# Patient Record
Sex: Female | Born: 1965 | Race: Black or African American | Hispanic: No | Marital: Married | Smoking: Never smoker
Health system: Southern US, Community
[De-identification: ages and names within clinical notes are randomized; demographics above are authoritative.]

## PROBLEM LIST (undated history)

## (undated) DIAGNOSIS — I499 Cardiac arrhythmia, unspecified: Secondary | ICD-10-CM

## (undated) DIAGNOSIS — J45909 Unspecified asthma, uncomplicated: Secondary | ICD-10-CM

## (undated) DIAGNOSIS — M1990 Unspecified osteoarthritis, unspecified site: Secondary | ICD-10-CM

## (undated) DIAGNOSIS — M75100 Unspecified rotator cuff tear or rupture of unspecified shoulder, not specified as traumatic: Secondary | ICD-10-CM

## (undated) DIAGNOSIS — T884XXA Failed or difficult intubation, initial encounter: Secondary | ICD-10-CM

## (undated) HISTORY — DX: Unspecified asthma, uncomplicated: J45.909

## (undated) HISTORY — PX: CARPAL TUNNEL RELEASE: SHX101

## (undated) HISTORY — PX: TONSILLECTOMY: SUR1361

## (undated) HISTORY — PX: COLONOSCOPY: SHX174

## (undated) HISTORY — PX: CHOLECYSTECTOMY: SHX55

## (undated) HISTORY — DX: Unspecified rotator cuff tear or rupture of unspecified shoulder, not specified as traumatic: M75.100

---

## 2007-08-07 ENCOUNTER — Other Ambulatory Visit: Payer: Self-pay

## 2007-08-08 ENCOUNTER — Inpatient Hospital Stay: Payer: Self-pay | Admitting: Surgery

## 2007-08-08 ENCOUNTER — Other Ambulatory Visit: Payer: Self-pay

## 2007-10-12 ENCOUNTER — Emergency Department: Payer: Self-pay | Admitting: Emergency Medicine

## 2007-10-15 ENCOUNTER — Ambulatory Visit: Payer: Self-pay

## 2007-11-03 ENCOUNTER — Ambulatory Visit: Payer: Self-pay | Admitting: Internal Medicine

## 2009-08-03 ENCOUNTER — Ambulatory Visit: Payer: Self-pay

## 2010-06-13 ENCOUNTER — Emergency Department: Payer: Self-pay | Admitting: Unknown Physician Specialty

## 2010-08-01 ENCOUNTER — Ambulatory Visit: Payer: Self-pay

## 2011-08-13 ENCOUNTER — Ambulatory Visit: Payer: Self-pay | Admitting: General Practice

## 2011-09-25 ENCOUNTER — Ambulatory Visit: Payer: Self-pay | Admitting: Specialist

## 2011-10-02 ENCOUNTER — Ambulatory Visit: Payer: Self-pay | Admitting: Specialist

## 2011-10-02 LAB — PREGNANCY, URINE: Pregnancy Test, Urine: NEGATIVE m[IU]/mL

## 2011-11-27 ENCOUNTER — Ambulatory Visit: Payer: Self-pay | Admitting: Specialist

## 2011-11-27 LAB — PREGNANCY, URINE: Pregnancy Test, Urine: NEGATIVE m[IU]/mL

## 2012-06-12 ENCOUNTER — Emergency Department: Payer: Self-pay | Admitting: Emergency Medicine

## 2012-06-12 LAB — CBC
HCT: 38.6 % (ref 35.0–47.0)
HGB: 13 g/dL (ref 12.0–16.0)
MCH: 28.3 pg (ref 26.0–34.0)
MCHC: 33.8 g/dL (ref 32.0–36.0)
RDW: 13.5 % (ref 11.5–14.5)

## 2012-06-12 LAB — COMPREHENSIVE METABOLIC PANEL
Albumin: 3.4 g/dL (ref 3.4–5.0)
Anion Gap: 10 (ref 7–16)
BUN: 6 mg/dL — ABNORMAL LOW (ref 7–18)
Calcium, Total: 9.3 mg/dL (ref 8.5–10.1)
Co2: 26 mmol/L (ref 21–32)
Creatinine: 0.55 mg/dL — ABNORMAL LOW (ref 0.60–1.30)
EGFR (Non-African Amer.): >60
Glucose: 122 mg/dL — ABNORMAL HIGH (ref 65–99)
SGOT(AST): 23 U/L (ref 15–37)
Sodium: 139 mmol/L (ref 136–145)

## 2012-06-26 ENCOUNTER — Ambulatory Visit (INDEPENDENT_AMBULATORY_CARE_PROVIDER_SITE_OTHER): Payer: No Typology Code available for payment source | Admitting: Cardiovascular Disease

## 2012-06-26 ENCOUNTER — Encounter: Payer: Self-pay | Admitting: Cardiovascular Disease

## 2012-06-26 VITALS — BP 112/80 | HR 90 | Ht 63.0 in | Wt 229.5 lb

## 2012-06-26 VITALS — BP 120/70 | HR 100 | Ht 63.0 in | Wt 229.5 lb

## 2012-06-26 DIAGNOSIS — R079 Chest pain, unspecified: Secondary | ICD-10-CM

## 2012-06-26 DIAGNOSIS — R0602 Shortness of breath: Secondary | ICD-10-CM | POA: Insufficient documentation

## 2012-06-26 DIAGNOSIS — R000 Tachycardia, unspecified: Secondary | ICD-10-CM

## 2012-06-26 MED ORDER — METOPROLOL SUCCINATE ER 25 MG PO TB24: 25.0000 mg | ORAL_TABLET | Freq: Every day | ORAL | Status: DC

## 2012-06-26 NOTE — Patient Instructions (Signed)
Your physician has requested that you have an exercise tolerance test. For further information please visit [email or web address]. Please also follow instruction sheet, as given.  Your physician has requested that you have an echocardiogram. Echocardiography is a painless test that uses sound waves to create images of your heart. It provides your doctor with information about the size and shape of your heart and how well your heart's chambers and valves are working. This procedure takes approximately one hour. There are no restrictions for this procedure.   

## 2012-06-26 NOTE — Assessment & Plan Note (Signed)
Her chest pain is overall atypical. There might be a component of anxiety. I recommend a treadmill stress test for evaluation.

## 2012-06-26 NOTE — Assessment & Plan Note (Signed)
The patient's heart rate increased from 88 in the supine position to 108 in the standing position without any change in blood pressure. 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 I advised her to start an exercise program. I will also start her on small dose metoprolol extended release 25 mg once daily.

## 2012-06-26 NOTE — Assessment & Plan Note (Signed)
Patient complains of significant dyspnea. I suspect there is significant physical deconditioning. I will request an echocardiogram to evaluate LV systolic function and pulmonary pressure.

## 2012-06-26 NOTE — Patient Instructions (Addendum)
Your stress test is normal.  Start Toprol 25 mg once daily.  Start an exercise program 5 times a week for at least 30 minutes . Walking is ok to start with.  Follow up after echo.

## 2012-06-26 NOTE — Progress Notes (Signed)
HPI  This is a pleasant 46 year old [place] American female who is here today for cardiac evaluation after recent emergency room visit. She works as a Lawyer at [employer]. She has no previous cardiac history and no chronic medical conditions. She does not take any medications on a regular basis. Recently, she started having episodes of fast heartbeats the maximum heart rate reported of 130 beats per minute. This has been associated with dizziness and increased dyspnea. She also had substernal chest discomfort described as aching sensation happening at rest and lasting for a few minutes. She reports tachycardia with change in position associated with dizziness. She does not exercise on a regular basis. She is not a smoker and has no family history of premature coronary artery disease. She went to the emergency room at [facility] recently and was found to have sinus tachycardia with a heart rate of 112 beats per minute. Had basic labs were unremarkable except for hypokalemia with a potassium of 3. She reports frequent episodes of diarrhea. She might have irritable bowel syndrome. She reports no previous syncope.   No Known Allergies   No current outpatient prescriptions on file prior to visit.     Past Medical History  Diagnosis Date  . Asthma      Past Surgical History  Procedure Date  . Carpal tunnel release     x2  . Tonsillectomy   . Colonoscopy   . Cholecystectomy      Family History  Problem Relation Age of Onset  . Asthma Paternal Grandmother   . Heart attack Paternal Grandmother      History   Social History  . Marital Status: Single    Spouse Name: N/A    Number of Children: N/A  . Years of Education: N/A   Occupational History  . Not on file.   Social History Main Topics  . Smoking status: Never Smoker   . Smokeless tobacco: Not on file  . Alcohol Use: No  . Drug Use: No  . Sexually Active: Not on file   Other Topics Concern  . Not on file   Social History  Narrative  . No narrative on file     ROS Constitutional: Negative for fever, chills, diaphoresis, activity change, appetite change and fatigue.  HENT: Negative for hearing loss, nosebleeds, congestion, sore throat, facial swelling, drooling, trouble swallowing, neck pain, voice change, sinus pressure and tinnitus.  Eyes: Negative for photophobia, pain, discharge and visual disturbance.  Respiratory: Negative for apnea, cough and wheezing.  Cardiovascular: Negative for  leg swelling.  Gastrointestinal: Negative for nausea, vomiting, abdominal pain, diarrhea, constipation, blood in stool and abdominal distention.  Genitourinary: Negative for dysuria, urgency, frequency, hematuria and decreased urine volume.  Musculoskeletal: Negative for myalgias, back pain, joint swelling, arthralgias and gait problem.  Skin: Negative for color change, pallor, rash and wound.  Neurological: Negative for  tremors, seizures, syncope, speech difficulty, weakness, light-headedness, numbness and headaches.  Psychiatric/Behavioral: Negative for suicidal ideas, hallucinations, behavioral problems and agitation. The patient is not nervous/anxious.     PHYSICAL EXAM   BP 120/70  Pulse 100  Ht 5\' 3"  (1.6 m)  Wt 229 lb 8 oz (104.101 kg)  BMI 40.65 kg/m2 Constitutional: She is oriented to person, place, and time. She appears well-developed and well-nourished. No distress.  HENT: No nasal discharge.  Head: Normocephalic and atraumatic.  Eyes: Pupils are equal and round. Right eye exhibits no discharge. Left eye exhibits no discharge.  Neck: Normal range of  motion. Neck supple. No JVD present. No thyromegaly present.  Cardiovascular: Normal rate, regular rhythm, normal heart sounds. Exam reveals no gallop and no friction rub. No murmur heard.  Pulmonary/Chest: Effort normal and breath sounds normal. No stridor. No respiratory distress. She has no wheezes. She has no rales. She exhibits no tenderness.  Abdominal:  Soft. Bowel sounds are normal. She exhibits no distension. There is no tenderness. There is no rebound and no guarding.  Musculoskeletal: Normal range of motion. She exhibits no edema and no tenderness.  Neurological: She is alert and oriented to person, place, and time. Coordination normal.  Skin: Skin is warm and dry. No rash noted. She is not diaphoretic. No erythema. No pallor.  Psychiatric: She has a normal mood and affect. Her behavior is normal. Judgment and thought content normal.     EKG: Sinus  Rhythm  Low voltage in precordial leads.   -Poor R-wave progression -  ABNORMAL    ASSESSMENT AND PLAN

## 2012-06-26 NOTE — Procedures (Signed)
    Treadmill Stress test  Indication: Atypical chest pain.  Baseline Data:  Resting EKG shows NSR with rate of 102 bpm, no significant ST or T wave changes. Resting blood pressure of 112/80 mm Hg Stand bruce protocal was used.  Exercise Data:  Patient exercised for 3 min 42 sec,  Peak heart rate of 174 bpm.  This was 100 % of the maximum predicted heart rate. No symptoms of chest pain or lightheadedness were reported at peak stress or in recovery. She had significant dyspnea. Peak Blood pressure recorded was 146/78 Maximal work level: 4.6 METs.  Heart rate at 3 minutes in recovery was 109 bpm. BP response: Normal. HR response: Accelerated.  EKG with Exercise: Sinus tachycardia with no significant ST changes.  FINAL IMPRESSION: Normal exercise stress test. No significant EKG changes concerning for ischemia. Poor exercise tolerance. Accelerated heart rate response to exercise.  Recommendation: I recommend an exercise program to improve physical deconditioning and starting small dose metoprolol extended release 25 mg once daily.

## 2012-07-07 ENCOUNTER — Other Ambulatory Visit: Payer: No Typology Code available for payment source

## 2012-07-13 ENCOUNTER — Ambulatory Visit: Payer: No Typology Code available for payment source | Admitting: Cardiovascular Disease

## 2012-07-15 ENCOUNTER — Encounter: Payer: Self-pay | Admitting: Cardiovascular Disease

## 2012-07-21 ENCOUNTER — Other Ambulatory Visit (INDEPENDENT_AMBULATORY_CARE_PROVIDER_SITE_OTHER): Payer: No Typology Code available for payment source

## 2012-07-21 ENCOUNTER — Other Ambulatory Visit: Payer: Self-pay

## 2012-07-21 DIAGNOSIS — R000 Tachycardia, unspecified: Secondary | ICD-10-CM

## 2012-07-21 DIAGNOSIS — R0602 Shortness of breath: Secondary | ICD-10-CM

## 2012-07-28 ENCOUNTER — Ambulatory Visit: Payer: No Typology Code available for payment source | Admitting: Cardiovascular Disease

## 2012-08-14 ENCOUNTER — Encounter: Payer: Self-pay | Admitting: Cardiovascular Disease

## 2012-08-14 ENCOUNTER — Ambulatory Visit (INDEPENDENT_AMBULATORY_CARE_PROVIDER_SITE_OTHER): Payer: No Typology Code available for payment source | Admitting: Cardiovascular Disease

## 2012-08-14 VITALS — BP 118/54 | HR 102 | Ht 63.0 in | Wt 220.2 lb

## 2012-08-14 DIAGNOSIS — I272 Pulmonary hypertension, unspecified: Secondary | ICD-10-CM

## 2012-08-14 DIAGNOSIS — G90A Postural orthostatic tachycardia syndrome (POTS): Secondary | ICD-10-CM

## 2012-08-14 DIAGNOSIS — I951 Orthostatic hypotension: Secondary | ICD-10-CM

## 2012-08-14 DIAGNOSIS — I2789 Other specified pulmonary heart diseases: Secondary | ICD-10-CM

## 2012-08-14 DIAGNOSIS — R000 Tachycardia, unspecified: Secondary | ICD-10-CM

## 2012-08-14 DIAGNOSIS — R079 Chest pain, unspecified: Secondary | ICD-10-CM

## 2012-08-14 NOTE — Patient Instructions (Addendum)
Sleep study at [facility].  Start exercising.  Follow up in 6 months.

## 2012-08-16 DIAGNOSIS — G90A Postural orthostatic tachycardia syndrome (POTS): Secondary | ICD-10-CM | POA: Insufficient documentation

## 2012-08-16 DIAGNOSIS — I272 Pulmonary hypertension, unspecified: Secondary | ICD-10-CM | POA: Insufficient documentation

## 2012-08-16 NOTE — Assessment & Plan Note (Signed)
Symptoms improved with the addition of low-dose Toprol. I asked her to increase her fluid intake and avoid sudden standing up. I also recommended starting an exercise program to improve her overall conditioning.

## 2012-08-16 NOTE — Assessment & Plan Note (Signed)
Treadmill stress test showed no evidence of ischemia. She reports no further chest pain.

## 2012-08-16 NOTE — Progress Notes (Signed)
HPI  This is a pleasant 46 year old African American female who is here today for a followup visit regarding tachycardia, dizziness and atypical chest pain. She has no previous cardiac history and no chronic medical conditions. She does not take any medications on a regular basis. Recently, she started having episodes of fast heartbeats the maximum heart rate reported of 130 beats per minute. This has been associated with dizziness and increased dyspnea. She also had substernal chest discomfort described as aching sensation happening at rest and lasting for a few minutes. She reports tachycardia with change in position associated with dizziness. She does not exercise on a regular basis. She is not a smoker and has no family history of premature coronary artery disease. She went to the emergency room at [facility] recently and was found to have sinus tachycardia with a heart rate of 112 beats per minute. Had basic labs were unremarkable except for hypokalemia with a potassium of 3. She reports frequent episodes of diarrhea. She might have irritable bowel syndrome. She reports no previous syncope. During last visit, she was noted to have orthostatic tachycardia without a drop in blood pressure. She underwent a treadmill stress test which showed no evidence of ischemia but there was a very deconditioned heart rate response to exercise. I started her on metoprolol extended release 25 mg once daily. An echocardiogram showed normal LV systolic function with mild pulmonary hypertension. The patient reports symptoms highly suggestive of sleep apnea.   No Known Allergies   Current Outpatient Prescriptions on File Prior to Visit  Medication Sig Dispense Refill  . Cyanocobalamin (B-12 PO) Take by mouth as needed.      . metoprolol succinate (TOPROL XL) 25 MG 24 hr tablet Take 1 tablet (25 mg total) by mouth daily.  30 tablet  6     Past Medical History  Diagnosis Date  . Asthma      Past Surgical History    Procedure Date  . Carpal tunnel release     x2  . Tonsillectomy   . Colonoscopy   . Cholecystectomy      Family History  Problem Relation Age of Onset  . Asthma Paternal Grandmother   . Heart attack Paternal Grandmother      History   Social History  . Marital Status: Single    Spouse Name: N/A    Number of Children: N/A  . Years of Education: N/A   Occupational History  . Not on file.   Social History Main Topics  . Smoking status: Never Smoker   . Smokeless tobacco: Not on file  . Alcohol Use: No  . Drug Use: No  . Sexually Active: Not on file   Other Topics Concern  . Not on file   Social History Narrative  . No narrative on file       PHYSICAL EXAM   BP 118/54  Pulse 102  Ht 5\' 3"  (1.6 m)  Wt 220 lb 4 oz (99.905 kg)  BMI 39.02 kg/m2 Constitutional: She is oriented to person, place, and time. She appears well-developed and well-nourished. No distress.  HENT: No nasal discharge.  Head: Normocephalic and atraumatic.  Eyes: Pupils are equal and round. Right eye exhibits no discharge. Left eye exhibits no discharge.  Neck: Normal range of motion. Neck supple. No JVD present. No thyromegaly present.  Cardiovascular: Normal rate, regular rhythm, normal heart sounds. Exam reveals no gallop and no friction rub. No murmur heard.  Pulmonary/Chest: Effort normal and breath  sounds normal. No stridor. No respiratory distress. She has no wheezes. She has no rales. She exhibits no tenderness.  Abdominal: Soft. Bowel sounds are normal. She exhibits no distension. There is no tenderness. There is no rebound and no guarding.  Musculoskeletal: Normal range of motion. She exhibits no edema and no tenderness.  Neurological: She is alert and oriented to person, place, and time. Coordination normal.  Skin: Skin is warm and dry. No rash noted. She is not diaphoretic. No erythema. No pallor.  Psychiatric: She has a normal mood and affect. Her behavior is normal. Judgment  and thought content normal.     ASSESSMENT AND PLAN

## 2012-08-16 NOTE — Assessment & Plan Note (Signed)
Overall this was mild with an estimated pulmonary artery systolic pressure of 40 mm mercury. She has symptoms highly suggestive of sleep apnea and thus I will request a sleep study.

## 2012-08-25 ENCOUNTER — Telehealth: Payer: Self-pay

## 2012-08-25 NOTE — Telephone Encounter (Signed)
I attempted to reach pt to confirm compliance with sleep study that was ordered at [facility] I cannot find this documented in chart I attempted to call 1st # listed but pt has a "voice mailbox that has not been set up yet" I then tried 2nd # listed. 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

## 2012-08-27 NOTE — Telephone Encounter (Signed)
Pt says sleep lab told her she needs a "note from the doctor". I explained we sent order to them.  I will fax order again and they will be in contact with her. Understanding verb.

## 2012-09-01 ENCOUNTER — Telehealth: Payer: Self-pay

## 2012-09-01 NOTE — Telephone Encounter (Signed)
I called sleep lab at [facility] to check status of test/order that was faxed last week They tell me # they have listed has been disconnected I gave them # we have  They will try this # to reach pt to schedule sleep test

## 2012-09-03 NOTE — Telephone Encounter (Signed)
FYI

## 2012-09-03 NOTE — Telephone Encounter (Signed)
I spoke with [facility] sleep lab They tell me they have spoken to pt re: sleep study and pt says she "will call back after checking her schedule" I will make Dr. Kirke Corin aware

## 2012-09-16 ENCOUNTER — Ambulatory Visit: Payer: Self-pay | Admitting: Cardiovascular Disease

## 2014-06-01 ENCOUNTER — Encounter: Payer: Self-pay | Admitting: General Practice

## 2014-06-26 ENCOUNTER — Encounter: Payer: Self-pay | Admitting: General Practice

## 2014-09-28 ENCOUNTER — Emergency Department: Payer: Self-pay | Admitting: Emergency Medicine

## 2014-10-22 ENCOUNTER — Ambulatory Visit: Payer: Self-pay | Admitting: Specialist

## 2014-11-21 ENCOUNTER — Encounter
Admit: 2014-11-21 | Disposition: A | Discharge status: Home / Self Care | Payer: Self-pay | Attending: Specialist | Admitting: Specialist

## 2014-11-25 ENCOUNTER — Encounter
Admit: 2014-11-25 | Disposition: A | Discharge status: Home / Self Care | Payer: Self-pay | Attending: Specialist | Admitting: Specialist

## 2014-12-18 NOTE — Op Note (Signed)
PATIENT NAME:  Claudia KinsmanWARREN [name], Claudia [name] MR#:  [id number] DATE OF BIRTH:  [birth year]  DATE OF PROCEDURE:  11/27/2011  PREOPERATIVE DIAGNOSIS: Left carpal tunnel syndrome.   POSTOPERATIVE DIAGNOSIS: Left carpal tunnel syndrome.   PROCEDURE: Left carpal tunnel release.   SURGEON: Clare Gandyhristopher E. Rohnan Bartleson, MD    ANESTHESIA: General.   COMPLICATIONS: None.   TOURNIQUET TIME: Approximately 15 minutes.   PROCEDURE: After adequate induction of general anesthesia, the left upper extremity is thoroughly prepped with ChloraPrep and draped in standard sterile fashion. Extremity is wrapped out with the Esmarch bandage and pneumatic tourniquet elevated to 250 mmHg. Under loupe magnification, standard volar carpal tunnel incision is made and the dissection carried down to the transverse retinacular ligament. This is incised in the midportion and the proximal release is performed with the small scissors and the carpal tunnel scissors and the distal release is performed with the small scissors. Careful check is made both proximally and distally to ensure that complete release had been obtained. There is seen to be moderate compression of the median nerve directly beneath the transverse ligament. Wound is thoroughly irrigated multiple times. Skin edges are infiltrated with 0.5% plain Marcaine. Skin is closed with 4-0 nylon. Soft bulky dressing is applied. Tourniquet is released. The patient is returned to the recovery room in satisfactory condition having tolerated the procedure quite well.   ____________________________ Clare Gandyhristopher E. Elke Holtry, MD ces:drc D: 11/27/2011 08:06:23 ET T: 11/27/2011 10:33:05 ET JOB#: [id number]  cc: Clare Gandyhristopher E. Almee Pelphrey, MD, <Dictator> Clare GandyHRISTOPHER E Kwan Shellhammer MD ELECTRONICALLY SIGNED 11/28/2011 13:37

## 2014-12-18 NOTE — Op Note (Signed)
PATIENT NAME:  Claudia [name], Claudia [name] MR#:  [id number] DATE OF BIRTH:  Sep 14, 1965  DATE OF PROCEDURE:  10/02/2011  PREOPERATIVE DIAGNOSIS: Median and ulnar nerve impingement, right  wrist.   POSTOPERATIVE DIAGNOSIS: Median and ulnar nerve impingement, right wrist.   PROCEDURES:  1. Median nerve release, right wrist.  2. Ulnar nerve release, right wrist.   SURGEON: Myra Rudehristopher Sincere Berlanga, M.D.   ANESTHESIA: General.   COMPLICATIONS: None.   TOURNIQUET TIME: Approximately 25 minutes.   DESCRIPTION OF PROCEDURE: After adequate induction of general anesthesia, the right upper extremity is thoroughly prepped with alcohol and DuraPrep and draped in standard sterile fashion. The extremity is wrapped out with the Esmarch bandage and pneumatic tourniquet elevated to 250 mmHg. Under loupe magnification, [name] curved incision is made in the ulnar aspect of the palm almost to the volar wrist crease. The dissection is carefully carried down and the hook of the hamate is palpated. The dissection is first carried toward the thenar eminence and the transverse retinacular ligament is identified. This is incised under direct vision. Carpal tunnel scissors are used to complete the release proximally. There is seen to be severe compression of the median nerve directly beneath the ligament. Dissection is then turned ulnarly, Guyon's canal is identified, and the roof of the canal is completely released. There also is moderate compression of the neurovascular structures in this area. The motor branch is identified and also released at its entrance into the hypothenar space. The wound is thoroughly irrigated multiple times. Skin edges are infiltrated with 0.5% plain Marcaine. The skin is closed with 4-0 nylon. [name]       soft bulky dressing is applied. The tourniquet is released. The patient is returned to the Recovery Room in satisfactory condition having tolerated the procedure quite well.   ____________________________ Clare Gandyhristopher E. Tara Wich, MD ces:slb D: 10/02/2011 08:45:21 ET T: 10/02/2011 09:38:59 ET JOB#: [id number]  cc: Clare Gandyhristopher E. Tifini Reeder, MD, <Dictator> Clare GandyHRISTOPHER E Duston Smolenski MD ELECTRONICALLY SIGNED 10/03/2011 13:46

## 2015-08-09 ENCOUNTER — Other Ambulatory Visit: Payer: Self-pay | Admitting: Orthopedic Surgery

## 2015-08-09 DIAGNOSIS — M25512 Pain in left shoulder: Secondary | ICD-10-CM

## 2015-09-01 ENCOUNTER — Ambulatory Visit: Payer: 59

## 2015-09-01 ENCOUNTER — Ambulatory Visit: Payer: No Typology Code available for payment source

## 2015-09-26 ENCOUNTER — Other Ambulatory Visit: Payer: Self-pay | Admitting: Orthopedic Surgery

## 2015-09-26 ENCOUNTER — Ambulatory Visit
Admission: RE | Admit: 2015-09-26 | Discharge: 2015-09-26 | Disposition: A | Discharge status: Home / Self Care | Payer: 59 | Source: Ambulatory Visit | Attending: Orthopedic Surgery | Admitting: Orthopedic Surgery

## 2015-09-26 DIAGNOSIS — M67814 Other specified disorders of tendon, left shoulder: Secondary | ICD-10-CM | POA: Insufficient documentation

## 2015-09-26 DIAGNOSIS — M19012 Primary osteoarthritis, left shoulder: Secondary | ICD-10-CM | POA: Insufficient documentation

## 2015-09-26 DIAGNOSIS — S46812A Strain of other muscles, fascia and tendons at shoulder and upper arm level, left arm, initial encounter: Secondary | ICD-10-CM | POA: Insufficient documentation

## 2015-09-26 DIAGNOSIS — M25512 Pain in left shoulder: Secondary | ICD-10-CM | POA: Insufficient documentation

## 2015-09-26 DIAGNOSIS — M75102 Unspecified rotator cuff tear or rupture of left shoulder, not specified as traumatic: Secondary | ICD-10-CM | POA: Diagnosis not present

## 2015-09-26 MED ORDER — GADOBENATE DIMEGLUMINE 529 MG/ML IV SOLN
0.1000 mL | Freq: Once | INTRAVENOUS | Status: AC | PRN
  Administered 2015-09-26: 0.1 mL via INTRA_ARTICULAR

## 2015-09-26 MED ORDER — IOHEXOL 300 MG/ML  SOLN
10.0000 mL | Freq: Once | INTRAMUSCULAR | Status: AC | PRN
  Administered 2015-09-26: 10 mL via INTRA_ARTICULAR

## 2015-09-29 DIAGNOSIS — M75121 Complete rotator cuff tear or rupture of right shoulder, not specified as traumatic: Secondary | ICD-10-CM | POA: Diagnosis not present

## 2015-10-23 ENCOUNTER — Other Ambulatory Visit: Payer: Self-pay | Admitting: Orthopedic Surgery

## 2015-10-24 ENCOUNTER — Encounter: Payer: Self-pay | Admitting: *Deleted

## 2015-10-24 ENCOUNTER — Encounter
Admission: RE | Admit: 2015-10-24 | Discharge: 2015-10-24 | Disposition: A | Discharge status: Home / Self Care | Payer: 59 | Source: Ambulatory Visit | Attending: Orthopedic Surgery | Admitting: Orthopedic Surgery

## 2015-10-24 DIAGNOSIS — I472 Ventricular tachycardia: Secondary | ICD-10-CM | POA: Diagnosis not present

## 2015-10-24 DIAGNOSIS — Z01810 Encounter for preprocedural cardiovascular examination: Secondary | ICD-10-CM | POA: Insufficient documentation

## 2015-10-24 LAB — CBC WITH DIFFERENTIAL/PLATELET
BASOS PCT: 1 %
Basophils Absolute: 0 10*3/uL (ref 0–0.1)
EOS ABS: 0.1 10*3/uL (ref 0–0.7)
Eosinophils Relative: 2 %
HEMATOCRIT: 37 % (ref 35.0–47.0)
HEMOGLOBIN: 12.3 g/dL (ref 12.0–16.0)
LYMPHS PCT: 45 %
Lymphs Abs: 2.8 10*3/uL (ref 1.0–3.6)
MCH: 28.9 pg (ref 26.0–34.0)
MCHC: 33.3 g/dL (ref 32.0–36.0)
MCV: 86.9 fL (ref 80.0–100.0)
MONO ABS: 0.6 10*3/uL (ref 0.2–0.9)
MONOS PCT: 9 %
NEUTROS ABS: 2.7 10*3/uL (ref 1.4–6.5)
NEUTROS PCT: 43 %
Platelets: 231 10*3/uL (ref 150–440)
RBC: 4.26 MIL/uL (ref 3.80–5.20)
RDW: 14 % (ref 11.5–14.5)
WBC: 6.3 10*3/uL (ref 3.6–11.0)

## 2015-10-24 LAB — BASIC METABOLIC PANEL
ANION GAP: 6 (ref 5–15)
BUN: 7 mg/dL (ref 6–20)
CO2: 27 mmol/L (ref 22–32)
Calcium: 9.1 mg/dL (ref 8.9–10.3)
Chloride: 106 mmol/L (ref 101–111)
Creatinine, Ser: 0.54 mg/dL (ref 0.44–1.00)
GLUCOSE: 108 mg/dL — AB (ref 65–99)
POTASSIUM: 3.1 mmol/L — AB (ref 3.5–5.1)
Sodium: 139 mmol/L (ref 135–145)

## 2015-10-24 LAB — APTT: aPTT: 33 seconds (ref 24–36)

## 2015-10-24 LAB — PROTIME-INR
INR: 0.93
Prothrombin Time: 12.7 seconds (ref 11.4–15.0)

## 2015-10-24 NOTE — Pre-Procedure Instructions (Signed)
Spoke with Martie Lee @ Dr Sheilah Pigeon office and informed her that pt is needing cardiac clearance per anesthesia.  Pt had seen Dr Kirke Corin in 2013 but Martie Lee states that Dr Kirke Corin has a very busy schedule and she wants me to fax over the EKG to their office so she can show him the EKG and see where they need to place pt.  I faxed over EKG along with clearance sheet and received fax confirmation

## 2015-10-24 NOTE — Pre-Procedure Instructions (Signed)
Also called Cordelia Pen at Dr Justine Null office and left her a message that pt is needing cardiac clearance and that I am in the process of coordinating that with Dr Jari Sportsman office.

## 2015-10-24 NOTE — Pre-Procedure Instructions (Signed)
CALLED DR Henrene Hawking REGARDING ABNORMAL EKG-DR KEPHART WANTS PT TO GET CARDIAC CLEARANCE BEFORE HER SHOULDER SURGERY

## 2015-10-24 NOTE — Patient Instructions (Signed)
  Your procedure is scheduled on: 10-31-15 (TUESDAY) Report to [facility] SAME DAY SURGERY 2ND FLOOR To find out your arrival time please call [phone] between 1PM - 3PM on 10-30-15 (MONDAY)  Remember: Instructions that are not followed completely may result in serious medical risk, up to and including death, or upon the discretion of your surgeon and anesthesiologist your surgery may need to be rescheduled.    _X___ 1. Do not eat food or drink liquids after midnight. No gum chewing or hard candies.     _X___ 2. No Alcohol for 24 hours before or after surgery.   ____ 3. Bring all medications with you on the day of surgery if instructed.    _X___ 4. Notify your doctor if there is any change in your medical condition     (cold, fever, infections).     Do not wear jewelry, make-up, hairpins, clips or nail polish.  Do not wear lotions, powders, or perfumes. You may wear deodorant.  Do not shave 48 hours prior to surgery. Men may shave face and neck.  Do not bring valuables to the hospital.    [facility] is not responsible for any belongings or valuables.               Contacts, dentures or bridgework may not be worn into surgery.  Leave your suitcase in the car. After surgery it may be brought to your room.  For patients admitted to the hospital, discharge time is determined by your treatment team.   Patients discharged the day of surgery will not be allowed to drive home.   Please read over the following fact sheets that you were given:     ____ Take these medicines the morning of surgery with A SIP OF WATER:    1. NONE  2.   3.   4.  5.  6.  ____ Fleet Enema (as directed)   _X___ Use CHG Soap as directed  ____ Use inhalers on the day of surgery  ____ Stop metformin 2 days prior to surgery    ____ Take 1/2 of usual insulin dose the night before surgery and none on the morning of surgery.   ____ Stop Coumadin/Plavix/aspirin-N/A  ____ Stop Anti-inflammatories-NO  NSAIDS OR ASPIRIN PRODUCTS-TYLENOL OK TO CONTINUE   ____ Stop supplements until after surgery.    ____ Bring C-Pap to the hospital.

## 2015-10-24 NOTE — Pre-Procedure Instructions (Signed)
CALLED Claudia [name] AND LET HER KNOW THAT SHE HAS AN APPT WITH DR Kirke Corin ON Thursday 10-26-15 @ 11:45 AND SHE IS TO CHECK IN AT 11:30 SINCE SHE HAS NOT SEEN DR Kirke Corin SINCE 2013. NOTIFIED HER OF HER LOW K AND THAT SHE WILL BE GETTING A CALL FROM DR KS OFFICE TO START HER ON A SUPPLEMENT.  ALSO CALLED [name] AT DR Justine Null OFFICE AND NOTIFIED HER OF PTS CARDIOLOGY APPT ON 3-2 AND THAT PT HAS A LOW K=-FAXED ALL THIS INFO OVER TO [facility] WITH A FAX CONFIRMATION RECEIVED

## 2015-10-26 ENCOUNTER — Encounter: Payer: Self-pay | Admitting: Cardiovascular Disease

## 2015-10-26 ENCOUNTER — Telehealth: Payer: Self-pay | Admitting: Cardiovascular Disease

## 2015-10-26 ENCOUNTER — Ambulatory Visit (INDEPENDENT_AMBULATORY_CARE_PROVIDER_SITE_OTHER): Payer: 59 | Admitting: Cardiovascular Disease

## 2015-10-26 VITALS — BP 130/70 | HR 73 | Ht 63.0 in | Wt 239.2 lb

## 2015-10-26 DIAGNOSIS — Z01818 Encounter for other preprocedural examination: Secondary | ICD-10-CM | POA: Diagnosis not present

## 2015-10-26 NOTE — Progress Notes (Signed)
Cardiology Office Note   Date:  10/26/2015   ID:  Claudia [name], DOB 1966-06-29, MRN [id number]  PCP:  No PCP Per Patient  Cardiologist:   Lorine Bears, MD   Chief Complaint  Patient presents with  . Other    LS 2013 needs cardiac clearance for rotator cuff surgery c/o low potassium and abn ekg. Meds reviewed verbally with pt.      History of Present Illness: Claudia [name] is a 50 y.o. female who was referred by Dr. Martha Clan  for preoperative cardiovascular evaluation before shoulder surgery which is scheduled next week. 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 Workup in 2013 included a treadmill stress test which was normal. Echocardiogram showed normal LV systolic function with mild pulmonary hypertension.  She was treated with metoprolol with improvement in symptoms. She came off the medication after a few months with resolution of symptoms. She was noted recently to have a slightly abnormal EKG with anterior T wave changes and thus she was referred for preoperative cardiovascular evaluation. She denies any chest pain, orthopnea, PND or palpitations. She has chronic exertional dyspnea with no significant worsening recently. She is not a smoker and has no family history of coronary artery disease.  Past Medical History  Diagnosis Date  . Asthma     H/O YEARS AGO-NO INHALERS  . Arthritis   . Dysrhythmia     H/O [facility] PROBLEMS SINCE 2013  . Rotator cuff tear     left    Past Surgical History  Procedure Laterality Date  . Carpal tunnel release      x2  . Tonsillectomy    . Colonoscopy    . Cholecystectomy       Current Outpatient Prescriptions  Medication Sig Dispense Refill  . acetaminophen (TYLENOL) 500 MG tablet Take 500 mg by mouth every 6 (six) hours as needed.     No current facility-administered medications for this visit.    Allergies:   Review of  patient's allergies indicates no known allergies.    Social History:  The patient  reports that she has never smoked. She does not have any smokeless tobacco history on file. She reports that she does not drink alcohol or use illicit drugs.   Family History:  The patient's family history includes Asthma in her paternal grandmother; Heart attack in her paternal grandmother.    ROS:  Please see the history of present illness.   Otherwise, review of systems are positive for none.   All other systems are reviewed and negative.    PHYSICAL EXAM: VS:  BP 130/70 mmHg  Pulse 73  Ht  (1.6 m)  Wt 239 lb 4 oz (108.523 kg)  BMI 42.39 kg/m2  LMP 10/21/2015 , BMI Body mass index is 42.39 kg/(m^2). GEN: Well nourished, well developed, in no acute distress HEENT: normal Neck: no JVD, carotid bruits, or masses Cardiac: RRR; no murmurs, rubs, or gallops,no edema  Respiratory:  clear to auscultation bilaterally, normal work of breathing GI: soft, nontender, nondistended, + BS MS: no deformity or atrophy Skin: warm and dry, no rash Neuro:  Strength and sensation are intact Psych: euthymic mood, full affect   EKG:  EKG is ordered today. The ekg ordered today demonstrates normal sinus rhythm with minimal LVH criteria and nonspecific T wave changes in the anterior leads.   Recent Labs: 10/24/2015: BUN 7; Creatinine, Ser 0.54; Hemoglobin 12.3; Platelets  231; Potassium 3.1*; Sodium 139    Lipid Panel No results found for: CHOL, TRIG, HDL, CHOLHDL, VLDL, LDLCALC, LDLDIRECT    Wt Readings from Last 3 Encounters:  10/26/15 239 lb 4 oz (108.523 kg)  10/24/15 250 lb (113.399 kg)  08/14/12 220 lb 4 oz (99.905 kg)         ASSESSMENT AND PLAN:  1.  Preoperative cardiovascular evaluation: The patient currently has no convincing symptoms of angina, heart failure or arrhythmia. Her functional capacity is reasonable. Only symptoms include mild exertional dyspnea which has been chronic. EKG is  slightly abnormal and likely reflects some degree of left ventricular hypertrophy likely related to obesity. She did have mild pulmonary hypertension in 2013 and thus I requested an echocardiogram to ensure no worsening. If echocardiogram is unremarkable, the patient can proceed with surgery with with an overall low risk. I don't think she requires ischemic cardiac evaluation.  2.. Previous palpitations and sinus tachycardia: This has resolved completely and currently she is not on any medication.  3. The patient has been advised in the past to schedule a sleep study given symptoms of sleep apnea.      Disposition:   FU with me in as needed.   Signed, Lorine Bears, MD  10/26/2015 12:26 PM    [facility]

## 2015-10-26 NOTE — Patient Instructions (Signed)
Medication Instructions:  Your physician recommends that you continue on your current medications as directed. Please refer to the Current Medication list given to you today.   Labwork: none  Testing/Procedures: Your physician has requested that you have an echocardiogram. Echocardiography is a painless test that uses sound waves to create images of your heart. It provides your doctor with information about the size and shape of your heart and how well your heart's chambers and valves are working. This procedure takes approximately one hour. There are no restrictions for this procedure.    Follow-Up: Your physician recommends that you schedule a follow-up appointment as needed with Dr. Arida.    Any Other Special Instructions Will Be Listed Below (If Applicable).     If you need a refill on your cardiac medications before your next appointment, please call your pharmacy.  Echocardiogram An echocardiogram, or echocardiography, uses sound waves (ultrasound) to produce an image of your heart. The echocardiogram is simple, painless, obtained within a short period of time, and offers valuable information to your health care provider. The images from an echocardiogram can provide information such as:  Evidence of coronary artery disease (CAD).  Heart size.  Heart muscle function.  Heart valve function.  Aneurysm detection.  Evidence of a past heart attack.  Fluid buildup around the heart.  Heart muscle thickening.  Assess heart valve function. LET YOUR HEALTH CARE PROVIDER KNOW ABOUT:  Any allergies you have.  All medicines you are taking, including vitamins, herbs, eye drops, creams, and over-the-counter medicines.  Previous problems you or members of your family have had with the use of anesthetics.  Any blood disorders you have.  Previous surgeries you have had.  Medical conditions you have.  Possibility of pregnancy, if this applies. BEFORE THE PROCEDURE  No  special preparation is needed. Eat and drink normally.  PROCEDURE   In order to produce an image of your heart, gel will be applied to your chest and a wand-like tool (transducer) will be moved over your chest. The gel will help transmit the sound waves from the transducer. The sound waves will harmlessly bounce off your heart to allow the heart images to be captured in real-time motion. These images will then be recorded.  You may need an IV to receive a medicine that improves the quality of the pictures. AFTER THE PROCEDURE You may return to your normal schedule including diet, activities, and medicines, unless your health care provider tells you otherwise.   This information is not intended to replace advice given to you by your health care provider. Make sure you discuss any questions you have with your health care provider.   Document Released: 08/09/2000 Document Revised: 09/02/2014 Document Reviewed: 04/19/2013 Elsevier Interactive Patient Education 2016 [employer].  

## 2015-10-26 NOTE — Telephone Encounter (Signed)
Left detailed message regarding echo appt on pt cell VM

## 2015-10-26 NOTE — Telephone Encounter (Signed)
Pt seen in the office today. Needs echo for pre-operative clearance. Surgery scheduled for March 7 Scheduled at [facility] March 3, 10am, arrival 9:45am Left message on pt cell VM to call back.

## 2015-10-27 ENCOUNTER — Ambulatory Visit
Admission: RE | Admit: 2015-10-27 | Discharge: 2015-10-27 | Disposition: A | Discharge status: Home / Self Care | Payer: 59 | Source: Ambulatory Visit | Attending: Cardiovascular Disease | Admitting: Cardiovascular Disease

## 2015-10-27 ENCOUNTER — Telehealth: Payer: Self-pay | Admitting: Cardiovascular Disease

## 2015-10-27 DIAGNOSIS — Z01818 Encounter for other preprocedural examination: Secondary | ICD-10-CM

## 2015-10-27 DIAGNOSIS — Z01810 Encounter for preprocedural cardiovascular examination: Secondary | ICD-10-CM | POA: Diagnosis not present

## 2015-10-27 NOTE — Telephone Encounter (Signed)
Left message for Cordelia PenSherry at [facility]. Pt had echo today, awaiting results to be read.

## 2015-10-27 NOTE — Telephone Encounter (Signed)
Please Claudia [name] with Dr. Samuel GermanyKrasinski's office needs cardiac clearance stat?  Request for surgical clearance:  What type of surgery is being performed? L shoulder 1. When is this surgery scheduled? Tues. 10/31/15  Are there any medications that need to be held prior to surgery and how long? 2. Name of physician performing surgery? Dr. Samuel GermanyKrasinski's office  3. What is your office phone and fax number? [phone]  fax [phone] 4.

## 2015-10-27 NOTE — Progress Notes (Signed)
*  PRELIMINARY RESULTS* Echocardiogram 2D Echocardiogram has been performed.  Georgann HousekeeperJerry R Hege 10/27/2015, 10:28 AM

## 2015-10-30 ENCOUNTER — Other Ambulatory Visit: Payer: Self-pay | Admitting: Orthopedic Surgery

## 2015-10-30 NOTE — Pre-Procedure Instructions (Signed)
Kirke CorinArida, MD on 10/27/2015 at 6:40 PM Inform patient that echo was fine. She is at low risk for surgery.    PACS Images    Show images for Echocardiogram    Narrative            *[facility]*           [address]            [place], [name] [address]              [phone]  ------------------------------------------------------------------- Transthoracic Echocardiography  Patient:  Claudia [name], Claudia [name] MR #:    [id number] Study Date: 10/27/2015 Gender:   F Age:    50 Height:   160 cm Weight:   108.5 kg BSA:    2.26 m^2 Pt. Status: Room:  ATTENDING  Lorine BearsMuhammad Arida, MD ORDERING   Lorine BearsMuhammad Arida, MD SONOGRAPHER Cristela BlueJerry Hege RDCS PERFORMING  [facility]  cc:  ------------------------------------------------------------------- LV EF: 50% -  55%  ------------------------------------------------------------------- Indications:   Pre-operative cardiovascular exam V72.81.  ------------------------------------------------------------------- Study Conclusions  - Left ventricle: The cavity size was normal. There was mild concentric hypertrophy. Systolic function was normal. The estimated ejection fraction was in the range of 50% to 55%. Doppler parameters are consistent with abnormal left ventricular relaxation (grade 1 diastolic dysfunction). - Left atrium: The atrium was mildly dilated. - Pulmonary arteries: Systolic pressure could not be accurately estimated.  Impressions:  - suboptimal study due to poor windows.  Transthoracic echocardiography. M-mode, complete 2D, spectral Doppler, and color Doppler. Birthdate: Patient birthdate: 1966/08/02. Age: Patient is 50 yr old. Sex: Gender: female. BMI: 42.4 kg/m^2. Patient status: Outpatient. Study date: Study date: 10/27/2015. Study time: 09:42  AM.  -------------------------------------------------------------------  ------------------------------------------------------------------- Left ventricle: The cavity size was normal. There was mild concentric hypertrophy. Systolic function was normal. The estimated ejection fraction was in the range of 50% to 55%. Images were inadequate for LV wall motion assessment. Doppler parameters are consistent with abnormal left ventricular relaxation (grade 1 diastolic dysfunction).  ------------------------------------------------------------------- Aortic valve:  Trileaflet; normal thickness leaflets. Mobility was not restricted. Doppler: Transvalvular velocity was within the normal range. There was no stenosis. There was no regurgitation. Peak velocity ratio of LVOT to aortic valve: 0.87. Valve area (Vmax): 3.01 cm^2. Indexed valve area (Vmax): 1.33 cm^2/m^2. Peak gradient (S): 4 mm Hg.  ------------------------------------------------------------------- Aorta: Aortic root: The aortic root was normal in size.  ------------------------------------------------------------------- Mitral valve:  Structurally normal valve.  Mobility was not restricted. Doppler: Transvalvular velocity was within the normal range. There was no evidence for stenosis. There was no regurgitation.  Peak gradient (D): 2 mm Hg.  ------------------------------------------------------------------- Left atrium: The atrium was mildly dilated.  ------------------------------------------------------------------- Right ventricle: The cavity size was normal. Wall thickness was normal. Systolic function was normal.  ------------------------------------------------------------------- Pulmonic valve:  Doppler: Transvalvular velocity was within the normal range. There was no evidence for stenosis.  ------------------------------------------------------------------- Tricuspid valve:  Structurally normal  valve.  Doppler: Transvalvular velocity was within the normal range. There was no regurgitation.  ------------------------------------------------------------------- Pulmonary artery:  The main pulmonary artery was normal-sized. Systolic pressure could not be accurately estimated.  ------------------------------------------------------------------- Right atrium: The atrium was normal in size.  ------------------------------------------------------------------- Pericardium: There was no pericardial effusion.  ------------------------------------------------------------------- Systemic veins: Inferior vena cava: Not visualized.  ------------------------------------------------------------------- Measurements  Left ventricle              Value     Reference LV ID, ED, PLAX chordal  48  mm    43 - 52 LV ID, ES, PLAX chordal          34.2 mm    23 - 38 LV fx shortening, PLAX chordal      29  %    >=29 LV PW thickness, ED            11.9 mm    --------- IVS/LV PW ratio, ED            0.91      <=1.3 LV e&', lateral              8.38 cm/s   --------- LV E/e&', lateral             8.85      --------- LV e&', medial               7.07 cm/s   --------- LV E/e&', medial              10.5      --------- LV e&', average              7.73 cm/s   --------- LV E/e&', average             9.61      ---------  Ventricular septum            Value     Reference IVS thickness, ED             10.8 mm    ---------  LVOT                   Value     Reference LVOT ID, S                21  mm    --------- LVOT area                 3.46 cm^2   --------- LVOT peak velocity, S            85.4 cm/s   ---------  Aortic valve               Value     Reference Aortic valve peak velocity, S       98.2 cm/s   --------- Aortic peak gradient, S          4   mm Hg  --------- Velocity ratio, peak, LVOT/AV       0.87      --------- Aortic valve area, peak velocity     3.01 cm^2   --------- Aortic valve area/bsa, peak        1.33 cm^2/m^2 --------- velocity  Aorta                   Value     Reference Aortic root ID, ED            24  mm    ---------  Left atrium                Value     Reference LA ID, [name]-P, ES              34  mm    --------- LA ID/bsa, [name]-P              1.51 cm/m^2  <=2.2 LA volume, S               58.8 ml    ---------  LA volume/bsa, S             26.1 ml/m^2  --------- LA volume, ES, 1-p A4C          58.1 ml    --------- LA volume/bsa, ES, 1-p A4C        25.8 ml/m^2  --------- LA volume, ES, 1-p A2C          56.5 ml    --------- LA volume/bsa, ES, 1-p A2C        25  ml/m^2  ---------  Mitral valve               Value     Reference Mitral E-wave peak velocity        74.2 cm/s   --------- Mitral [name]-wave peak velocity        95.6 cm/s   --------- Mitral deceleration time         197  ms    150 - 230 Mitral peak gradient, D          2   mm Hg  --------- Mitral E/[name] ratio, peak          0.8      ---------  Right ventricle              Value     Reference RV ID, ED, PLAX              30.6 mm    19 - 38 TAPSE                   28  mm    ---------  Pulmonic valve              Value     Reference Pulmonic valve peak  velocity, S      58.2 cm/s   ---------  Legend: (L) and (H) mark values outside specified reference range.  ------------------------------------------------------------------- Prepared and Electronically Authenticated by  Lorine Bears, MD 2017-03-03T12:55:54    Congenital Measurements       Z-score normal range: +/-2   BSA formula: Ripley Fraise Documents:    There are no order-level documents.    Encounter-Level Documents - 10/27/2015:      Electronic signature on 10/27/2015 9:35 AM    Signed    Electronically signed by Iran Ouch, MD on 10/27/15 at 1255 EST    Printable Result Report    Result Report    Echocardiogram (Order [id number])  Echocardiography  Order: [id number]   Released By: Rockne Menghini  Authorizing: Iran Ouch, MD   Date: 10/27/2015  Department: [facility]       Procedure Abnormality Status    Echocardiogram      Order Information     Order Date/Time Release Date/Time Start Date/Time End Date/Time    10/27/15 09:38 AM 10/27/15 09:38 AM 10/27/15 09:38 AM 10/27/2015      Order Details     Frequency Duration Priority Order Class    As needed 1 occurrence Routine Ancillary Performed      Acc#    [id number]      Order Questions     Question Answer Comment    Where should this test be performed [facility]     Complete or Limited study? Complete     With Image Enhancing Agent or without Image Enhancing Agent? With Image Enhancing Agent     Note:  Select Without Image Enhancing Agent  only if contraindicated.    Expected Date: ASAP     Reason for exam-Echo Pre-operative cardiovascular examination V72.81 / Z01.810       Associated Diagnoses       ICD-9-CM ICD-10-CM    Preoperative clearance    V72.84 Z01.818      Order History  Inpatient    Date/Time Action Taken User Additional Information    0000 Result  Margreta Journey In process    10/27/15 1610 Release Rockne Menghini [id number]    10/27/15 1255 Result Rad Results In Interface Final      Verbal Order Info     Action Created on Order Mode Entered by Responsible Provider Signed by Signed on    Ordering 10/26/15 1155 Verbal with Read Back: Cosign Required Shon Baton, RN Iran Ouch, MD Iran Ouch, MD 10/27/15 0801      Order Requisition     Echocardiogram (Order #[id number]) on 10/27/15     Collection Information     Collected: 10/27/2015 9:42 AM   Resulting Agency: [facility]       View SmartLink Info     Echocardiogram (Order #[id number]) on 10/27/15

## 2015-10-30 NOTE — Pre-Procedure Instructions (Signed)
CARDIAC CLEARANCE RECEIVED FROM DR ARIDAS OFFICE AND WAS FAXED TO [name] AT [facility] WITH FAX CONFIRMATION RECEIVED

## 2015-10-30 NOTE — Telephone Encounter (Signed)
Clearance faxed to [facility], [phone] and [facility], [phone]

## 2015-10-31 ENCOUNTER — Ambulatory Visit: Payer: 59 | Admitting: Anesthesiology

## 2015-10-31 ENCOUNTER — Ambulatory Visit
Admission: RE | Admit: 2015-10-31 | Discharge: 2015-10-31 | Disposition: A | Discharge status: Home / Self Care | Payer: 59 | Source: Ambulatory Visit | Attending: Orthopedic Surgery | Admitting: Orthopedic Surgery

## 2015-10-31 ENCOUNTER — Encounter
Admission: RE | Disposition: A | Discharge status: Home / Self Care | Payer: Self-pay | Source: Ambulatory Visit | Attending: Orthopedic Surgery

## 2015-10-31 DIAGNOSIS — Z8249 Family history of ischemic heart disease and other diseases of the circulatory system: Secondary | ICD-10-CM | POA: Diagnosis not present

## 2015-10-31 DIAGNOSIS — Z7982 Long term (current) use of aspirin: Secondary | ICD-10-CM | POA: Diagnosis not present

## 2015-10-31 DIAGNOSIS — M66822 Spontaneous rupture of other tendons, left upper arm: Secondary | ICD-10-CM | POA: Diagnosis not present

## 2015-10-31 DIAGNOSIS — M1990 Unspecified osteoarthritis, unspecified site: Secondary | ICD-10-CM | POA: Insufficient documentation

## 2015-10-31 DIAGNOSIS — Z825 Family history of asthma and other chronic lower respiratory diseases: Secondary | ICD-10-CM | POA: Insufficient documentation

## 2015-10-31 DIAGNOSIS — S46812A Strain of other muscles, fascia and tendons at shoulder and upper arm level, left arm, initial encounter: Secondary | ICD-10-CM | POA: Insufficient documentation

## 2015-10-31 DIAGNOSIS — J45909 Unspecified asthma, uncomplicated: Secondary | ICD-10-CM | POA: Insufficient documentation

## 2015-10-31 DIAGNOSIS — Z79899 Other long term (current) drug therapy: Secondary | ICD-10-CM | POA: Insufficient documentation

## 2015-10-31 DIAGNOSIS — Z9049 Acquired absence of other specified parts of digestive tract: Secondary | ICD-10-CM | POA: Insufficient documentation

## 2015-10-31 DIAGNOSIS — M25512 Pain in left shoulder: Secondary | ICD-10-CM | POA: Diagnosis not present

## 2015-10-31 DIAGNOSIS — Y939 Activity, unspecified: Secondary | ICD-10-CM | POA: Insufficient documentation

## 2015-10-31 DIAGNOSIS — Y929 Unspecified place or not applicable: Secondary | ICD-10-CM | POA: Insufficient documentation

## 2015-10-31 DIAGNOSIS — Z5333 Arthroscopic surgical procedure converted to open procedure: Secondary | ICD-10-CM | POA: Diagnosis not present

## 2015-10-31 DIAGNOSIS — G8918 Other acute postprocedural pain: Secondary | ICD-10-CM | POA: Diagnosis not present

## 2015-10-31 DIAGNOSIS — S43082A Other subluxation of left shoulder joint, initial encounter: Secondary | ICD-10-CM | POA: Insufficient documentation

## 2015-10-31 DIAGNOSIS — X58XXXA Exposure to other specified factors, initial encounter: Secondary | ICD-10-CM | POA: Diagnosis not present

## 2015-10-31 DIAGNOSIS — M75122 Complete rotator cuff tear or rupture of left shoulder, not specified as traumatic: Secondary | ICD-10-CM | POA: Diagnosis not present

## 2015-10-31 DIAGNOSIS — Z6841 Body Mass Index (BMI) 40.0 and over, adult: Secondary | ICD-10-CM | POA: Insufficient documentation

## 2015-10-31 HISTORY — DX: Failed or difficult intubation, initial encounter: T88.4XXA

## 2015-10-31 HISTORY — DX: Unspecified osteoarthritis, unspecified site: M19.90

## 2015-10-31 HISTORY — DX: Cardiac arrhythmia, unspecified: I49.9

## 2015-10-31 HISTORY — PX: SHOULDER ARTHROSCOPY WITH OPEN ROTATOR CUFF REPAIR: SHX6092

## 2015-10-31 LAB — POCT I-STAT 4, (NA,K, GLUC, HGB,HCT)
Glucose, Bld: 104 mg/dL — ABNORMAL HIGH (ref 65–99)
HEMATOCRIT: 38 % (ref 36.0–46.0)
HEMOGLOBIN: 12.9 g/dL (ref 12.0–15.0)
Potassium: 3.7 mmol/L (ref 3.5–5.1)
SODIUM: 137 mmol/L (ref 135–145)

## 2015-10-31 LAB — BASIC METABOLIC PANEL
Anion gap: 8 (ref 5–15)
BUN: 6 mg/dL (ref 6–20)
CHLORIDE: 105 mmol/L (ref 101–111)
CO2: 24 mmol/L (ref 22–32)
CREATININE: 0.49 mg/dL (ref 0.44–1.00)
Calcium: 9 mg/dL (ref 8.9–10.3)
GFR calc Af Amer: >60 mL/min (ref >60)
GFR calc non Af Amer: >60 mL/min (ref >60)
GLUCOSE: 106 mg/dL — AB (ref 65–99)
Potassium: 3.7 mmol/L (ref 3.5–5.1)
Sodium: 137 mmol/L (ref 135–145)

## 2015-10-31 LAB — POCT PREGNANCY, URINE: Preg Test, Ur: NEGATIVE

## 2015-10-31 SURGERY — ARTHROSCOPY, SHOULDER WITH REPAIR, ROTATOR CUFF, OPEN
Anesthesia: General | Site: Shoulder | Laterality: Left | Wound class: Clean

## 2015-10-31 MED ORDER — IPRATROPIUM-ALBUTEROL 0.5-2.5 (3) MG/3ML IN SOLN
RESPIRATORY_TRACT | Status: AC
  Administered 2015-10-31: 3 mL via RESPIRATORY_TRACT
  Filled 2015-10-31: qty 3

## 2015-10-31 MED ORDER — ONDANSETRON HCL 4 MG/2ML IJ SOLN
INTRAMUSCULAR | Status: AC
  Filled 2015-10-31: qty 2

## 2015-10-31 MED ORDER — ACETAMINOPHEN 10 MG/ML IV SOLN
INTRAVENOUS | Status: AC
  Filled 2015-10-31: qty 100

## 2015-10-31 MED ORDER — GLYCOPYRROLATE 0.2 MG/ML IJ SOLN
INTRAMUSCULAR | Status: DC | PRN
  Administered 2015-10-31: 0.6 mg via INTRAVENOUS

## 2015-10-31 MED ORDER — FAMOTIDINE 20 MG PO TABS
20.0000 mg | ORAL_TABLET | Freq: Once | ORAL | Status: AC
  Administered 2015-10-31: 20 mg via ORAL

## 2015-10-31 MED ORDER — ROPIVACAINE HCL 5 MG/ML IJ SOLN
INTRAMUSCULAR | Status: DC | PRN
  Administered 2015-10-31: 5 mL via PERINEURAL
  Administered 2015-10-31: 10 mL via PERINEURAL
  Administered 2015-10-31: 5 mL via PERINEURAL

## 2015-10-31 MED ORDER — MIDAZOLAM HCL 2 MG/2ML IJ SOLN
INTRAMUSCULAR | Status: DC | PRN
  Administered 2015-10-31 (×2): 0.5 mg via INTRAVENOUS
  Administered 2015-10-31: 1 mg via INTRAVENOUS

## 2015-10-31 MED ORDER — NEOMYCIN-POLYMYXIN B GU 40-200000 IR SOLN
Status: AC
  Filled 2015-10-31: qty 2

## 2015-10-31 MED ORDER — EPINEPHRINE HCL 1 MG/ML IJ SOLN
INTRAMUSCULAR | Status: AC
  Filled 2015-10-31: qty 1

## 2015-10-31 MED ORDER — ONDANSETRON HCL 4 MG PO TABS: 4.0000 mg | ORAL_TABLET | Freq: Three times a day (TID) | ORAL | Status: AC | PRN

## 2015-10-31 MED ORDER — LIDOCAINE HCL (CARDIAC) 20 MG/ML IV SOLN
INTRAVENOUS | Status: DC | PRN
  Administered 2015-10-31: 30 mg via INTRAVENOUS

## 2015-10-31 MED ORDER — FENTANYL CITRATE (PF) 100 MCG/2ML IJ SOLN
25.0000 ug | INTRAMUSCULAR | Status: DC | PRN
  Administered 2015-10-31 (×4): 25 ug via INTRAVENOUS

## 2015-10-31 MED ORDER — LIDOCAINE HCL (PF) 1 % IJ SOLN
INTRAMUSCULAR | Status: AC
  Filled 2015-10-31: qty 30

## 2015-10-31 MED ORDER — ROCURONIUM BROMIDE 100 MG/10ML IV SOLN
INTRAVENOUS | Status: DC | PRN
  Administered 2015-10-31 (×2): 10 mg via INTRAVENOUS
  Administered 2015-10-31: 40 mg via INTRAVENOUS

## 2015-10-31 MED ORDER — FENTANYL CITRATE (PF) 100 MCG/2ML IJ SOLN
INTRAMUSCULAR | Status: AC
  Administered 2015-10-31: 25 ug via INTRAVENOUS
  Filled 2015-10-31: qty 2

## 2015-10-31 MED ORDER — PHENYLEPHRINE HCL 10 MG/ML IJ SOLN
INTRAMUSCULAR | Status: DC | PRN
  Administered 2015-10-31: 100 ug via INTRAVENOUS

## 2015-10-31 MED ORDER — CHLORHEXIDINE GLUCONATE 4 % EX LIQD
1.0000 | Freq: Once | CUTANEOUS | Status: DC
Start: 2015-10-31 — End: 2015-10-31

## 2015-10-31 MED ORDER — OXYCODONE HCL 5 MG PO TABS: 5.0000 mg | ORAL_TABLET | ORAL | Status: AC | PRN

## 2015-10-31 MED ORDER — SUCCINYLCHOLINE CHLORIDE 20 MG/ML IJ SOLN
INTRAMUSCULAR | Status: DC | PRN
  Administered 2015-10-31: 120 mg via INTRAVENOUS

## 2015-10-31 MED ORDER — IPRATROPIUM-ALBUTEROL 0.5-2.5 (3) MG/3ML IN SOLN
3.0000 mL | Freq: Once | RESPIRATORY_TRACT | Status: AC
  Administered 2015-10-31: 3 mL via RESPIRATORY_TRACT

## 2015-10-31 MED ORDER — ONDANSETRON HCL 4 MG/2ML IJ SOLN
INTRAMUSCULAR | Status: DC | PRN
Start: 2015-10-31 — End: 2015-10-31
  Administered 2015-10-31: 4 mg via INTRAVENOUS

## 2015-10-31 MED ORDER — NEOMYCIN-POLYMYXIN B GU 40-200000 IR SOLN
Status: DC | PRN
  Administered 2015-10-31: 2 mL

## 2015-10-31 MED ORDER — BUPIVACAINE HCL (PF) 0.25 % IJ SOLN
INTRAMUSCULAR | Status: AC
  Filled 2015-10-31: qty 30

## 2015-10-31 MED ORDER — CEFAZOLIN SODIUM-DEXTROSE 2-3 GM-% IV SOLR
INTRAVENOUS | Status: AC
  Filled 2015-10-31: qty 50

## 2015-10-31 MED ORDER — PROPOFOL 10 MG/ML IV BOLUS
INTRAVENOUS | Status: DC | PRN
  Administered 2015-10-31: 30 mg via INTRAVENOUS
  Administered 2015-10-31: 150 mg via INTRAVENOUS

## 2015-10-31 MED ORDER — BUPIVACAINE HCL 0.25 % IJ SOLN
INTRAMUSCULAR | Status: DC | PRN
  Administered 2015-10-31: 30 mL

## 2015-10-31 MED ORDER — LIDOCAINE HCL 1 % IJ SOLN
INTRAMUSCULAR | Status: DC | PRN
  Administered 2015-10-31: 14 mL

## 2015-10-31 MED ORDER — OXYCODONE HCL 5 MG PO TABS
ORAL_TABLET | ORAL | Status: AC
  Filled 2015-10-31: qty 1

## 2015-10-31 MED ORDER — EPINEPHRINE HCL 1 MG/ML IJ SOLN
INTRAMUSCULAR | Status: DC | PRN
  Administered 2015-10-31: 8 mL

## 2015-10-31 MED ORDER — NEOMYCIN-POLYMYXIN B GU 40-200000 IR SOLN
Status: AC
  Filled 2015-10-31: qty 1

## 2015-10-31 MED ORDER — FENTANYL CITRATE (PF) 100 MCG/2ML IJ SOLN
INTRAMUSCULAR | Status: DC | PRN
  Administered 2015-10-31 (×2): 50 ug via INTRAVENOUS

## 2015-10-31 MED ORDER — CEFAZOLIN SODIUM-DEXTROSE 2-3 GM-% IV SOLR
2.0000 g | INTRAVENOUS | Status: AC
  Administered 2015-10-31: 2 g via INTRAVENOUS

## 2015-10-31 MED ORDER — BUPIVACAINE-EPINEPHRINE (PF) 0.25% -1:200000 IJ SOLN
INTRAMUSCULAR | Status: AC
  Filled 2015-10-31: qty 30

## 2015-10-31 MED ORDER — LIDOCAINE HCL (PF) 2 % IJ SOLN
INTRAMUSCULAR | Status: DC | PRN
  Administered 2015-10-31: 1 mL via INTRADERMAL

## 2015-10-31 MED ORDER — ONDANSETRON HCL 4 MG/2ML IJ SOLN
4.0000 mg | Freq: Once | INTRAMUSCULAR | Status: AC
  Administered 2015-10-31: 4 mg via INTRAVENOUS

## 2015-10-31 MED ORDER — PHENYLEPHRINE 8 MG IN D5W 100 ML (0.08MG/ML) PREMIX OPTIME
INJECTION | INTRAVENOUS | Status: DC | PRN
  Administered 2015-10-31: 10 ug/min via INTRAVENOUS
  Administered 2015-10-31: 15 ug/min via INTRAVENOUS

## 2015-10-31 MED ORDER — OXYCODONE HCL 5 MG/5ML PO SOLN: 5.0000 mg | Freq: Once | ORAL | Status: AC | PRN

## 2015-10-31 MED ORDER — LACTATED RINGERS IV SOLN
INTRAVENOUS | Status: DC
  Administered 2015-10-31 (×2): via INTRAVENOUS

## 2015-10-31 MED ORDER — CHLORHEXIDINE GLUCONATE 4 % EX LIQD: 1.0000 "application " | Freq: Once | CUTANEOUS | Status: DC

## 2015-10-31 MED ORDER — NEOSTIGMINE METHYLSULFATE 10 MG/10ML IV SOLN
INTRAVENOUS | Status: DC | PRN
  Administered 2015-10-31: 3 mg via INTRAVENOUS

## 2015-10-31 MED ORDER — OXYCODONE HCL 5 MG PO TABS
5.0000 mg | ORAL_TABLET | Freq: Once | ORAL | Status: AC | PRN
  Administered 2015-10-31: 5 mg via ORAL

## 2015-10-31 MED ORDER — FAMOTIDINE 20 MG PO TABS
ORAL_TABLET | ORAL | Status: AC
  Administered 2015-10-31: 20 mg via ORAL
  Filled 2015-10-31: qty 1

## 2015-10-31 MED ORDER — ACETAMINOPHEN 10 MG/ML IV SOLN
INTRAVENOUS | Status: DC | PRN
  Administered 2015-10-31: 1000 mg via INTRAVENOUS

## 2015-10-31 MED ORDER — CEFAZOLIN SODIUM-DEXTROSE 2-3 GM-% IV SOLR
2.0000 g | INTRAVENOUS | Status: DC
Start: 2015-10-31 — End: 2015-10-31

## 2015-10-31 SURGICAL SUPPLY — 65 items
ADAPTER IRRIG TUBE 2 SPIKE SOL (ADAPTER) ×4 IMPLANT
ANCHOR ALL-SUT Q-FIX 2.8 (Anchor) ×2 IMPLANT
BUR RADIUS 4.0X18.5 (BURR) ×2 IMPLANT
BUR RADIUS 5.5 (BURR) ×2 IMPLANT
CANNULA 5.75X7 CRYSTAL CLEAR (CANNULA) IMPLANT
CANNULA PARTIAL THREAD 2X7 (CANNULA) IMPLANT
CANNULA TWIST IN 8.25X9CM (CANNULA) ×2 IMPLANT
CONNECTOR PERFECT PASSER (CONNECTOR) ×2 IMPLANT
COOLER POLAR GLACIER W/PUMP (MISCELLANEOUS) ×2 IMPLANT
CRADLE LAMINECT ARM (MISCELLANEOUS) ×2 IMPLANT
DRAPE IMP U-DRAPE 54X76 (DRAPES) ×4 IMPLANT
DRAPE INCISE IOBAN 66X45 STRL (DRAPES) ×2 IMPLANT
DRAPE U-SHAPE 47X51 STRL (DRAPES) ×2 IMPLANT
DURAPREP 26ML APPLICATOR (WOUND CARE) ×6 IMPLANT
ELECT REM PT RETURN 9FT ADLT (ELECTROSURGICAL) ×2
ELECTRODE REM PT RTRN 9FT ADLT (ELECTROSURGICAL) ×1 IMPLANT
GAUZE PETRO XEROFOAM 1X8 (MISCELLANEOUS) ×2 IMPLANT
GAUZE SPONGE 4X4 12PLY STRL (GAUZE/BANDAGES/DRESSINGS) ×4 IMPLANT
GLOVE BIOGEL PI IND STRL 9 (GLOVE) ×1 IMPLANT
GLOVE BIOGEL PI INDICATOR 9 (GLOVE) ×1
GLOVE SURG 9.0 ORTHO LTXF (GLOVE) ×4 IMPLANT
GOWN STRL REUS TWL 2XL XL LVL4 (GOWN DISPOSABLE) ×2 IMPLANT
GOWN STRL REUS W/ TWL LRG LVL3 (GOWN DISPOSABLE) ×1 IMPLANT
GOWN STRL REUS W/ TWL LRG LVL4 (GOWN DISPOSABLE) ×1 IMPLANT
GOWN STRL REUS W/TWL LRG LVL3 (GOWN DISPOSABLE) ×1
GOWN STRL REUS W/TWL LRG LVL4 (GOWN DISPOSABLE) ×1
IV LACTATED RINGER IRRG 3000ML (IV SOLUTION) ×8
IV LR IRRIG 3000ML ARTHROMATIC (IV SOLUTION) ×8 IMPLANT
KIT RM TURNOVER STRD PROC AR (KITS) ×2 IMPLANT
KIT STABILIZATION SHOULDER (MISCELLANEOUS) ×2 IMPLANT
KIT SUTURE 2.8 Q-FIX DISP (MISCELLANEOUS) ×2 IMPLANT
MANIFOLD NEPTUNE II (INSTRUMENTS) ×2 IMPLANT
MASK FACE SPIDER DISP (MASK) ×2 IMPLANT
MAT BLUE FLOOR 46X72 FLO (MISCELLANEOUS) ×4 IMPLANT
NDL SAFETY 18GX1.5 (NEEDLE) ×2 IMPLANT
NDL SAFETY 22GX1.5 (NEEDLE) ×2 IMPLANT
NS IRRIG 500ML POUR BTL (IV SOLUTION) ×2 IMPLANT
PACK ARTHROSCOPY SHOULDER (MISCELLANEOUS) ×2 IMPLANT
PAD WRAPON POLAR SHDR UNIV (MISCELLANEOUS) IMPLANT
PAD WRAPON POLAR SHDR XLG (MISCELLANEOUS) ×1 IMPLANT
PASSER SUT CAPTURE FIRST (SUTURE) ×2 IMPLANT
SET TUBE SUCT SHAVER OUTFL 24K (TUBING) ×2 IMPLANT
SET TUBE TIP INTRA-ARTICULAR (MISCELLANEOUS) ×2 IMPLANT
STRAP SAFETY BODY (MISCELLANEOUS) ×2 IMPLANT
STRIP CLOSURE SKIN 1/2X4 (GAUZE/BANDAGES/DRESSINGS) ×2 IMPLANT
SUT ETHILON 4-0 (SUTURE) ×1
SUT ETHILON 4-0 FS2 18XMFL BLK (SUTURE) ×1
SUT KNTLS 2.8 MAGNUM (Anchor) ×8 IMPLANT
SUT MNCRL 4-0 (SUTURE) ×1
SUT MNCRL 4-0 27XMFL (SUTURE) ×1
SUT PDS AB 0 CT1 27 (SUTURE) ×4 IMPLANT
SUT PERFECTPASSER WHITE CART (SUTURE) ×6 IMPLANT
SUT SMART STITCH CARTRIDGE (SUTURE) ×6 IMPLANT
SUT VIC AB 0 CT1 36 (SUTURE) ×4 IMPLANT
SUT VIC AB 2-0 CT2 27 (SUTURE) ×2 IMPLANT
SUTURE ETHLN 4-0 FS2 18XMF BLK (SUTURE) ×1 IMPLANT
SUTURE MAGNUM WIRE 2X48 BLK (SUTURE) IMPLANT
SUTURE MNCRL 4-0 27XMF (SUTURE) ×1 IMPLANT
SYRINGE 10CC LL (SYRINGE) ×2 IMPLANT
TAPE MICROFOAM 4IN (TAPE) ×2 IMPLANT
TUBING ARTHRO INFLOW-ONLY STRL (TUBING) ×2 IMPLANT
TUBING CONNECTING 10 (TUBING) ×2 IMPLANT
WAND HAND CNTRL MULTIVAC 90 (MISCELLANEOUS) ×2 IMPLANT
WRAPON POLAR PAD SHDR UNIV (MISCELLANEOUS)
WRAPON POLAR PAD SHDR XLG (MISCELLANEOUS) ×2

## 2015-10-31 NOTE — Anesthesia Procedure Notes (Addendum)
Anesthesia Regional Block:  Interscalene brachial plexus block  Pre-Anesthetic Checklist: ,, timeout performed, Correct Patient, Correct Site, Correct Laterality, Correct Procedure, Correct Position, site marked, Risks and benefits discussed,  Surgical consent,  Pre-op evaluation,  At surgeon's request and post-op pain management  Laterality: Upper and Left  Prep: chloraprep       Needles:  Injection technique: Single-shot  Needle Type: Stimiplex     Needle Length: 5cm 5 cm Needle Gauge: 22 and 22 G    Additional Needles:  Procedures: ultrasound guided (picture in chart) Interscalene brachial plexus block Narrative:  Start time: 10/31/2015 7:55 AM End time: 10/31/2015 7:58 AM Injection made incrementally with aspirations every 5 mL.  Performed by: Personally  Anesthesiologist: Margorie JohnPISCITELLO, JOSEPH K  Additional Notes: Functioning IV was confirmed and monitors were applied.  A 50mm 22ga Stimuplex needle was used. Sterile prep,hand hygiene and sterile gloves were used.  Negative aspiration and negative test dose prior to incremental administration of local anesthetic. The patient tolerated the procedure well with no immediate complications.   Procedure Name: Intubation Date/Time: 10/31/2015 8:05 AM Performed by: Charna BusmanIAMOND, THOMAS Pre-anesthesia Checklist: Patient identified, Emergency Drugs available, Suction available, Patient being monitored and Timeout performed Patient Re-evaluated:Patient Re-evaluated prior to inductionOxygen Delivery Method: Circle system utilized Preoxygenation: Pre-oxygenation with 100% oxygen Intubation Type: Combination inhalational/ intravenous induction and IV induction Ventilation: Mask ventilation without difficulty Laryngoscope Size: McGraph and 3 Grade View: Grade III Tube type: Oral Tube size: 7.0 mm Number of attempts: 2 Airway Equipment and Method: Stylet Placement Confirmation: ETT inserted through vocal cords under direct vision,  positive  ETCO2,  CO2 detector and breath sounds checked- equal and bilateral Secured at: 21 cm Tube secured with: Tape Dental Injury: Teeth and Oropharynx as per pre-operative assessment  Difficulty Due To: Difficulty was anticipated, Difficult Airway- due to reduced neck mobility and Difficult Airway- due to anterior larynx Future Recommendations: Recommend- induction with short-acting agent, and alternative techniques readily available

## 2015-10-31 NOTE — OR Nursing (Signed)
Dr Martha ClanKrasinski in to see patient clarified and reviewed consent with MD.

## 2015-10-31 NOTE — Anesthesia Postprocedure Evaluation (Signed)
Anesthesia Post Note  Patient: Claudia [name]  Procedure(s) Performed: Procedure(s) (LRB): SHOULDER ARTHROSCOPY, SUBACROMIAL DECOMPRESSION, BICEPS TENOTOMY AND MINI OPEN ROTATOR CUFF REPAIR (Left)  Patient location during evaluation: PACU Anesthesia Type: General Level of consciousness: awake and alert Pain management: pain level controlled Vital Signs Assessment: post-procedure vital signs reviewed and stable Respiratory status: spontaneous breathing, nonlabored ventilation, respiratory function stable and patient connected to nasal cannula oxygen Cardiovascular status: blood pressure returned to baseline and stable Postop Assessment: no signs of nausea or vomiting Anesthetic complications: no    Last Vitals:  Filed Vitals:   10/31/15 1445 10/31/15 1511  BP: 114/65 122/61  Pulse: 65 66  Temp:    Resp: 20 20    Last Pain:  Filed Vitals:   10/31/15 1513  PainSc: 6                  Jomarie LongsJoseph K Piscitello

## 2015-10-31 NOTE — Op Note (Signed)
10/31/2015  11:47 AM  PATIENT:  Claudia [name]  50 y.o. female  PRE-OPERATIVE DIAGNOSIS:  Subscapularis and possible supraspinatus tears of left shoulder and biceps tendon dislocation  POST-OPERATIVE DIAGNOSIS:  Full thickness tears of the subscapular and supraspinatus with biceps tendon subluxation  PROCEDURE:  Procedure(s): LEFT SHOULDER ARTHROSCOPY, SUBACROMIAL DECOMPRESSION, BICEPS TENOTOMY AND MINI OPEN ROTATOR CUFF REPAIR OF SUBSCAPULARIS AND SUPRASPINATUS THROUGH SEPARATE INCISIONS  SURGEON:  Surgeon(s) and Role:    * Thornton Park, MD - Primary  ANESTHESIA:   local, general and paracervical block   PREOPERATIVE INDICATIONS:  Claudia [name] is a  50 y.o. female with a diagnosis of full thickness tears of the subscapular and supraspinatus with biceps tendon subluxation, not trauma who failed conservative measures and elected for surgical management.    The risks benefits and alternatives were discussed with the patient preoperatively including but not limited to the risks of infection, bleeding, nerve injury, persistent pain or weakness, failure of the hardware, re-tear of the rotator cuff and the need for further surgery. Medical risks include DVT and pulmonary embolism, myocardial infarction, stroke, pneumonia, respiratory failure and death. Patient understood these risks and wished to proceed.  OPERATIVE IMPLANTS: ArthroCare Magnum 2 anchors x 4 & [employer] Q Fix anchors x 1  OPERATIVE PROCEDURE: The patient was met in the preoperative area. The left shoulder was signed with the word yes and my initials according the hospital's correct site of surgery protocol. The patient is brought to the OR and underwent a interscalene block and general endotracheal intubation by the anesthesia service.  The patient was placed in a beachchair position. A spider arm positioner was used for this case. Examination under anesthesia revealed no significant limitation to passive motion.  There was no instability to load shift testing..  The patient was prepped and draped in a sterile fashion. A timeout was performed to verify the patient's name, date of birth, medical record number, correct site of surgery and correct procedure to be performed there was also used to verify the patient received antibiotics that all appropriate instruments, implants and radiographs studies were available in the room. Once all in attendance were in agreement case began.  Bony landmarks were drawn out with a surgical marker along with proposed arthroscopy incisions. These were pre-injected with 1% lidocaine plain. An 11 blade was used to establish a posterior portal through which the arthroscope was placed in the glenohumeral joint. A full diagnostic examination of the shoulder was performed. An anterior lateral portal was established under direct visualization with an 18-gauge spinal needle.  A 5.75 mm arthroscopic cannula was placed through the anterolateral portal.   The intra-articular portion of the biceps tendon was found to have a high-grade partial tear involving greater than 50% of the diameter and was subluxated medially out of the intertuberous groove.. Therefore the decision was made to perform a tenotomy. An arthroscopic scissor was used to release the biceps tendon off the superior labrum. The arthroscopic shaver was then used to debride the biceps stump. There were no anterior or superior labral tears seen.    The superior half of the subscapularis tendon was torn but not significantly retracted from the lesser tuberosity. Subscapularis was mobilized with an arthroscopic elevator and 4 resector shaver blade. A 5.75 mm arthroscopic cannula was then replaced with an 8 mm 1. Through this cannula and ArthroCare perfect Pass suture was placed in the superior subscapularis.   The patient had fraying of the supraspinatus from the  glenohumeral joint. The 0 PDS suture was placed through the frayed  aspect of the supraspinatus for identification from the bursal side. There were no loose bodies within the inferior recess and no evidence of HAGL lesion.  The arthroscope was then placed in the subacromial space. A lateral portal was then established using an 18-gauge spinal needle for localization.  The 0 PDS suture was identified. This demonstrated a near full-thickness tear of the supraspinatus. The 4.77m resector shaver blade was then used to complete the tear. The greater tuberosity was debrided using a 4.0 mm resector shaver blade to remove all remaining fibers of the rotator cuff.  Debridement was performed until punctate bleeding was seen at the greater tuberosity footprint, which will allow for rotator cuff healing.  . A second perfect Pass suture was placed in the lateral rotator cuff.   The bursitis encountered in the subacromial space debrided using a 4-0 resector shaver blade and a 90 ArthroCare wand from the lateral portal. A subacromial decompression was also performed using a 5.5 mm resector shaver blade from the lateral portal All arthroscopic instruments were then removed after lavaging the subacromial space of all bone debris and the mini-open portion of the procedure began.   Internal portal was elongated. The suture placed in the subscapularis tendon was then identified and brought through the anterior deltoid split. A second perfect Pass suture was placed inferior to the first suture. The lesser tuberosity was identified and debrided of overlying torn fibers of the subscapularis using a 4.0 resector and arthroscopic rasp.  2 Magnum 2 anchors were then used to repair the rotator cuff. The perfect Pass sutures were loaded into the Magnum 2 anchors prior to deployment. These were then tensioned to allow for anatomic reduction to the lesser tuberosity.  A saber-type incision was then made along the lateral border of the acromion. The deltoid muscle was identified and split in line with its  fibers which allowed visualization of the rotator cuff. The Perfect Pass stitches previously placed in the lateral border of the rotator cuff werealso brought out through the deltoid split. A single Q-Fix anchor was then placed at the articular margin of the humeral head and greater tuberosity. The four suture limbs of both Q Fix anchors were passed medially through the rotator cuff using a first pass suture passer. The Perfect Pass stitches from the lateral border of the rotator cuff were then anchored to thegreater tuberosity of the humeral head using two Magnum 2 anchors. These anchors were tensioned to allow for anatomic reduction of the rotator cuff to the greater tuberosity footprint. The medial row repair was then completed using an arthroscopic knot tying technique with the Q fix anchor sutures. Once all sutures were tied down, arthroscopic images of the double row repair were taken with the arthroscope both externally and arthroscopically fromthe glenohumeral joint.  All incisions were copiously irrigated. The deltoid fascia was repaired using a 0 Vicryl suture in an interrupted fashion. The subcutaneous tissue of all incisions were closed with a 2-0 Vicryl. Skin closure for the arthroscopic incisions was performed with 4-0 nylon. The skin edges of the saber incision were approximated with a running 4-0 undyed Monocryl. A dry sterile dressing was applied. The patient was placed in an abduction sling, with a Polar Care sleeve, and TENS unit leads were applied.  .  All sharp and instrument counts were correct at the conclusion of the case. I was scrubbed and present for the entire case. I spoke with  the patient's family, including her daughter who is a Marine scientist, in the postoperative consultation room to let them know the case had been performed without complication and the patient was stable in recovery room.      Timoteo Gaul, MD

## 2015-10-31 NOTE — Progress Notes (Signed)
  Subjective:  Post- op  Check:  Patient reports pain as moderate.  No nausea and vomiting.  Objective:   VITALS:   Filed Vitals:   10/31/15 1324 10/31/15 1339 10/31/15 1354 10/31/15 1400  BP: 100/62 108/84 112/86   Pulse: 73 63 68 73  Temp:   97.2 F (36.2 C)   TempSrc:      Resp: 24 22 18 22   Height:      Weight:      SpO2: 95% 94% 94% 95%    PHYSICAL EXAM:  Left upper extremity:  Dressing CDI.  Patient has intact sensation to light touch in all 5 fingers and can flex and extend all digits.  Sling in place.    LABS  Results for orders placed or performed during the hospital encounter of 10/31/15 (from the past 24 hour(s))  Pregnancy, urine POC     Status: None   Collection Time: 10/31/15  6:11 AM  Result Value Ref Range   Preg Test, Ur NEGATIVE NEGATIVE  Basic metabolic panel     Status: Abnormal   Collection Time: 10/31/15  6:36 AM  Result Value Ref Range   Sodium 137 135 - 145 mmol/L   Potassium 3.7 3.5 - 5.1 mmol/L   Chloride 105 101 - 111 mmol/L   CO2 24 22 - 32 mmol/L   Glucose, Bld 106 (H) 65 - 99 mg/dL   BUN 6 6 - 20 mg/dL   Creatinine, Ser 8.460.49 0.44 - 1.00 mg/dL   Calcium 9.0 8.9 - 96.210.3 mg/dL   GFR calc non Af Amer >60 >60 mL/min   GFR calc Af Amer >60 >60 mL/min   Anion gap 8 5 - 15  I-STAT 4, (NA,K, GLUC, HGB,HCT)     Status: Abnormal   Collection Time: 10/31/15  6:52 AM  Result Value Ref Range   Sodium 137 135 - 145 mmol/L   Potassium 3.7 3.5 - 5.1 mmol/L   Glucose, Bld 104 (H) 65 - 99 mg/dL   HCT 95.238.0 84.136.0 - 32.446.0 %   Hemoglobin 12.9 12.0 - 15.0 g/dL    No results found.  Assessment/Plan: Day of Surgery   Patient stable post-op.  She will be discharged home today and follow up with me in 7-10 days.      Juanell FairlyKRASINSKI, Keniya Schlotterbeck , MD 10/31/2015, 2:15 PM

## 2015-10-31 NOTE — Anesthesia Preprocedure Evaluation (Signed)
Anesthesia Evaluation  Patient identified by MRN, date of birth, ID band Patient awake    Reviewed: Allergy & Precautions, H&P , NPO status , Patient's Chart, lab work & pertinent test results  History of Anesthesia Complications Negative for: history of anesthetic complications  Airway Mallampati: III  TM Distance: >3 FB Neck ROM: limited    Dental  (+) Poor Dentition, Chipped   Pulmonary neg shortness of breath, asthma ,    Pulmonary exam normal breath sounds clear to auscultation       Cardiovascular Exercise Tolerance: Good (-) angina(-) Past MI and (-) DOE Normal cardiovascular exam+ dysrhythmias  Rhythm:regular Rate:Normal     Neuro/Psych negative neurological ROS  negative psych ROS   GI/Hepatic negative GI ROS, Neg liver ROS,   Endo/Other  Morbid obesity  Renal/GU negative Renal ROS  negative genitourinary   Musculoskeletal  (+) Arthritis ,   Abdominal   Peds  Hematology negative hematology ROS (+)   Anesthesia Other Findings Past Medical History:   Asthma                                                         Comment:H/O YEARS AGO-NO INHALERS   Arthritis                                                    [facility]                                                    [facility]   Rotator cuff tear                                              Comment:left  Past Surgical History:   CARPAL TUNNEL RELEASE                                           Comment:x2   TONSILLECTOMY                                                 COLONOSCOPY                                                   CHOLECYSTECTOMY                                              BMI  Body Mass Index   41.63 kg/m 2      Reproductive/Obstetrics negative OB ROS                             Anesthesia Physical Anesthesia Plan  ASA: III  Anesthesia Plan: General ETT    Post-op Pain Management: MAC Combined w/ Regional for Post-op pain   Induction:   Airway Management Planned:   Additional Equipment:   Intra-op Plan:   Post-operative Plan:   Informed Consent: I have reviewed the patients History and Physical, chart, labs and discussed the procedure including the risks, benefits and alternatives for the proposed anesthesia with the patient or authorized representative who has indicated his/her understanding and acceptance.   Dental Advisory Given  Plan Discussed with: Anesthesiologist, CRNA and Surgeon  Anesthesia Plan Comments:         Anesthesia Quick Evaluation

## 2015-10-31 NOTE — H&P (Signed)
PREOPERATIVE H&P  Chief Complaint: Left shoulder  HPI: Claudia [name] is a 50 y.o. female who presents for preoperative history and physical with a diagnosis of left shoulder subscapularis tear, biceps dislocation and possible supraspinatus tear confirmed by MRI with persistent pain and weakness in the left shoulder affecting her ability to perform ADLs and at her job. Symptoms are rated as moderate to severe, and have been worsening  She has elected for surgical management.   Past Medical History  Diagnosis Date  . Asthma     H/O YEARS AGO-NO INHALERS  . Arthritis   . Dysrhythmia     H/O [facility] PROBLEMS SINCE 2013  . Rotator cuff tear     left   Past Surgical History  Procedure Laterality Date  . Carpal tunnel release      x2  . Tonsillectomy    . Colonoscopy    . Cholecystectomy     Social History   Social History  . Marital Status: Legally Separated    Spouse Name: N/A  . Number of Children: N/A  . Years of Education: N/A   Social History Main Topics  . Smoking status: Never Smoker   . Smokeless tobacco: None  . Alcohol Use: No  . Drug Use: No  . Sexual Activity: Not Asked   Other Topics Concern  . None   Social History Narrative   Family History  Problem Relation Age of Onset  . Asthma Paternal Grandmother   . Heart attack Paternal Grandmother    No Known Allergies Prior to Admission medications   Medication Sig Start Date End Date Taking? Authorizing Provider  acetaminophen (TYLENOL) 500 MG tablet Take 500 mg by mouth every 6 (six) hours as needed.   Yes Historical Provider, MD  potassium chloride (K-DUR) 10 MEQ tablet Take 10 mEq by mouth 2 (two) times daily.   Yes Historical Provider, MD     Positive ROS: All other systems have been reviewed and were otherwise negative with the exception of those mentioned in the HPI and as above.  Physical Exam: General: Alert, no acute distress Cardiovascular: Regular rate and rhythm, no murmurs rubs or  gallops.  No pedal edema Respiratory: Clear to auscultation bilaterally, no wheezes rales or rhonchi. No cyanosis, no use of accessory musculature GI: No organomegaly, abdomen is soft and non-tender nondistended with positive bowel sounds. Skin: Skin intact, no lesions within the operative field. Neurologic: Sensation intact distally Psychiatric: Patient is competent for consent with normal mood and affect Lymphatic: No axillary or cervical lymphadenopathy  MUSCULOSKELETAL: LEFT SHOULDER:  Skin intact.  No erythema, ecchymosis or swelling.  Patient has pain and weakness with resisted internal rotation.  She also has pain with abduction.  She is neurovascularly intact in the left upper extremity with a palpable radial pulse.  She demonstrates no instability.  Assessment: Left shoulder subscapularis and possible supraspinatus tear  Plan: Plan for Procedure(s): LEFT SHOULDER ARTHROSCOPY WITH MINI OPEN ROTATOR CUFF REPAIR and POSSIBLE BICEPS TENOTOMY VS TENODESIS  I explained to the patient the details of the operation and the post-operative course.  I discussed the risks and benefits of surgery. She understands the risks include but are not limited to infection, bleeding requiring blood transfusion, nerve or blood vessel injury, joint stiffness or loss of motion, persistent pain, weakness or instability, and hardware failure and the need for further surgery. Medical risks include but are not limited to DVT and pulmonary embolism, myocardial infarction, stroke, pneumonia, respiratory failure and death. Patient  understood these risks and wished to proceed. I answered all her questions.  Juanell FairlyKRASINSKI, Arman Loy, MD   10/31/2015 7:32 AM

## 2015-10-31 NOTE — OR Nursing (Signed)
Sacral pad applied 

## 2015-10-31 NOTE — Transfer of Care (Signed)
Immediate Anesthesia Transfer of Care Note  Patient: Claudia [name]  Procedure(s) Performed: Procedure(s): SHOULDER ARTHROSCOPY, SUBACROMIAL DECOMPRESSION, BICEPS TENOTOMY AND MINI OPEN ROTATOR CUFF REPAIR (Left)  Patient Location:    Anesthesia Type:General  Level of Consciousness: awake, oriented and patient cooperative  Airway & Oxygen Therapy: Patient Spontanous Breathing and Patient connected to face mask oxygen  Post-op Assessment: Report given to RN and Post -op Vital signs reviewed and stable  Post vital signs: Reviewed and stable  Last Vitals:  Filed Vitals:   10/31/15 0612  BP: 136/86  Pulse: 83  Temp: 36.6 C  Resp: 16    Complications: No apparent anesthesia complications

## 2015-11-09 ENCOUNTER — Emergency Department
Admission: EM | Admit: 2015-11-09 | Discharge: 2015-11-09 | Disposition: A | Discharge status: Home / Self Care | Payer: 59 | Attending: Emergency Medicine | Admitting: Emergency Medicine

## 2015-11-09 ENCOUNTER — Emergency Department: Payer: 59

## 2015-11-09 DIAGNOSIS — N390 Urinary tract infection, site not specified: Secondary | ICD-10-CM | POA: Insufficient documentation

## 2015-11-09 DIAGNOSIS — Z3202 Encounter for pregnancy test, result negative: Secondary | ICD-10-CM | POA: Insufficient documentation

## 2015-11-09 DIAGNOSIS — K5901 Slow transit constipation: Secondary | ICD-10-CM | POA: Diagnosis not present

## 2015-11-09 DIAGNOSIS — R1084 Generalized abdominal pain: Secondary | ICD-10-CM | POA: Diagnosis not present

## 2015-11-09 DIAGNOSIS — K5909 Other constipation: Secondary | ICD-10-CM | POA: Diagnosis not present

## 2015-11-09 DIAGNOSIS — K5900 Constipation, unspecified: Secondary | ICD-10-CM | POA: Diagnosis not present

## 2015-11-09 LAB — URINALYSIS COMPLETE WITH MICROSCOPIC (ARMC ONLY)
BILIRUBIN URINE: NEGATIVE
Glucose, UA: NEGATIVE mg/dL
KETONES UR: NEGATIVE mg/dL
NITRITE: POSITIVE — AB
PH: 6 (ref 5.0–8.0)
PROTEIN: NEGATIVE mg/dL
SPECIFIC GRAVITY, URINE: 1.01 (ref 1.005–1.030)

## 2015-11-09 LAB — POCT PREGNANCY, URINE: Preg Test, Ur: NEGATIVE

## 2015-11-09 MED ORDER — MAGNESIUM CITRATE PO SOLN
1.0000 | Freq: Once | ORAL | Status: AC
  Administered 2015-11-09: 1 via ORAL
  Filled 2015-11-09: qty 296

## 2015-11-09 MED ORDER — MINERAL OIL RE ENEM
1.0000 | ENEMA | Freq: Once | RECTAL | Status: AC
  Administered 2015-11-09: 1 via RECTAL

## 2015-11-09 MED ORDER — CIPROFLOXACIN HCL 500 MG PO TABS: 500.0000 mg | ORAL_TABLET | Freq: Two times a day (BID) | ORAL | Status: AC

## 2015-11-09 MED ORDER — CIPROFLOXACIN HCL 500 MG PO TABS
500.0000 mg | ORAL_TABLET | Freq: Once | ORAL | Status: AC
  Administered 2015-11-09: 500 mg via ORAL
  Filled 2015-11-09: qty 1

## 2015-11-09 MED ORDER — DOCUSATE SODIUM 50 MG/5ML PO LIQD
100.0000 mg | Freq: Once | ORAL | Status: AC
  Administered 2015-11-09: 100 mg via ORAL
  Filled 2015-11-09: qty 10

## 2015-11-09 NOTE — ED Notes (Signed)
Pt given remainder of enema. Pt sitting on toilet at this time. Pt instructed to let RN know once needs assistance. Pt verbalized understanding. Family at bedside per request of family. Pt stable, NAD noted.

## 2015-11-09 NOTE — ED Notes (Signed)
Pt given enema. RN unable to advance enema tubing far, pt impacted. MD Manson PasseyBrown made aware, at bedside preforming rectal exam at this time. Disimpacting pt. Pt tolerating well.

## 2015-11-09 NOTE — ED Notes (Signed)
Pt successful with BM at this time, pt states "I feel a lot better, about 85% of the way there" MD Manson PasseyBrown made aware, no further orders given at this time.

## 2015-11-09 NOTE — Discharge Instructions (Signed)
Constipation, Adult °Constipation is when a person has fewer than three bowel movements a week, has difficulty having a bowel movement, or has stools that are dry, hard, or larger than normal. As people grow older, constipation is more common. A low-fiber diet, not taking in enough fluids, and taking certain medicines may make constipation worse.  °CAUSES  °· Certain medicines, such as antidepressants, pain medicine, iron supplements, antacids, and water pills.   °· Certain diseases, such as diabetes, irritable bowel syndrome (IBS), thyroid disease, or depression.   °· Not drinking enough water.   °· Not eating enough fiber-rich foods.   °· Stress or travel.   °· Lack of physical activity or exercise.   °· Ignoring the urge to have a bowel movement.   °· Using laxatives too much.   °SIGNS AND SYMPTOMS  °· Having fewer than three bowel movements a week.   °· Straining to have a bowel movement.   °· Having stools that are hard, dry, or larger than normal.   °· Feeling full or bloated.   °· Pain in the lower abdomen.   °· Not feeling relief after having a bowel movement.   °DIAGNOSIS  °Your health care provider will take a medical history and perform a physical exam. Further testing may be done for severe constipation. Some tests may include: °· A barium enema X-ray to examine your rectum, colon, and, sometimes, your small intestine.   °· A sigmoidoscopy to examine your lower colon.   °· A colonoscopy to examine your entire colon. °TREATMENT  °Treatment will depend on the severity of your constipation and what is causing it. Some dietary treatments include drinking more fluids and eating more fiber-rich foods. Lifestyle treatments may include regular exercise. If these diet and lifestyle recommendations do not help, your health care provider may recommend taking over-the-counter laxative medicines to help you have bowel movements. Prescription medicines may be prescribed if over-the-counter medicines do not work.    °HOME CARE INSTRUCTIONS  °· Eat foods that have a lot of fiber, such as fruits, vegetables, whole grains, and beans. °· Limit foods high in fat and processed sugars, such as french fries, hamburgers, cookies, candies, and soda.   °· A fiber supplement may be added to your diet if you cannot get enough fiber from foods.   °· Drink enough fluids to keep your urine clear or pale yellow.   °· Exercise regularly or as directed by your health care provider.   °· Go to the restroom when you have the urge to go. Do not hold it.   °· Only take over-the-counter or prescription medicines as directed by your health care provider. Do not take other medicines for constipation without talking to your health care provider first.   °SEEK IMMEDIATE MEDICAL CARE IF:  °· You have bright red blood in your stool.   °· Your constipation lasts for more than 4 days or gets worse.   °· You have abdominal or rectal pain.   °· You have thin, pencil-like stools.   °· You have unexplained weight loss. °MAKE SURE YOU:  °· Understand these instructions. °· Will watch your condition. °· Will get help right away if you are not doing well or get worse. °  °This information is not intended to replace advice given to you by your health care provider. Make sure you discuss any questions you have with your health care provider. °  °Document Released: 05/10/2004 Document Revised: 09/02/2014 Document Reviewed: 05/24/2013 °Elsevier Interactive Patient Education ©2016 [employer] °Urinary Tract Infection °Urinary tract infections (UTIs) can develop anywhere along your urinary tract. Your urinary tract is your body's   drainage system for removing wastes and extra water. Your urinary tract includes two kidneys, two ureters, a bladder, and a urethra. Your kidneys are a pair of bean-shaped organs. Each kidney is about the size of your fist. They are located below your ribs, one on each side of your spine. °CAUSES °Infections are caused by microbes, which are  microscopic organisms, including fungi, viruses, and bacteria. These organisms are so small that they can only be seen through a microscope. Bacteria are the microbes that most commonly cause UTIs. °SYMPTOMS  °Symptoms of UTIs may vary by age and gender of the patient and by the location of the infection. Symptoms in young women typically include a frequent and intense urge to urinate and a painful, burning feeling in the bladder or urethra during urination. Older women and men are more likely to be tired, shaky, and weak and have muscle aches and abdominal pain. A fever may mean the infection is in your kidneys. Other symptoms of a kidney infection include pain in your back or sides below the ribs, nausea, and vomiting. °DIAGNOSIS °To diagnose a UTI, your caregiver will ask you about your symptoms. Your caregiver will also ask you to provide a urine sample. The urine sample will be tested for bacteria and white blood cells. White blood cells are made by your body to help fight infection. °TREATMENT  °Typically, UTIs can be treated with medication. Because most UTIs are caused by a bacterial infection, they usually can be treated with the use of antibiotics. The choice of antibiotic and length of treatment depend on your symptoms and the type of bacteria causing your infection. °HOME CARE INSTRUCTIONS °· If you were prescribed antibiotics, take them exactly as your caregiver instructs you. Finish the medication even if you feel better after you have only taken some of the medication. °· Drink enough water and fluids to keep your urine clear or pale yellow. °· Avoid caffeine, tea, and carbonated beverages. They tend to irritate your bladder. °· Empty your bladder often. Avoid holding urine for long periods of time. °· Empty your bladder before and after sexual intercourse. °· After a bowel movement, women should cleanse from front to back. Use each tissue only once. °SEEK MEDICAL CARE IF:  °· You have back  pain. °· You develop a fever. °· Your symptoms do not begin to resolve within 3 days. °SEEK IMMEDIATE MEDICAL CARE IF:  °· You have severe back pain or lower abdominal pain. °· You develop chills. °· You have nausea or vomiting. °· You have continued burning or discomfort with urination. °MAKE SURE YOU:  °· Understand these instructions. °· Will watch your condition. °· Will get help right away if you are not doing well or get worse. °  °This information is not intended to replace advice given to you by your health care provider. Make sure you discuss any questions you have with your health care provider. °  °Document Released: 05/22/2005 Document Revised: 05/03/2015 Document Reviewed: 09/20/2011 °Elsevier Interactive Patient Education ©2016 [employer] ° °

## 2015-11-09 NOTE — ED Notes (Signed)
Pt arrived to ED with c/o diffuse abdominal pain since Saturday. Pt reports Last BM on March 6th. Pt had rotator cuff surgery on March 7th and has been taking Oxycodone for pain. Pt reports taking colace and Dulcolax for 9 days w/o relief.

## 2015-11-09 NOTE — ED Provider Notes (Signed)
[facility] Emergency Department Provider Note  ____________________________________________  Time seen: 5:00 AM  I have reviewed the triage vital signs and the nursing notes.   HISTORY  Chief Complaint Abdominal Pain      HPI Claudia [name] is a 50 y.o. female with history of left rotator cuff surgery performed on March 7 presents with generalized abdominal discomfort and constipation. Patient states last bowel movement was on March 6. Patient currently taking oxycodone for pain at home. Patient also admits to urinary frequency and dysuria. Patient denies any fever or afebrile on presentation with a temperature of 98.2.     Past Medical History  Diagnosis Date  . Asthma     H/O YEARS AGO-NO INHALERS  . Arthritis   . Dysrhythmia     [facility]  . Rotator cuff tear     left  . Difficult intubation     Patient Active Problem List   Diagnosis Date Noted  . Pulmonary hypertension (HCC) 08/16/2012  . POTS (postural orthostatic tachycardia syndrome) 08/16/2012  . Chest pain 06/26/2012  . Shortness of breath 06/26/2012  . Rapid heart beat 06/26/2012    Past Surgical History  Procedure Laterality Date  . Carpal tunnel release      x2  . Tonsillectomy    . Colonoscopy    . Cholecystectomy    . Shoulder arthroscopy with open rotator cuff repair Left 10/31/2015    Procedure: SHOULDER ARTHROSCOPY, SUBACROMIAL DECOMPRESSION, BICEPS TENOTOMY AND MINI OPEN ROTATOR CUFF REPAIR;  Surgeon: Juanell FairlyKevin Krasinski, MD;  Location: [facility] ORS;  Service: Orthopedics;  Laterality: Left;    Current Outpatient Rx  Name  Route  Sig  Dispense  Refill  . acetaminophen (TYLENOL) 500 MG tablet   Oral   Take 500 mg by mouth every 6 (six) hours as needed.         . ondansetron (ZOFRAN) 4 MG tablet   Oral   Take 1 tablet (4 mg total) by mouth every 8 (eight) hours as needed for nausea or vomiting.   30 tablet   0   . oxyCODONE (OXY  IR/ROXICODONE) 5 MG immediate release tablet   Oral   Take 1-2 tablets (5-10 mg total) by mouth every 4 (four) hours as needed for severe pain.   60 tablet   0     Allergies No known drug allergies  Family History  Problem Relation Age of Onset  . Asthma Paternal Grandmother   . Heart attack Paternal Grandmother     Social History Social History  Substance Use Topics  . Smoking status: Never Smoker   . Smokeless tobacco: Not on file  . Alcohol Use: No    Review of Systems  Constitutional: Negative for fever. Eyes: Negative for visual changes. ENT: Negative for sore throat. Cardiovascular: Negative for chest pain. Respiratory: Negative for shortness of breath. Gastrointestinal: Positive for generalized abdominal discomfort and constipation Genitourinary: Positive for dysuria and urinary frequency Musculoskeletal: Negative for back pain. Skin: Negative for rash. Neurological: Negative for headaches, focal weakness or numbness.   10-point ROS otherwise negative.  ____________________________________________   PHYSICAL EXAM:  VITAL SIGNS: ED Triage Vitals  Enc Vitals Group     BP 11/09/15 0400 147/75 mmHg     Pulse Rate 11/09/15 0400 90     Resp 11/09/15 0400 16     Temp 11/09/15 0400 98.2 F (36.8 C)     Temp Source 11/09/15 0400 Oral     SpO2  11/09/15 0400 96 %     Weight 11/09/15 0400 249 lb (112.946 kg)     Height 11/09/15 0400  (1.6 m)     Head Cir --      Peak Flow --      Pain Score 11/09/15 0401 8     Pain Loc --      Pain Edu? --      Excl. in GC? --      Constitutional: Alert and oriented. Well appearing and in no distress. Eyes: Conjunctivae are normal. PERRL. Normal extraocular movements. ENT   Head: Normocephalic and atraumatic.   Nose: No congestion/rhinnorhea.   Mouth/Throat: Mucous membranes are moist.   Neck: No stridor. Hematological/Lymphatic/Immunilogical: No cervical lymphadenopathy. Cardiovascular: Normal  rate, regular rhythm. Normal and symmetric distal pulses are present in all extremities. No murmurs, rubs, or gallops. Respiratory: Normal respiratory effort without tachypnea nor retractions. Breath sounds are clear and equal bilaterally. No wheezes/rales/rhonchi. Gastrointestinal: Left lower quadrant/suprapubic tenderness to palpation. No distention. There is no CVA tenderness. Rectal exam revealed a large heart stool burden Genitourinary: deferred Musculoskeletal: Nontender with normal range of motion in all extremities. No joint effusions.  No lower extremity tenderness nor edema. Neurologic:  Normal speech and language. No gross focal neurologic deficits are appreciated. Speech is normal.  Skin:  Skin is warm, dry and intact. No rash noted. Psychiatric: Mood and affect are normal. Speech and behavior are normal. Patient exhibits appropriate insight and judgment.  ____________________________________________    LABS (pertinent positives/negatives)  Labs Reviewed  URINALYSIS COMPLETEWITH MICROSCOPIC ([facility] ONLY) - Abnormal; Notable for the following:    Color, Urine YELLOW (*)    APPearance CLOUDY (*)    Hgb urine dipstick 1+ (*)    Nitrite POSITIVE (*)    Leukocytes, UA 3+ (*)    Bacteria, UA MANY (*)    Squamous Epithelial / LPF 0-5 (*)    All other components within normal limits  POC URINE PREG, ED  POCT PREGNANCY, URINE      RADIOLOGY     DG Abd 1 View (Final result) Result time: 11/09/15 04:51:41   Final result by Rad Results In Interface (11/09/15 04:51:41)   Narrative:   CLINICAL DATA: Constipation  EXAM: ABDOMEN - 1 VIEW  COMPARISON: None.  FINDINGS: Moderate stool volume without obstruction or impaction. Cholecystectomy clips. No concerning intra-abdominal mass effect or calcification.  IMPRESSION: Moderate stool volume without obstruction or impaction   Electronically Signed By: Marnee Spring M.D. On: 11/09/2015 04:51       ------------------------------------------------------------------------------------------------------------------- Fecal Disimpaction Procedure Note:  Performed by me:  Patient placed in the lateral recumbent position with knees drawn towards chest. Nurse present for patient support. Large amount of hard brown stool removed. No complications during procedure.   ------------------------------------------------------------------------------------------------------------------     INITIAL IMPRESSION / ASSESSMENT AND PLAN / ED COURSE  Pertinent labs & imaging results that were available during my care of the patient were reviewed by me and considered in my medical decision making (see chart for details).    ____________________________________________   FINAL CLINICAL IMPRESSION(S) / ED DIAGNOSES  Final diagnoses:  Slow transit constipation  UTI (lower urinary tract infection)      Darci Current, MD 11/09/15 [phone]

## 2015-11-17 ENCOUNTER — Ambulatory Visit: Payer: 59 | Attending: Orthopedic Surgery | Admitting: Physical Therapy

## 2015-11-17 ENCOUNTER — Encounter: Payer: Self-pay | Admitting: Physical Therapy

## 2015-11-17 DIAGNOSIS — M25612 Stiffness of left shoulder, not elsewhere classified: Secondary | ICD-10-CM | POA: Insufficient documentation

## 2015-11-17 DIAGNOSIS — M25512 Pain in left shoulder: Secondary | ICD-10-CM | POA: Diagnosis not present

## 2015-11-17 DIAGNOSIS — R29898 Other symptoms and signs involving the musculoskeletal system: Secondary | ICD-10-CM | POA: Insufficient documentation

## 2015-11-17 NOTE — Therapy (Signed)
[facility] MAIN [facility] SERVICES [address] [place], [place], [address] Phone: [phone]   Fax:  [phone]  Physical Therapy Evaluation  Patient Details  Name: Claudia [name] MRN: [id number] Date of Birth: 1966-01-07 Referring Provider: Juanell Fairly MD  Encounter Date: 11/17/2015      PT End of Session - 11/17/15 0959    Visit Number 1   Number of Visits 17   Date for PT Re-Evaluation 01/12/16   PT Start Time 0830   PT Stop Time 0925   PT Time Calculation (min) 55 min   Activity Tolerance Patient limited by pain   Behavior During Therapy Anxious      Past Medical History  Diagnosis Date  . Asthma     H/O YEARS AGO-NO INHALERS  . Dysrhythmia     H/O [facility] PROBLEMS SINCE 2013  . Rotator cuff tear     left  . Difficult intubation   . Arthritis     knees, back;     Past Surgical History  Procedure Laterality Date  . Carpal tunnel release      x2  . Tonsillectomy    . Colonoscopy    . Cholecystectomy    . Shoulder arthroscopy with open rotator cuff repair Left 10/31/2015    Procedure: SHOULDER ARTHROSCOPY, SUBACROMIAL DECOMPRESSION, BICEPS TENOTOMY AND MINI OPEN ROTATOR CUFF REPAIR;  Surgeon: Juanell Fairly, MD;  Location: [facility] ORS;  Service: Orthopedics;  Laterality: Left;    There were no vitals filed for this visit.  Visit Diagnosis:  Left shoulder pain - Plan: PT plan of care cert/re-cert  Shoulder stiffness, left - Plan: PT plan of care cert/re-cert  Weakness of left arm - Plan: PT plan of care cert/re-cert      Subjective Assessment - 11/17/15 0836    Subjective 50 yo Female reports having left shoulder pain for 1-2 years. she tried conservative PT for shoulder pain with minimal relief. MRI showed full-thickness tear. Patient is s/p left rotator cuff repair on 10/31/15; Patient is right handed; She presents with left shoulder abduction sling; She reports having some discomfort in left shoulder but not  too bad; She reports taking pain medication regularly which helps; She reports wearing sling 24/7 except when bathing; She is sleeping in sling and up with sling; She reports having multiple sleep disturbances, doing more napping than sleeping; Patient reports that she is sleeping in her bed, but is highly propped up; she doens't have a recliner at home; She does report numbness in LUE numbness along C5=C6 dermatome which some was present before surgery;    Pertinent History personal factors affecting rehab: chronicity of left shoulder pain, fear avoidance, anxiety over injury; history of arthritis;    How long can you sit comfortably? 5-66min;    How long can you stand comfortably? NA   How long can you walk comfortably? NA   Diagnostic tests had MRI which showed full thickness tear prior to surgery;    Patient Stated Goals Get back to moving LUE; relieve some of pain;    Currently in Pain? Yes   Pain Score 6    Pain Location Shoulder   Pain Orientation Left   Pain Descriptors / Indicators Burning;Aching   Pain Type Acute pain;Surgical pain   Pain Onset 1 to 4 weeks ago   Pain Frequency Constant   Aggravating Factors  trying to move a certain way; having it hanging down;    Pain Relieving Factors propping up; elevation;  sitting straight up;    Effect of Pain on Daily Activities decreased             [facility] - 11/17/15 0001    Assessment   Medical Diagnosis s/p left rotator cuff repair 10/31/15   Referring Provider Juanell FairlyKevin Krasinski MD   Onset Date/Surgical Date 10/31/15   Hand Dominance Right   Next MD Visit April 2017   Prior Therapy Had PT for left shoulder pain about a year ago without relief; Also had PT years ago for back pain with improvement; Denies any PT for this current condition;    Precautions   Precautions Shoulder   Type of Shoulder Precautions No AROM, no lifting; wear shoulder abduction sling;  s/p RCR   Required Braces or Orthoses Sling   Restrictions    Weight Bearing Restrictions No  no lifting;    Balance Screen   Has the patient fallen in the past 6 months No   Has the patient had a decrease in activity level because of a fear of falling?  Yes   Is the patient reluctant to leave their home because of a fear of falling?  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;    [employer] Requirements unsure;    Leisure going to movies, going to dinner, hanging out with family/friends;    Observation/Other Assessments   Quick DASH  100% (full disability, unable to do anything with LUE)   Sensation   Light Touch Appears Intact   Additional Comments reports some numbness/tingling along C5-C6 dermatome but intact light touch sensation and deep pressure;    Coordination   Gross Motor Movements are Fluid and Coordinated --  yes in RUE   Fine Motor Movements are Fluid and Coordinated --  yes in RUE   Posture/Postural Control   Posture Comments sits with moderate forward head and slumped posture; exhibits increased left shoulder depression in sitting;    AROM   Overall AROM Comments RUE and BLE AROM is WFL   Right Shoulder Extension 50 Degrees   Right Shoulder Flexion 165 Degrees   Right Shoulder ABduction 170 Degrees   Right Shoulder Internal Rotation 75 Degrees  supine, 90 degrees abduction   Right Shoulder External Rotation 88 Degrees  supine 90 degrees abduction   PROM   Left Shoulder Flexion 20 Degrees   Left Shoulder ABduction 50 Degrees   Left Shoulder Internal Rotation 40 Degrees  supine with shoulder 45 degrees abduction   Left Shoulder External Rotation 15 Degrees  supine with shoulder 45 degrees  abduction   Strength   Overall Strength Comments RUE and BLE gross strength is WFL, LUE not tested due to post surgical;   Right Shoulder Flexion 4/5   Right Shoulder Extension 4+/5   Right Shoulder ABduction 4+/5   Right Shoulder Internal Rotation 4/5   Right Shoulder External Rotation 4/5   Right Hand Grip (lbs) 40   Left Hand Grip (lbs) 20   Palpation   Palpation comment has moderate tenderness to palpation along left scapular paraspinals;    Transfers   Comments able to transfer independently;    Ambulation/Gait   Gait  Comments ambulates independently with good gait pattern;    High Level Balance   High Level Balance Comments demonstrates good static and dynamic standing balance;      TREATMENT:  PT educated patient in HEP: LUE elbow flexion/extension x10 reps with cues to increase elbow extension for better ROM; LUE forearm pronation/supination x5 reps; LUE wrist flexion/extension x5 reps; Educated patient in importance of resting LUE shoulder and avoiding AROM and focus on ROM in elbow, wrist and hand for increased flexibility; Also recommend patient ice shoulder as tolerated to reduce pain/discomfort;                        PT Education - 11/17/15 0958    Education provided Yes   Education Details HEP initiated;    Person(s) Educated Patient   Methods Explanation;Demonstration;Verbal cues;Tactile cues;Handout   Comprehension Verbalized understanding;Returned demonstration;Verbal cues required;Tactile cues required             PT Long Term Goals - 11/17/15 1132    PT LONG TERM GOAL #1   Title Patient will be independent in home exercise program to improve strength/mobility for better functional independence with ADLs. by 01/12/16   Time 8   Period Weeks   Status New   PT LONG TERM GOAL #2   Title Patient will decrease Quick DASH score by > 8 points demonstrating reduced self-reported upper extremity disability. by 01/12/16   Time 8   Period  Weeks   Status New   PT LONG TERM GOAL #3   Title Patient will increase LUE shoulder AROM: sitting: Shoulder flexion >120 degrees, abduction: >120 degrees, supine: ER: >50 degrees, IR: >50 degrees for increased functional ROM with ADLs such as grooming, dressing etc;    Time 8   Period Weeks   Status New   PT LONG TERM GOAL #4   Title Patient will increase LUE shoulder strength to >4/5 for increased functional strength with lifting groceries, doing household chores etc. by 01/12/16   Time 8   Period Weeks   Status New   PT LONG TERM GOAL #5   Title Patient will report a worst pain of 3/10 on VAS in    LUE         to improve tolerance with ADLs and reduced symptoms with activities.    Time 8   Period Weeks   Status New               Plan - 11/17/15 0959    Clinical Impression Statement 50 yo Female s/p left full rotator cuff tear s/p open rotator cuff repair on 10/31/15; Patient presents to therapy with LUE sling with abduction pillow. She has restrictions of no AROM in LUE, no lifting and to wear sling 24/7. Patient exhibits significant discomfort in LUE having difficulty tolerating PROM. She exhibits significant limited ROM in LUE. Strength not tested due to post surgical but likely would be weak due to pain and fear avoidance. Patient would benefit from skilled PT intervention to improve UE ROM, strength and return to PLOF.    Pt will benefit from skilled therapeutic intervention in order to improve on the following deficits Decreased endurance;Obesity;Decreased knowledge of precautions;Decreased activity tolerance;Decreased strength;Impaired UE functional use;Pain;Increased muscle spasms;Decreased mobility;Decreased range of motion;Improper body mechanics;Postural dysfunction;Impaired flexibility   Rehab Potential Fair   Clinical Impairments Affecting Rehab Potential positive: motivated, negative: co-morbidities, fear avoidance; Patient's clinical presentation is evolving as her pain  is severe and she demonstrates  significant fear avoidance limiting PROM;    PT Frequency 2x / week   PT Duration 8 weeks   PT Treatment/Interventions ADLs/Self Care Home Management;Cryotherapy;Electrical Stimulation;Moist Heat;Therapeutic exercise;Therapeutic activities;Functional mobility training;Ultrasound;Patient/family education;Manual techniques;Taping;Energy conservation;Dry needling   PT Next Visit Plan PROM   PT Home Exercise Plan initiated HEP- see patient instructions   Consulted and Agree with Plan of Care Patient         Problem List Patient Active Problem List   Diagnosis Date Noted  . Pulmonary hypertension (HCC) 08/16/2012  . POTS (postural orthostatic tachycardia syndrome) 08/16/2012  . Chest pain 06/26/2012  . Shortness of breath 06/26/2012  . Rapid heart beat 06/26/2012    Keiron Iodice PT,DPT 11/17/2015, 11:37 AM  [facility] MAIN [address] [facility] SERVICES [address] [place], [place], [address] Phone: [phone]   Fax:  [phone]  Name: VICTORIA HENSHAW MRN: [id number] Date of Birth: 1965-09-26

## 2015-11-17 NOTE — Patient Instructions (Signed)
AROM: Elbow Flexion / Extension    With left hand palm up, gently bend elbow as far as possible. Then straighten arm as far as possible. Repeat _10___ times per set. Do _2___ sets per session. Do __2-3__ sessions per day.  Copyright  VHI. All rights reserved.  Forearm: Pronation / Supination    Keep left elbow bent to 90, wrist straight. HOLD NOTHING. Do not let elbow straighten or move from side. Pronation: Slowly turn palm down. Hold _2___ seconds. Supination: Slowly turn palm up. Hold _2___ seconds. Repeat _10___ times. Do __2-3__ sessions per day. CAUTION: Movement should be gentle, steady and slow.  Copyright  VHI. All rights reserved.  AROM: Wrist Flexion / Extension    Actively bend right wrist forward then back as far as possible. Repeat 10____ times per set. Do __2__ sets per session. Do _2-3___ sessions per day.  Copyright  VHI. All rights reserved.

## 2015-12-01 ENCOUNTER — Encounter: Payer: Self-pay | Admitting: Physical Therapy

## 2015-12-01 ENCOUNTER — Ambulatory Visit: Payer: 59 | Attending: Orthopedic Surgery

## 2015-12-01 DIAGNOSIS — R29898 Other symptoms and signs involving the musculoskeletal system: Secondary | ICD-10-CM

## 2015-12-01 DIAGNOSIS — M25512 Pain in left shoulder: Secondary | ICD-10-CM

## 2015-12-01 DIAGNOSIS — M6281 Muscle weakness (generalized): Secondary | ICD-10-CM | POA: Diagnosis not present

## 2015-12-01 DIAGNOSIS — M25612 Stiffness of left shoulder, not elsewhere classified: Secondary | ICD-10-CM | POA: Diagnosis not present

## 2015-12-01 NOTE — Therapy (Signed)
[place] [facility] MAIN [facility] SERVICES [address] [place], [place], [address] Phone: [phone]   Fax:  [phone]  Physical Therapy Treatment  Patient Details  Name: Claudia [name] MRN: [id number] Date of Birth: 1966-06-23 Referring Provider: Juanell Fairly MD  Encounter Date: 12/01/2015      PT End of Session - 12/01/15 0825    Visit Number 2   Number of Visits 17   Date for PT Re-Evaluation 01/12/16   PT Start Time 0805   PT Stop Time 0845   PT Time Calculation (min) 40 min   Activity Tolerance Patient limited by pain   Behavior During Therapy Anxious      Past Medical History  Diagnosis Date  . Asthma     H/O YEARS AGO-NO INHALERS  . Dysrhythmia     H/O [facility] PROBLEMS SINCE 2013  . Rotator cuff tear     left  . Difficult intubation   . Arthritis     knees, back;     Past Surgical History  Procedure Laterality Date  . Carpal tunnel release      x2  . Tonsillectomy    . Colonoscopy    . Cholecystectomy    . Shoulder arthroscopy with open rotator cuff repair Left 10/31/2015    Procedure: SHOULDER ARTHROSCOPY, SUBACROMIAL DECOMPRESSION, BICEPS TENOTOMY AND MINI OPEN ROTATOR CUFF REPAIR;  Surgeon: Juanell Fairly, MD;  Location: [facility] ORS;  Service: Orthopedics;  Laterality: Left;    There were no vitals filed for this visit.      Subjective Assessment - 12/01/15 0824    Subjective Pt reports she is doing well on this date. She is reporting low pain at 2/10 in L shoulder. She has been performing HEP without issue. No specific questions or concerns at this time.    Pertinent History personal factors affecting rehab: chronicity of left shoulder pain, fear avoidance, anxiety over injury; history of arthritis;    How long can you sit comfortably? 5-23min;    How long can you stand comfortably? NA   How long can you walk comfortably? NA   Diagnostic tests had MRI which showed full thickness tear prior to surgery;    Patient Stated Goals Get back to moving LUE; relieve some of pain;    Currently in Pain? Yes   Pain Score 2    Pain Location Shoulder   Pain Orientation Left   Pain Descriptors / Indicators Aching;Burning   Pain Type Surgical pain   Pain Onset 1 to 4 weeks ago        TREATMENT:  Manual Therapy  STM to distal biceps, patient positioned in recumbent position with pillow behind elbow preventing shoulder extension, 2# weight in hand to stretch elbow into extension; STM to L anterior, middle, and posterior deltoid; PROM L elbow flexion, extension. Able to achieve full elbow extension following elbow extension stretch with hand weight; PROM L elbow pronation and supination; PROM L shoulder for flexion, scaption, ER, and IR within tolerable range, no resistance; Intermittent gentle oscillations to aid in relaxation, pt with heavy guarding throughout PROM;  Estim IFC to L shoulder, attempted PROM during first few minutes to see if PROM can improve with IFC but no improvement noted; IFC to L shoulder x 10 minutes with CP applied to L shoulder; Educated about HEP and adding passive weighted stretches  PT Education - 12/01/15 0825    Education provided Yes   Education Details HEP reinforced, scar massage   Person(s) Educated Patient   Methods Explanation   Comprehension Verbalized understanding             PT Long Term Goals - 11/17/15 1132    PT LONG TERM GOAL #1   Title Patient will be independent in home exercise program to improve strength/mobility for better functional independence with ADLs. by 01/12/16   Time 8   Period Weeks   Status New   PT LONG TERM GOAL #2   Title Patient will decrease Quick DASH score by > 8 points demonstrating reduced self-reported upper extremity disability. by 01/12/16   Time 8   Period Weeks   Status New   PT LONG TERM GOAL #3   Title Patient will increase LUE shoulder AROM: sitting: Shoulder  flexion >120 degrees, abduction: >120 degrees, supine: ER: >50 degrees, IR: >50 degrees for increased functional ROM with ADLs such as grooming, dressing etc;    Time 8   Period Weeks   Status New   PT LONG TERM GOAL #4   Title Patient will increase LUE shoulder strength to >4/5 for increased functional strength with lifting groceries, doing household chores etc. by 01/12/16   Time 8   Period Weeks   Status New   PT LONG TERM GOAL #5   Title Patient will report a worst pain of 3/10 on VAS in    LUE         to improve tolerance with ADLs and reduced symptoms with activities.    Time 8   Period Weeks   Status New               Plan - 12/01/15 0825    Clinical Impression Statement Pt with heavy guarding of LUE which limits her ability to tolerate PROM. Attempted IFC to L shoulder with PROM but it does not help pt relax L shoulder. Pt also presents with decreased L elbow PROM extension. Able to achieve full extension after extensive stretching. Pt encouraged to perform recumbent L elbow extension stretch with palm up and holding 1-2# weight  (soup can/water bottle). Pt encouraged to continue HEP and follow-up as scheduled.    Rehab Potential Fair   Clinical Impairments Affecting Rehab Potential positive: motivated, negative: co-morbidities, fear avoidance; Patient's clinical presentation is evolving as her pain is severe and she demonstrates significant fear avoidance limiting PROM;    PT Frequency 2x / week   PT Duration 8 weeks   PT Treatment/Interventions ADLs/Self Care Home Management;Cryotherapy;Electrical Stimulation;Moist Heat;Therapeutic exercise;Therapeutic activities;Functional mobility training;Ultrasound;Patient/family education;Manual techniques;Taping;Energy conservation;Dry needling   PT Next Visit Plan PROM   PT Home Exercise Plan As prescribed, see instructions   Consulted and Agree with Plan of Care Patient      Patient will benefit from skilled therapeutic  intervention in order to improve the following deficits and impairments:  Decreased endurance, Obesity, Decreased knowledge of precautions, Decreased activity tolerance, Decreased strength, Impaired UE functional use, Pain, Increased muscle spasms, Decreased mobility, Decreased range of motion, Improper body mechanics, Postural dysfunction, Impaired flexibility  Visit Diagnosis: Shoulder stiffness, left  Left shoulder pain  Weakness of left arm     Problem List Patient Active Problem List   Diagnosis Date Noted  . Pulmonary hypertension (HCC) 08/16/2012  . POTS (postural orthostatic tachycardia syndrome) 08/16/2012  . Chest pain 06/26/2012  . Shortness of breath 06/26/2012  . Rapid heart beat  06/26/2012   Lynnea Maizes PT, DPT   Shelly Spenser 12/01/2015, 12:24 PM  [facility] MAIN [facility] SERVICES [address] [place], [place], [address] Phone: [phone]   Fax:  [phone]  Name: Claudia [name] MRN: [id number] Date of Birth: 10/14/1965

## 2015-12-07 ENCOUNTER — Encounter: Payer: Self-pay | Admitting: Physical Therapy

## 2015-12-07 ENCOUNTER — Ambulatory Visit: Payer: 59 | Admitting: Physical Therapy

## 2015-12-07 DIAGNOSIS — R29898 Other symptoms and signs involving the musculoskeletal system: Secondary | ICD-10-CM | POA: Diagnosis not present

## 2015-12-07 DIAGNOSIS — M25512 Pain in left shoulder: Secondary | ICD-10-CM

## 2015-12-07 DIAGNOSIS — M6281 Muscle weakness (generalized): Secondary | ICD-10-CM

## 2015-12-07 DIAGNOSIS — M25612 Stiffness of left shoulder, not elsewhere classified: Secondary | ICD-10-CM | POA: Diagnosis not present

## 2015-12-07 NOTE — Therapy (Signed)
[place] MAIN [facility] SERVICES [address] [place], [place], [address] Phone: [phone]   Fax:  [phone]  Physical Therapy Treatment  Patient Details  Name: Claudia [name] MRN: [id number] Date of Birth: 26-May-1966 Referring Provider: Thornton Park MD  Encounter Date: 12/07/2015      PT End of Session - 12/07/15 1725    Visit Number 3   Number of Visits 20   Date for PT Re-Evaluation 02/01/16   PT Start Time 1638   PT Stop Time 1733   PT Time Calculation (min) 55 min   Activity Tolerance Patient limited by pain   Behavior During Therapy Anxious      Past Medical History  Diagnosis Date  . Asthma     H/O YEARS AGO-NO INHALERS  . Dysrhythmia     H/O [facility] PROBLEMS SINCE 2013  . Rotator cuff tear     left  . Difficult intubation   . Arthritis     knees, back;     Past Surgical History  Procedure Laterality Date  . Carpal tunnel release      x2  . Tonsillectomy    . Colonoscopy    . Cholecystectomy    . Shoulder arthroscopy with open rotator cuff repair Left 10/31/2015    Procedure: SHOULDER ARTHROSCOPY, SUBACROMIAL DECOMPRESSION, BICEPS TENOTOMY AND MINI OPEN ROTATOR CUFF REPAIR;  Surgeon: Thornton Park, MD;  Location: [facility] ORS;  Service: Orthopedics;  Laterality: Left;    There were no vitals filed for this visit.      Subjective Assessment - 12/07/15 1655    Subjective Patient presents to therapy without sling; Patient reports going to see Dr. Mack Guise and said that he mentioned that she was progressing well; She reports trying to rest arm into elbow extension more and she was told she could do light lifting in LUE; She reports taking pain medicine prior to PT appointment; She is sleeping in the bed with lots of pillows;    Pertinent History personal factors affecting rehab: chronicity of left shoulder pain, fear avoidance, anxiety over injury; history of arthritis;    How long can you sit comfortably?  5-75mn;    How long can you stand comfortably? NA   How long can you walk comfortably? NA   Diagnostic tests had MRI which showed full thickness tear prior to surgery;    Patient Stated Goals Get back to moving LUE; relieve some of pain;    Currently in Pain? Yes   Pain Score 5    Pain Location Shoulder   Pain Orientation Left   Pain Descriptors / Indicators Aching   Pain Type Surgical pain   Pain Onset 1 to 4 weeks ago            [facility]    PROM   Left Shoulder Flexion 70 Degrees  improved from 20 degrees   Left Shoulder ABduction 75 Degrees  improved from 50 degrees         TREATMENT:  Manual Therapy Patient in semi-reclined position; She required mod VCs to relax LUE for better tolerance of ROM; PROM of shoulder abduction and flexion limited to 70-90 degrees approximately due to increased pain;  PROM L elbow flexion, extension. Able to achieve full elbow extension following elbow extension stretch x2 min;  PROM L elbow pronation and supination; PROM L shoulder for flexion, scaption, ER, and IR within tolerable range, no resistance x10 reps each direction;  Intermittent gentle  oscillations to aid in relaxation, pt with heavy guarding throughout PROM;  Seated with UE ranger: Shoulder/elbow flexion/extension, shoulder circles, shoulder horizontal abduction/adduction x4-5 min with cues to go to end range of tolerance and relax shoulder elevation for better glenohumeral ROM; Standing: LUE pendulums, AAROM with cues for increased elbow extension 1# handweight, shoulder flexion/extenions, circles, and horizontal abduction/adduction x10 each;  Educated patient in increasing LUE arm swing during gait for better elbow extension and better shoulder ROM during functional task;   PT assessed LUE shoulder PROM, see above; She is still pain focused and will resist ROM at end range increasing her pain; She has difficulty relaxing during PROM;   Estim Interferential TENs to left shoulder in sitting concurrent with cryotherapy x15 min at tolerated intensity; Patient reports less pain in shoulder after treatment session;                      PT Education - 12/07/15 1725    Education provided Yes   Education Details HEP reinforced, pendulums, reciprocal arm swing with gait;    Person(s) Educated Patient   Methods Explanation;Demonstration;Verbal cues   Comprehension Verbalized understanding;Returned demonstration;Verbal cues required             PT Long Term Goals - 12/07/15 1728    PT LONG TERM GOAL #1   Title Patient will be independent in home exercise program to improve strength/mobility for better functional independence with ADLs. by 02/01/16   Time 8   Period Weeks   Status On-going   PT LONG TERM GOAL #2   Title Patient will decrease Quick DASH score by > 8 points demonstrating reduced self-reported upper extremity disability. by 02/01/16   Time 8   Period Weeks   Status On-going   PT LONG TERM GOAL #3   Title Patient will increase LUE shoulder AROM: sitting: Shoulder flexion >120 degrees, abduction: >120 degrees, supine: ER: >50 degrees, IR: >50 degrees for increased functional ROM with ADLs such as grooming, dressing etc; 02/01/16   Time 8   Period Weeks   Status On-going   PT LONG TERM GOAL #4   Title Patient will increase LUE shoulder strength to >4/5 for increased functional strength with lifting groceries, doing household chores etc. by 02/01/16   Time 8   Period Weeks   Status Not Met   PT LONG TERM GOAL #5   Title Patient will report a worst pain of 3/10 on VAS in    LUE         to improve tolerance with ADLs and reduced symptoms with activities. by 02/01/16   Time 8   Period Weeks   Status On-going               Plan - 12/07/15 1726    Clinical Impression Statement Patient continues to be limited by pain with LUE PROM. She has heavy guarding and will resist end range due  to pain and discomfort. PT has tried multiple relaxation technique including trying PROM on right side, TENs during PROM, and gentle glenohumeral oscillations however patient still anxious and resistant. Educated patient in advanced elbow extension exercise including pendulums and reciprocal arm swing with gait task. She would benefit from additional skilled PT Intervention to improve shoulder ROM and reduce pain with ADLs.    Rehab Potential Fair   Clinical Impairments Affecting Rehab Potential positive: motivated, negative: co-morbidities, fear avoidance; Patient's clinical presentation is evolving as her pain is severe and she demonstrates significant  fear avoidance limiting PROM;    PT Frequency 2x / week   PT Duration 8 weeks   PT Treatment/Interventions ADLs/Self Care Home Management;Cryotherapy;Electrical Stimulation;Moist Heat;Therapeutic exercise;Therapeutic activities;Functional mobility training;Ultrasound;Patient/family education;Manual techniques;Taping;Energy conservation;Dry needling   PT Next Visit Plan PROM   PT Home Exercise Plan continue as given;    Consulted and Agree with Plan of Care Patient      Patient will benefit from skilled therapeutic intervention in order to improve the following deficits and impairments:  Decreased endurance, Obesity, Decreased knowledge of precautions, Decreased activity tolerance, Decreased strength, Impaired UE functional use, Pain, Increased muscle spasms, Decreased mobility, Decreased range of motion, Improper body mechanics, Postural dysfunction, Impaired flexibility  Visit Diagnosis: Left shoulder pain - Plan: PT plan of care cert/re-cert  Stiffness of left shoulder, not elsewhere classified - Plan: PT plan of care cert/re-cert  Muscle weakness (generalized) - Plan: PT plan of care cert/re-cert     Problem List Patient Active Problem List   Diagnosis Date Noted  . Pulmonary hypertension ([place]) 08/16/2012  . POTS (postural orthostatic  tachycardia syndrome) 08/16/2012  . Chest pain 06/26/2012  . Shortness of breath 06/26/2012  . Rapid heart beat 06/26/2012    Shyasia Funches PT, DPT 12/07/2015, 5:30 PM  [place] MAIN [facility] SERVICES [address] [place], [place], [address] Phone: [phone]   Fax:  [phone]  Name: KAMEELA LEIPOLD MRN: [id number] Date of Birth: March 06, 1966

## 2015-12-11 ENCOUNTER — Ambulatory Visit: Payer: 59 | Admitting: Physical Therapy

## 2015-12-11 ENCOUNTER — Encounter: Payer: Self-pay | Admitting: Physical Therapy

## 2015-12-11 DIAGNOSIS — M25512 Pain in left shoulder: Secondary | ICD-10-CM | POA: Diagnosis not present

## 2015-12-11 DIAGNOSIS — R29898 Other symptoms and signs involving the musculoskeletal system: Secondary | ICD-10-CM | POA: Diagnosis not present

## 2015-12-11 DIAGNOSIS — M6281 Muscle weakness (generalized): Secondary | ICD-10-CM

## 2015-12-11 DIAGNOSIS — M25612 Stiffness of left shoulder, not elsewhere classified: Secondary | ICD-10-CM

## 2015-12-11 NOTE — Therapy (Signed)
[place] MAIN [facility] SERVICES [address] [place], [place], [address] Phone: [phone]   Fax:  [phone]  Physical Therapy Treatment  Patient Details  Name: Claudia [name] MRN: [id number] Date of Birth: 1965/12/21 Referring Provider: Thornton Park MD  Encounter Date: 12/11/2015      PT End of Session - 12/11/15 0839    Visit Number 4   Number of Visits 20   Date for PT Re-Evaluation 02/01/16   PT Start Time 0802   PT Stop Time 0845   PT Time Calculation (min) 43 min   Activity Tolerance Patient limited by pain   Behavior During Therapy Anxious      Past Medical History  Diagnosis Date  . Asthma     H/O YEARS AGO-NO INHALERS  . Dysrhythmia     H/O [facility] PROBLEMS SINCE 2013  . Rotator cuff tear     left  . Difficult intubation   . Arthritis     knees, back;     Past Surgical History  Procedure Laterality Date  . Carpal tunnel release      x2  . Tonsillectomy    . Colonoscopy    . Cholecystectomy    . Shoulder arthroscopy with open rotator cuff repair Left 10/31/2015    Procedure: SHOULDER ARTHROSCOPY, SUBACROMIAL DECOMPRESSION, BICEPS TENOTOMY AND MINI OPEN ROTATOR CUFF REPAIR;  Surgeon: Thornton Park, MD;  Location: [facility] ORS;  Service: Orthopedics;  Laterality: Left;    There were no vitals filed for this visit.      Subjective Assessment - 12/11/15 0811    Subjective Patient reports increased left shoulder pain this morning. She reports taking her pain meds about 1 hour before therapy; She reports still using her stimulator and ice pack for pain control;    Pertinent History personal factors affecting rehab: chronicity of left shoulder pain, fear avoidance, anxiety over injury; history of arthritis;    How long can you sit comfortably? 5-33mn;    How long can you stand comfortably? NA   How long can you walk comfortably? NA   Diagnostic tests had MRI which showed full thickness tear prior to surgery;     Patient Stated Goals Get back to moving LUE; relieve some of pain;    Currently in Pain? Yes   Pain Score 5    Pain Location Shoulder   Pain Orientation Left   Pain Descriptors / Indicators Aching;Sore   Pain Type Surgical pain   Pain Onset 1 to 4 weeks ago      TREATMENT: Instructed patient in pulley AAROM but patient unable to initiate left shoulder motion due to pain and also impaired coordination;  Instructed patient in seated LUE on table, table slides AAROM: Shoulder flexion x10 reps; Shoulder Abduction AAROM x10 reps;  Patient supine: Wand chest press AAROM x5 reps; Wand shoulder flexion AAROM x8 reps with cues to avoid painful ROM;   Instructed patient in isometric: Shoulder flexion 3 sec hold x5 with cues to avoid excessive force and to avoid body movement Shoulder abduction 3 sec hold x5; Shoulder extension 3 sec hold x5;  Sitting with LUE in UE ranger: Alphabet spelling out name x1 reps; Circles clockwise/counterclockwise x10 each;  Patient required min-moderate verbal/tactile cues for correct exercise technique including to avoid shoulder elevation with ROM exercise, to relax LUE movement during AAROM exercise for better tolerance and to avoid painful ROM;     Finished with cryotherapy in sitting x5 min (unbilled);  PT Education - 12/11/15 8341    Education provided Yes   Education Details HEP advanced with Cyndia Skeeters) Educated Patient   Methods Explanation;Verbal cues;Handout;Demonstration   Comprehension Verbalized understanding;Returned demonstration;Verbal cues required             PT Long Term Goals - 12/07/15 1728    PT LONG TERM GOAL #1   Title Patient will be independent in home exercise program to improve strength/mobility for better functional independence with ADLs. by 02/01/16   Time 8   Period Weeks   Status On-going   PT LONG TERM GOAL #2   Title Patient will decrease Quick DASH score  by > 8 points demonstrating reduced self-reported upper extremity disability. by 02/01/16   Time 8   Period Weeks   Status On-going   PT LONG TERM GOAL #3   Title Patient will increase LUE shoulder AROM: sitting: Shoulder flexion >120 degrees, abduction: >120 degrees, supine: ER: >50 degrees, IR: >50 degrees for increased functional ROM with ADLs such as grooming, dressing etc; 02/01/16   Time 8   Period Weeks   Status On-going   PT LONG TERM GOAL #4   Title Patient will increase LUE shoulder strength to >4/5 for increased functional strength with lifting groceries, doing household chores etc. by 02/01/16   Time 8   Period Weeks   Status Not Met   PT LONG TERM GOAL #5   Title Patient will report a worst pain of 3/10 on VAS in    LUE         to improve tolerance with ADLs and reduced symptoms with activities. by 02/01/16   Time 8   Period Weeks   Status On-going               Plan - 12/11/15 0839    Clinical Impression Statement Patient instructed in [facility] for left shoulder movement. She continues to be limited by pain and is anxious about initiating range of motion exercise. Patient was able to demonstrate better ROM with extensive instruction with AAROM. She required cues to avoid shoulder elevation during ROM exercise. She would benefit from additional skilled PT Intervention to improve shoulder ROM and reduce pain with ADLs.     Rehab Potential Poor   Clinical Impairments Affecting Rehab Potential positive: motivated, negative: co-morbidities, fear avoidance; Patient's clinical presentation is evolving as her pain is severe and she demonstrates significant fear avoidance limiting PROM;    PT Frequency 2x / week   PT Duration 8 weeks   PT Treatment/Interventions ADLs/Self Care Home Management;Cryotherapy;Electrical Stimulation;Moist Heat;Therapeutic exercise;Therapeutic activities;Functional mobility training;Ultrasound;Patient/family education;Manual techniques;Taping;Energy  conservation;Dry needling   PT Next Visit Plan PROM   PT Home Exercise Plan continue as given;    Consulted and Agree with Plan of Care Patient      Patient will benefit from skilled therapeutic intervention in order to improve the following deficits and impairments:  Decreased endurance, Obesity, Decreased knowledge of precautions, Decreased activity tolerance, Decreased strength, Impaired UE functional use, Pain, Increased muscle spasms, Decreased mobility, Decreased range of motion, Improper body mechanics, Postural dysfunction, Impaired flexibility  Visit Diagnosis: Left shoulder pain  Stiffness of left shoulder, not elsewhere classified  Muscle weakness (generalized)     Problem List Patient Active Problem List   Diagnosis Date Noted  . Pulmonary hypertension ([place]) 08/16/2012  . POTS (postural orthostatic tachycardia syndrome) 08/16/2012  . Chest pain 06/26/2012  . Shortness of breath 06/26/2012  . Rapid heart beat 06/26/2012  Trotter,Margaret PT, DPT 12/11/2015, 8:43 AM  [place] MAIN [facility] SERVICES [address] [place], [place], [address] Phone: [phone]   Fax:  [phone]  Name: LATIFAH PADIN MRN: [id number] Date of Birth: [birth year]

## 2015-12-11 NOTE — Patient Instructions (Signed)
    Flexion (Passive)    Sitting upright, slide forearm forward along table, bending from the waist until a stretch is felt. Hold __2-3__ seconds. Repeat __10__ times. Do _2___ sessions per day.  Copyright  VHI. All rights reserved.  Abduction (Passive)    With arm out to side, resting on table, lower head toward arm, keeping trunk away from table. Hold _2-3___ seconds. Repeat _10___ times. Do __2_ sessions per day.  Copyright  VHI. All rights reserved.  Press-Up With Wand    Press wand up until elbows are straight, let right arm do all the work; only lift to point of comfort;  Hold _5___ seconds. Repeat _10__ times. Do _2___ sessions per day.  Copyright  VHI. All rights reserved.  ROM: Flexion - Wand (Supine)    Lie on back holding wand. Raise arms over head to point of comfort;   Repeat _5___ times per set. Do __2__ sets per session. Do _2___ sessions per day.  http://orth.exer.us/929   Copyright  VHI. All rights reserved.  Flexion (Isometric)    Press right fist against wall. Hold _3___ seconds. (Do not push too hard, your body should not move) Repeat __5__ times. Do __2-3__ sessions per day.  http://gt2.exer.us/114   Copyright  VHI. All rights reserved.  SHOULDER: Abduction (Isometric)    Use wall as resistance. Press arm against pillow. Keep elbow straight. Hold _5__ seconds. _5__ reps per set, __2-3_ sets per day, _5__ days per week  Copyright  VHI. All rights reserved.  Extension (Isometric)    Place left bent elbow and back of arm against wall. Press elbow against wall. Hold __5__ seconds. Repeat _2__ times. Do __2-3__ sessions per day.  http://gt2.exer.us/112   Copyright  VHI. All rights reserved.

## 2015-12-13 ENCOUNTER — Encounter: Payer: Self-pay | Admitting: Physical Therapy

## 2015-12-13 ENCOUNTER — Ambulatory Visit: Payer: 59 | Admitting: Physical Therapy

## 2015-12-13 DIAGNOSIS — M25612 Stiffness of left shoulder, not elsewhere classified: Secondary | ICD-10-CM | POA: Diagnosis not present

## 2015-12-13 DIAGNOSIS — M6281 Muscle weakness (generalized): Secondary | ICD-10-CM | POA: Diagnosis not present

## 2015-12-13 DIAGNOSIS — M25512 Pain in left shoulder: Secondary | ICD-10-CM

## 2015-12-13 DIAGNOSIS — R29898 Other symptoms and signs involving the musculoskeletal system: Secondary | ICD-10-CM | POA: Diagnosis not present

## 2015-12-13 NOTE — Therapy (Signed)
[place] MAIN [facility] SERVICES [address] [place], [place], [address] Phone: [phone]   Fax:  [phone]  Physical Therapy Treatment  Patient Details  Name: Claudia [name] MRN: [id number] Date of Birth: Nov 24, 1965 Referring Provider: Thornton Park MD  Encounter Date: 12/13/2015      PT End of Session - 12/13/15 0841    Visit Number 5   Number of Visits 20   Date for PT Re-Evaluation 02/01/16   PT Start Time 0804   PT Stop Time 0845   PT Time Calculation (min) 41 min   Activity Tolerance Patient limited by pain   Behavior During Therapy Anxious      Past Medical History  Diagnosis Date  . Asthma     H/O YEARS AGO-NO INHALERS  . Dysrhythmia     H/O [facility] PROBLEMS SINCE 2013  . Rotator cuff tear     left  . Difficult intubation   . Arthritis     knees, back;     Past Surgical History  Procedure Laterality Date  . Carpal tunnel release      x2  . Tonsillectomy    . Colonoscopy    . Cholecystectomy    . Shoulder arthroscopy with open rotator cuff repair Left 10/31/2015    Procedure: SHOULDER ARTHROSCOPY, SUBACROMIAL DECOMPRESSION, BICEPS TENOTOMY AND MINI OPEN ROTATOR CUFF REPAIR;  Surgeon: Thornton Park, MD;  Location: [facility] ORS;  Service: Orthopedics;  Laterality: Left;    There were no vitals filed for this visit.      Subjective Assessment - 12/13/15 0810    Subjective Patient reports increased left shoulder pain today which could be related to bad weather; she reports having difficulty with new exercises for HEP;    Pertinent History personal factors affecting rehab: chronicity of left shoulder pain, fear avoidance, anxiety over injury; history of arthritis;    How long can you sit comfortably? 5-24mn;    How long can you stand comfortably? NA   How long can you walk comfortably? NA   Diagnostic tests had MRI which showed full thickness tear prior to surgery;    Patient Stated Goals Get back to  moving LUE; relieve some of pain;    Currently in Pain? Yes   Pain Score 3    Pain Location Shoulder   Pain Orientation Left   Pain Descriptors / Indicators Aching;Sore   Pain Type Surgical pain   Pain Onset 1 to 4 weeks ago          TREATMENT: LUE finger ladder shoulder flexion AAROM with cues to avoid shoulder elevation and to avoid lumbar extension to isolate shoulder flexion x10 reps;  Patient supine: Wand chest press AAROM x10 reps; Wand shoulder flexion AAROM x10 reps with cues to avoid painful ROM;  Wand shoulder abduction AAROM x10 reps with cues to avoid shoulder elevation;   PT performed extensive manual therapy: Patient supine: LUE shoulder PROM x5 reps each direction with cues to relax during end range; patient continues to have heavy guarding in shoulder due to pain especially with shoulder flexion;  Manual resistance for isometric in scaption plane: Shoulder flexion 3 sec hold x5 Shoulder abduction 3 sec hold x5; Shoulder extension 3 sec hold x5; Shoulder IR/ER 3 sec hold x5 reps each;  Patient required min-moderate verbal/tactile cues for correct exercise technique especially to avoid shoulder elevation and trunk lean as compensation to avoid true glenohumeral joint movement;   Finished with cryotherapy to left shoulder in  sitting x5 min (unbilled);                         PT Education - 12/13/15 0841    Education provided Yes   Education Details HEP reinforced, shoulder ROM   Person(s) Educated Patient   Methods Explanation;Verbal cues   Comprehension Verbalized understanding;Verbal cues required;Returned demonstration             PT Long Term Goals - 12/07/15 1728    PT LONG TERM GOAL #1   Title Patient will be independent in home exercise program to improve strength/mobility for better functional independence with ADLs. by 02/01/16   Time 8   Period Weeks   Status On-going   PT LONG TERM GOAL #2   Title Patient will  decrease Quick DASH score by > 8 points demonstrating reduced self-reported upper extremity disability. by 02/01/16   Time 8   Period Weeks   Status On-going   PT LONG TERM GOAL #3   Title Patient will increase LUE shoulder AROM: sitting: Shoulder flexion >120 degrees, abduction: >120 degrees, supine: ER: >50 degrees, IR: >50 degrees for increased functional ROM with ADLs such as grooming, dressing etc; 02/01/16   Time 8   Period Weeks   Status On-going   PT LONG TERM GOAL #4   Title Patient will increase LUE shoulder strength to >4/5 for increased functional strength with lifting groceries, doing household chores etc. by 02/01/16   Time 8   Period Weeks   Status Not Met   PT LONG TERM GOAL #5   Title Patient will report a worst pain of 3/10 on VAS in    LUE         to improve tolerance with ADLs and reduced symptoms with activities. by 02/01/16   Time 8   Period Weeks   Status On-going               Plan - 12/13/15 4801    Clinical Impression Statement Instructed patient in shoulder ROM exercise, advancing AAROM. Patient continues to have heavy guarding with less shoulder movement at end range particularly in shoulder flexion. She was able to demonstrate improved ROM after isometric and PROM with less guarding. Patient would benefit from additional skilled PT Intervention to improve shoulder ROM, reduce pain and return to PLOF.    Rehab Potential Poor   Clinical Impairments Affecting Rehab Potential positive: motivated, negative: co-morbidities, fear avoidance; Patient's clinical presentation is evolving as her pain is severe and she demonstrates significant fear avoidance limiting PROM;    PT Frequency 2x / week   PT Duration 8 weeks   PT Treatment/Interventions ADLs/Self Care Home Management;Cryotherapy;Electrical Stimulation;Moist Heat;Therapeutic exercise;Therapeutic activities;Functional mobility training;Ultrasound;Patient/family education;Manual techniques;Taping;Energy  conservation;Dry needling   PT Next Visit Plan PROM   PT Home Exercise Plan continue as given;    Consulted and Agree with Plan of Care Patient      Patient will benefit from skilled therapeutic intervention in order to improve the following deficits and impairments:  Decreased endurance, Obesity, Decreased knowledge of precautions, Decreased activity tolerance, Decreased strength, Impaired UE functional use, Pain, Increased muscle spasms, Decreased mobility, Decreased range of motion, Improper body mechanics, Postural dysfunction, Impaired flexibility  Visit Diagnosis: Left shoulder pain  Stiffness of left shoulder, not elsewhere classified  Muscle weakness (generalized)     Problem List Patient Active Problem List   Diagnosis Date Noted  . Pulmonary hypertension ([place]) 08/16/2012  . POTS (postural orthostatic  tachycardia syndrome) 08/16/2012  . Chest pain 06/26/2012  . Shortness of breath 06/26/2012  . Rapid heart beat 06/26/2012    Joeangel Jeanpaul PT, DPT 12/13/2015, 9:55 AM  [place] MAIN [facility] SERVICES [place], [place], [address] Phone: [phone]   Fax:  [phone]  Name: MITA VALLO MRN: [id number] Date of Birth: Mar 20, 1966

## 2015-12-18 ENCOUNTER — Ambulatory Visit: Payer: 59 | Admitting: Physical Therapy

## 2015-12-18 ENCOUNTER — Encounter: Payer: Self-pay | Admitting: Physical Therapy

## 2015-12-18 DIAGNOSIS — M6281 Muscle weakness (generalized): Secondary | ICD-10-CM

## 2015-12-18 DIAGNOSIS — M25512 Pain in left shoulder: Secondary | ICD-10-CM | POA: Diagnosis not present

## 2015-12-18 DIAGNOSIS — M25612 Stiffness of left shoulder, not elsewhere classified: Secondary | ICD-10-CM | POA: Diagnosis not present

## 2015-12-18 DIAGNOSIS — R29898 Other symptoms and signs involving the musculoskeletal system: Secondary | ICD-10-CM | POA: Diagnosis not present

## 2015-12-18 NOTE — Therapy (Signed)
[place] MAIN [facility] SERVICES [address] [place], [place], [address] Phone: [phone]   Fax:  [phone]  Physical Therapy Treatment  Patient Details  Name: Claudia [name] MRN: [id number] Date of Birth: 05/21/1966 Referring Provider: Thornton Park MD  Encounter Date: 12/18/2015      PT End of Session - 12/18/15 0820    Visit Number 6   Number of Visits 20   Date for PT Re-Evaluation 02/01/16   PT Start Time 0804   PT Stop Time 0850   PT Time Calculation (min) 46 min   Activity Tolerance Patient limited by pain   Behavior During Therapy Anxious      Past Medical History  Diagnosis Date  . Asthma     H/O YEARS AGO-NO INHALERS  . Dysrhythmia     H/O [facility] PROBLEMS SINCE 2013  . Rotator cuff tear     left  . Difficult intubation   . Arthritis     knees, back;     Past Surgical History  Procedure Laterality Date  . Carpal tunnel release      x2  . Tonsillectomy    . Colonoscopy    . Cholecystectomy    . Shoulder arthroscopy with open rotator cuff repair Left 10/31/2015    Procedure: SHOULDER ARTHROSCOPY, SUBACROMIAL DECOMPRESSION, BICEPS TENOTOMY AND MINI OPEN ROTATOR CUFF REPAIR;  Surgeon: Thornton Park, MD;  Location: [facility] ORS;  Service: Orthopedics;  Laterality: Left;    There were no vitals filed for this visit.      Subjective Assessment - 12/18/15 0812    Subjective Patient reports, "I'm trying the exercises at home. I get frustrated because I don't think that I'm getting better. I know that it takes time though"   Pertinent History personal factors affecting rehab: chronicity of left shoulder pain, fear avoidance, anxiety over injury; history of arthritis;    How long can you sit comfortably? 5-93mn;    How long can you stand comfortably? NA   How long can you walk comfortably? NA   Diagnostic tests had MRI which showed full thickness tear prior to surgery;    Patient Stated Goals Get back to  moving LUE; relieve some of pain;    Currently in Pain? Yes   Pain Score 5    Pain Location Shoulder   Pain Orientation Left   Pain Descriptors / Indicators Aching;Sore   Pain Type Surgical pain   Pain Onset 1 to 4 weeks ago      TREATMENT: LUE AAROM, table slides, shoulder flexion/abduction x10 each; UE ranger, LUE only, shoulder flexion/extension x10 reps, circles clockwise/counterclockwise x10 each; Patient required mod A with UE ranger to facilitate better AAROM and promote better ROM;  LUE finger wall, x5 reps with cues to increase ROM with each repetition;  Patient supine: Wand chest press AAROM x15 reps; Wand shoulder flexion AAROM x10 reps with cues to avoid painful ROM;  Wand shoulder abduction AAROM x10 reps with cues to avoid shoulder elevation;   PT performed  manual therapy: Shoulder PROM in supine: flexion 78 degrees, abduction 80 degrees with pain at end range; Patient supine: LUE shoulder PROM x5 reps each direction with cues to relax during end range; patient continues to have heavy guarding in shoulder due to pain especially with shoulder flexion;  Grade II glenohumeral joint mobs to LUE shoulder inferior, posterior and anterior 15 sec bouts x3 reps each;  Patient required min-moderate verbal/tactile cues for correct exercise technique especially  to avoid shoulder elevation and trunk lean as compensation to improve true glenohumeral joint movement;   Finished with cryotherapy to left shoulder in sitting x5 min (unbilled);                             PT Education - 12/18/15 0819    Education provided Yes   Education Details HEP reinforced, shoulder ROM   Person(s) Educated Patient   Methods Explanation;Verbal cues   Comprehension Verbalized understanding;Returned demonstration;Verbal cues required             PT Long Term Goals - 12/07/15 1728    PT LONG TERM GOAL #1   Title Patient will be independent in home exercise  program to improve strength/mobility for better functional independence with ADLs. by 02/01/16   Time 8   Period Weeks   Status On-going   PT LONG TERM GOAL #2   Title Patient will decrease Quick DASH score by > 8 points demonstrating reduced self-reported upper extremity disability. by 02/01/16   Time 8   Period Weeks   Status On-going   PT LONG TERM GOAL #3   Title Patient will increase LUE shoulder AROM: sitting: Shoulder flexion >120 degrees, abduction: >120 degrees, supine: ER: >50 degrees, IR: >50 degrees for increased functional ROM with ADLs such as grooming, dressing etc; 02/01/16   Time 8   Period Weeks   Status On-going   PT LONG TERM GOAL #4   Title Patient will increase LUE shoulder strength to >4/5 for increased functional strength with lifting groceries, doing household chores etc. by 02/01/16   Time 8   Period Weeks   Status Not Met   PT LONG TERM GOAL #5   Title Patient will report a worst pain of 3/10 on VAS in    LUE         to improve tolerance with ADLs and reduced symptoms with activities. by 02/01/16   Time 8   Period Weeks   Status On-going               Plan - 12/18/15 0820    Clinical Impression Statement Patient continues to have heavy guarding and discomfort in LUE shoulder with AAROM/PROM; she reports doing her exercises only 1x a day; PT instructed patient to perform exercises 3-5 times a day to work on increased flexibility; She responds well to cryotherapy and uses her TENs unit at home which helps manage her pain. Patient would benefit from additional skilled PT intervention to improve shoulder ROM and reduce pain with ADLs.    Rehab Potential Poor   Clinical Impairments Affecting Rehab Potential positive: motivated, negative: co-morbidities, fear avoidance; Patient's clinical presentation is evolving as her pain is severe and she demonstrates significant fear avoidance limiting PROM;    PT Frequency 2x / week   PT Duration 8 weeks   PT  Treatment/Interventions ADLs/Self Care Home Management;Cryotherapy;Electrical Stimulation;Moist Heat;Therapeutic exercise;Therapeutic activities;Functional mobility training;Ultrasound;Patient/family education;Manual techniques;Taping;Energy conservation;Dry needling   PT Next Visit Plan PROM   PT Home Exercise Plan continue as given;    Consulted and Agree with Plan of Care Patient      Patient will benefit from skilled therapeutic intervention in order to improve the following deficits and impairments:  Decreased endurance, Obesity, Decreased knowledge of precautions, Decreased activity tolerance, Decreased strength, Impaired UE functional use, Pain, Increased muscle spasms, Decreased mobility, Decreased range of motion, Improper body mechanics, Postural dysfunction, Impaired flexibility  Visit  Diagnosis: Left shoulder pain  Stiffness of left shoulder, not elsewhere classified  Muscle weakness (generalized)     Problem List Patient Active Problem List   Diagnosis Date Noted  . Pulmonary hypertension ([place]) 08/16/2012  . POTS (postural orthostatic tachycardia syndrome) 08/16/2012  . Chest pain 06/26/2012  . Shortness of breath 06/26/2012  . Rapid heart beat 06/26/2012    Trotter,Margaret PT, DPT 12/18/2015, 8:45 AM  [place] MAIN [facility] SERVICES [address] [place], [place], [address] Phone: [phone]   Fax:  [phone]  Name: NISHI NEISWONGER MRN: [id number] Date of Birth: June 04, 1966

## 2015-12-20 ENCOUNTER — Encounter: Payer: Self-pay | Admitting: Physical Therapy

## 2015-12-20 ENCOUNTER — Other Ambulatory Visit: Payer: Self-pay | Admitting: Obstetrics and Gynecology

## 2015-12-20 ENCOUNTER — Ambulatory Visit: Payer: 59 | Admitting: Physical Therapy

## 2015-12-20 DIAGNOSIS — M25612 Stiffness of left shoulder, not elsewhere classified: Secondary | ICD-10-CM | POA: Diagnosis not present

## 2015-12-20 DIAGNOSIS — Z124 Encounter for screening for malignant neoplasm of cervix: Secondary | ICD-10-CM | POA: Diagnosis not present

## 2015-12-20 DIAGNOSIS — Z01419 Encounter for gynecological examination (general) (routine) without abnormal findings: Secondary | ICD-10-CM | POA: Diagnosis not present

## 2015-12-20 DIAGNOSIS — M6281 Muscle weakness (generalized): Secondary | ICD-10-CM | POA: Diagnosis not present

## 2015-12-20 DIAGNOSIS — R29898 Other symptoms and signs involving the musculoskeletal system: Secondary | ICD-10-CM | POA: Diagnosis not present

## 2015-12-20 DIAGNOSIS — M25512 Pain in left shoulder: Secondary | ICD-10-CM

## 2015-12-20 DIAGNOSIS — Z13820 Encounter for screening for osteoporosis: Secondary | ICD-10-CM

## 2015-12-20 DIAGNOSIS — Z1231 Encounter for screening mammogram for malignant neoplasm of breast: Secondary | ICD-10-CM

## 2015-12-20 NOTE — Therapy (Signed)
[place] MAIN [facility] SERVICES [address] [place], [place], [address] Phone: [phone]   Fax:  [phone]  Physical Therapy Treatment  Patient Details  Name: Claudia [name] MRN: [id number] Date of Birth: May 09, 1966 Referring Provider: Thornton Park MD  Encounter Date: 12/20/2015      PT End of Session - 12/20/15 0821    Visit Number 7   Number of Visits 20   Date for PT Re-Evaluation 02/01/16   PT Start Time 0801   PT Stop Time 0850   PT Time Calculation (min) 49 min   Activity Tolerance Patient limited by pain   Behavior During Therapy Anxious      Past Medical History  Diagnosis Date  . Asthma     H/O YEARS AGO-NO INHALERS  . Dysrhythmia     H/O [facility] PROBLEMS SINCE 2013  . Rotator cuff tear     left  . Difficult intubation   . Arthritis     knees, back;     Past Surgical History  Procedure Laterality Date  . Carpal tunnel release      x2  . Tonsillectomy    . Colonoscopy    . Cholecystectomy    . Shoulder arthroscopy with open rotator cuff repair Left 10/31/2015    Procedure: SHOULDER ARTHROSCOPY, SUBACROMIAL DECOMPRESSION, BICEPS TENOTOMY AND MINI OPEN ROTATOR CUFF REPAIR;  Surgeon: Thornton Park, MD;  Location: [facility] ORS;  Service: Orthopedics;  Laterality: Left;    There were no vitals filed for this visit.      Subjective Assessment - 12/20/15 0807    Subjective Patient reports increased left shoulder pain today; "Its been hurting since 2 AM this morning." She reports using her TENs unit more and has been sleeping with TENs unit on, being on for >5 hours at a time;    Pertinent History personal factors affecting rehab: chronicity of left shoulder pain, fear avoidance, anxiety over injury; history of arthritis;    How long can you sit comfortably? 5-67mn;    How long can you stand comfortably? NA   How long can you walk comfortably? NA   Diagnostic tests had MRI which showed full thickness tear  prior to surgery;    Patient Stated Goals Get back to moving LUE; relieve some of pain;    Currently in Pain? Yes   Pain Score 8    Pain Location Shoulder   Pain Orientation Left;Posterior   Pain Descriptors / Indicators Sore   Pain Type Surgical pain   Pain Onset 1 to 4 weeks ago            [facility] Assessment - 12/20/15 0001    PROM   Left Shoulder Flexion 85 Degrees  in supine   Left Shoulder ABduction 80 Degrees  in supine       TREATMENT: Seated, left upper trap and levator scapulae stretch 15 sec hold x2 each; LUE finger wall, x5 reps with cues to increase ROM with each repetition and tactile cues to reduce shoulder elevation for better ROM;  Patient supine: Wand chest press AAROM x10 reps; with min VCs to increase elbow extension as pushing up; Patient also educated to be sure not to be doing shoulder press at home with wand as she lays down with extra pillows and may not be doing chest press correctly;  Wand shoulder flexion AAROM x10 reps with cues to avoid painful ROM;  Wand shoulder abduction AAROM x10 reps with cues to avoid shoulder  elevation and to increase shoulder AAROM rather than just elbow extension;   PT performed manual therapy: Patient supine: LUE shoulder PROM x5 reps each direction with cues to relax during end range; patient continues to have heavy guarding in shoulder due to pain especially with shoulder flexion;  Grade II glenohumeral joint mobs to LUE shoulder inferior, posterior and anterior 15 sec bouts x4 reps each; Isometric LUE shoulder flexion, abduction, extension, IR/ER 3 sec hold x5 each, with minimal manual resistance;  Patient required min-moderate verbal/tactile cues for correct exercise technique especially to avoid shoulder elevation and trunk lean as compensation to improve true glenohumeral joint movement;   Finished with cryotherapy to left shoulder in sitting x5 min (unbilled);                        PT  Education - 12/20/15 0820    Education provided Yes   Education Details Re-educated patient in HEP with cues for better wand exercise for better shoulder ROM;    Person(s) Educated Patient   Methods Explanation;Verbal cues   Comprehension Verbalized understanding;Returned demonstration;Verbal cues required             PT Long Term Goals - 12/07/15 1728    PT LONG TERM GOAL #1   Title Patient will be independent in home exercise program to improve strength/mobility for better functional independence with ADLs. by 02/01/16   Time 8   Period Weeks   Status On-going   PT LONG TERM GOAL #2   Title Patient will decrease Quick DASH score by > 8 points demonstrating reduced self-reported upper extremity disability. by 02/01/16   Time 8   Period Weeks   Status On-going   PT LONG TERM GOAL #3   Title Patient will increase LUE shoulder AROM: sitting: Shoulder flexion >120 degrees, abduction: >120 degrees, supine: ER: >50 degrees, IR: >50 degrees for increased functional ROM with ADLs such as grooming, dressing etc; 02/01/16   Time 8   Period Weeks   Status On-going   PT LONG TERM GOAL #4   Title Patient will increase LUE shoulder strength to >4/5 for increased functional strength with lifting groceries, doing household chores etc. by 02/01/16   Time 8   Period Weeks   Status Not Met   PT LONG TERM GOAL #5   Title Patient will report a worst pain of 3/10 on VAS in    LUE         to improve tolerance with ADLs and reduced symptoms with activities. by 02/01/16   Time 8   Period Weeks   Status On-going               Plan - 12/20/15 0901    Clinical Impression Statement Patient reports increased pain today; She demonstrates heavy guarding and had difficulty with manual therapy; Patient complained of increased discomfort in left upper trap; PT explained that this could be related to overcompensating with shoulder elevation. Instructed patient in shoulder stretches to reduce  discomfort. She would benefit from additional skilled PT intervention to reduce shoulder pain and improve mobility;    Rehab Potential Poor   Clinical Impairments Affecting Rehab Potential positive: motivated, negative: co-morbidities, fear avoidance; Patient's clinical presentation is evolving as her pain is severe and she demonstrates significant fear avoidance limiting PROM;    PT Frequency 2x / week   PT Duration 8 weeks   PT Treatment/Interventions ADLs/Self Care Home Management;Cryotherapy;Electrical Stimulation;Moist Heat;Therapeutic exercise;Therapeutic activities;Functional mobility training;Ultrasound;Patient/family  education;Manual techniques;Taping;Energy conservation;Dry needling   PT Next Visit Plan PROM   PT Home Exercise Plan continue as given;    Consulted and Agree with Plan of Care Patient      Patient will benefit from skilled therapeutic intervention in order to improve the following deficits and impairments:  Decreased endurance, Obesity, Decreased knowledge of precautions, Decreased activity tolerance, Decreased strength, Impaired UE functional use, Pain, Increased muscle spasms, Decreased mobility, Decreased range of motion, Improper body mechanics, Postural dysfunction, Impaired flexibility  Visit Diagnosis: Left shoulder pain  Stiffness of left shoulder, not elsewhere classified  Muscle weakness (generalized)     Problem List Patient Active Problem List   Diagnosis Date Noted  . Pulmonary hypertension ([place]) 08/16/2012  . POTS (postural orthostatic tachycardia syndrome) 08/16/2012  . Chest pain 06/26/2012  . Shortness of breath 06/26/2012  . Rapid heart beat 06/26/2012    Ronae Noell PT, DPT 12/20/2015, 9:06 AM  [place] MAIN [facility] SERVICES [address] [place], [place], [address] Phone: [phone]   Fax:  [phone]  Name: Claudia [name] MRN: [id number] Date of Birth: April 05, 1966

## 2015-12-25 ENCOUNTER — Ambulatory Visit: Payer: 59 | Attending: Orthopedic Surgery | Admitting: Physical Therapy

## 2015-12-25 ENCOUNTER — Encounter: Payer: Self-pay | Admitting: Physical Therapy

## 2015-12-25 DIAGNOSIS — M25512 Pain in left shoulder: Secondary | ICD-10-CM | POA: Diagnosis not present

## 2015-12-25 DIAGNOSIS — M25612 Stiffness of left shoulder, not elsewhere classified: Secondary | ICD-10-CM | POA: Diagnosis not present

## 2015-12-25 DIAGNOSIS — M6281 Muscle weakness (generalized): Secondary | ICD-10-CM | POA: Diagnosis not present

## 2015-12-25 NOTE — Patient Instructions (Addendum)
Copyright  VHI. All rights reserved.  EXTERNAL ROTATION: Side-Lying (Active)    Lie on right side, top arm bent to 90, elbow against side, hand forward. Rotate forearm up as high as possible. Use _0__ lbs. Complete _2__ sets of 10___ repetitions. Perform 2___ sessions per day.  Copyright  VHI. All rights reserved.  FLEXION: Side-Lying - Elbow Flexed (Active)    Lie on right side, top arm bent. Raise arm up above head, keeping elbow bent. Use _0__ lbs. Perform __2_ sets of __10_ repetitions. Perform __2_ sessions per day.  Copyright  VHI. All rights reserved.  Extension - Prone (Dumbbell)    Lie with right arm hanging off side of bed. Lift hand back and up. Repeat _10___ times per set. Do __2__ sets per session. Do __2__ sessions per week. Use ___0_ lb weight.   Copyright  VHI. All rights reserved.  Prone Dumbbell Row    Lie prone on bed, left arm hanging off side of bed. Lift _0__ lb dumbbells, pulling shoulder blades together. Do _2__ sets of _10__ repetitions. Advanced: Do alternating arms.  Copyright  VHI. All rights reserved.  Roll    Inhale and bring shoulders up, back, then exhale and relax shoulders down. Repeat _15__ times. Do 2-3___ times per day.  Copyright  VHI. All rights reserved.

## 2015-12-25 NOTE — Therapy (Signed)
[place] MAIN [facility] SERVICES [address] [place], [place], [address] Phone: [phone]   Fax:  [phone]  Physical Therapy Treatment  Patient Details  Name: Claudia [name] MRN: [id number] Date of Birth: 1966-04-22 Referring Provider: Thornton Park MD  Encounter Date: 12/25/2015      PT End of Session - 12/25/15 0843    Visit Number 8   Number of Visits 20   Date for PT Re-Evaluation 02/01/16   PT Start Time 0805   PT Stop Time 0845   PT Time Calculation (min) 40 min   Activity Tolerance Patient limited by pain   Behavior During Therapy [facility] for tasks assessed/performed      Past Medical History  Diagnosis Date  . Asthma     H/O YEARS AGO-NO INHALERS  . Dysrhythmia     H/O [facility] PROBLEMS SINCE 2013  . Rotator cuff tear     left  . Difficult intubation   . Arthritis     knees, back;     Past Surgical History  Procedure Laterality Date  . Carpal tunnel release      x2  . Tonsillectomy    . Colonoscopy    . Cholecystectomy    . Shoulder arthroscopy with open rotator cuff repair Left 10/31/2015    Procedure: SHOULDER ARTHROSCOPY, SUBACROMIAL DECOMPRESSION, BICEPS TENOTOMY AND MINI OPEN ROTATOR CUFF REPAIR;  Surgeon: Thornton Park, MD;  Location: [facility] ORS;  Service: Orthopedics;  Laterality: Left;    There were no vitals filed for this visit.      Subjective Assessment - 12/25/15 0813    Subjective Patient reports doing okay this morning. She reports compliance with HEP but is stil having a lot of pain in left shoulder;    Pertinent History personal factors affecting rehab: chronicity of left shoulder pain, fear avoidance, anxiety over injury; history of arthritis;    How long can you sit comfortably? 5-49mn;    How long can you stand comfortably? NA   How long can you walk comfortably? NA   Diagnostic tests had MRI which showed full thickness tear prior to surgery;    Patient Stated Goals Get back to  moving LUE; relieve some of pain;    Currently in Pain? Yes   Pain Score 4    Pain Location Shoulder   Pain Orientation Left;Posterior   Pain Descriptors / Indicators Sore   Pain Type Surgical pain   Pain Onset 1 to 4 weeks ago      TREATMENT: Patient supine: Wand chest press AAROM x10 reps; with min VCs to increase elbow extension as pushing up;  Wand shoulder flexion AAROM x10 reps with cues to avoid painful ROM;  LUE scapular protraction 2x10 with min VCs to avoid elbow flexion;  Patient in right sidelying: LUE shoulder ER AROM x10 with min VCs to keep elbow against side; LUE shoulder flexion AROM x10 with elbow bent and min VCs to increase ROM as patient in gravity reduced position;  Patient prone: LUE shoulder extension 2x10 AROM: LUE Low row 2x10 AROM with min VCs to increase scapular retraction;    PT performed manual therapy: Patient supine: Isometric LUE shoulder flexion, abduction, IR/ER 3 sec hold x10each, with minimal manual resistance;  Seated: BUE posterior shoulder rolls x10 with min VCs to increase shoulder ROM for better technique; LUE UE ranger in sitting, flexion, horizontal abduction/adduction, circles x3-4 min AAROM;  Patient required min-moderate verbal/tactile cues for correct exercise technique especially to avoid  shoulder elevation and trunk lean as compensation to improve true glenohumeral joint movement;   Finished with cryotherapy to left shoulder in sitting x5 min (unbilled);                            PT Education - 12/25/15 0839    Education Details HEP advanced, see patient instructions;    Person(s) Educated Patient   Methods Explanation;Verbal cues;Handout   Comprehension Verbalized understanding;Returned demonstration;Verbal cues required             PT Long Term Goals - 12/07/15 1728    PT LONG TERM GOAL #1   Title Patient will be independent in home exercise program to improve strength/mobility for  better functional independence with ADLs. by 02/01/16   Time 8   Period Weeks   Status On-going   PT LONG TERM GOAL #2   Title Patient will decrease Quick DASH score by > 8 points demonstrating reduced self-reported upper extremity disability. by 02/01/16   Time 8   Period Weeks   Status On-going   PT LONG TERM GOAL #3   Title Patient will increase LUE shoulder AROM: sitting: Shoulder flexion >120 degrees, abduction: >120 degrees, supine: ER: >50 degrees, IR: >50 degrees for increased functional ROM with ADLs such as grooming, dressing etc; 02/01/16   Time 8   Period Weeks   Status On-going   PT LONG TERM GOAL #4   Title Patient will increase LUE shoulder strength to >4/5 for increased functional strength with lifting groceries, doing household chores etc. by 02/01/16   Time 8   Period Weeks   Status Not Met   PT LONG TERM GOAL #5   Title Patient will report a worst pain of 3/10 on VAS in    LUE         to improve tolerance with ADLs and reduced symptoms with activities. by 02/01/16   Time 8   Period Weeks   Status On-going               Plan - 12/25/15 6546    Clinical Impression Statement Patient instructed in advanced shoulder ROM exercise, instroducing AROM in sidelying and prone. Patient required min VCS for correct exercise technique. Advanced HEP. Patient demonstrates less guarding and able to demonstrate better ROM with AROM as compared to AAROM/PROM. She would benefit from additional skilled PT Intervention to improve shoulder ROM and reduce pain with ADLs.    Rehab Potential Poor   Clinical Impairments Affecting Rehab Potential positive: motivated, negative: co-morbidities, fear avoidance; Patient's clinical presentation is evolving as her pain is severe and she demonstrates significant fear avoidance limiting PROM;    PT Frequency 2x / week   PT Duration 8 weeks   PT Treatment/Interventions ADLs/Self Care Home Management;Cryotherapy;Electrical Stimulation;Moist  Heat;Therapeutic exercise;Therapeutic activities;Functional mobility training;Ultrasound;Patient/family education;Manual techniques;Taping;Energy conservation;Dry needling   PT Next Visit Plan AROM, scapular strengthening, PROM, isometric   PT Home Exercise Plan advanced, see patient instructions;    Consulted and Agree with Plan of Care Patient      Patient will benefit from skilled therapeutic intervention in order to improve the following deficits and impairments:  Decreased endurance, Obesity, Decreased knowledge of precautions, Decreased activity tolerance, Decreased strength, Impaired UE functional use, Pain, Increased muscle spasms, Decreased mobility, Decreased range of motion, Improper body mechanics, Postural dysfunction, Impaired flexibility  Visit Diagnosis: Left shoulder pain  Stiffness of left shoulder, not elsewhere classified  Muscle weakness (generalized)  Problem List Patient Active Problem List   Diagnosis Date Noted  . Pulmonary hypertension ([place]) 08/16/2012  . POTS (postural orthostatic tachycardia syndrome) 08/16/2012  . Chest pain 06/26/2012  . Shortness of breath 06/26/2012  . Rapid heart beat 06/26/2012    Dalinda Heidt PT, DPT 12/25/2015, 8:45 AM  [place] MAIN [facility] SERVICES [address] [place], [place], [address] Phone: [phone]   Fax:  [phone]  Name: Claudia [name] MRN: [id number] Date of Birth: 07-Sep-1965

## 2015-12-27 ENCOUNTER — Encounter: Payer: Self-pay | Admitting: Physical Therapy

## 2015-12-27 ENCOUNTER — Ambulatory Visit: Payer: 59 | Admitting: Physical Therapy

## 2015-12-27 DIAGNOSIS — M25512 Pain in left shoulder: Secondary | ICD-10-CM

## 2015-12-27 DIAGNOSIS — M25612 Stiffness of left shoulder, not elsewhere classified: Secondary | ICD-10-CM

## 2015-12-27 DIAGNOSIS — M6281 Muscle weakness (generalized): Secondary | ICD-10-CM

## 2015-12-27 NOTE — Therapy (Signed)
[place] [facility] MAIN REHAB SERVICES [address] , [place], [address] Phone: [phone]   Fax:  [phone]  Physical Therapy Treatment/Progress Note 11/17/15-12/27/15  Patient Details  Name: Claudia [name] MRN: [id number] Date of Birth: 12/04/1965 Referring Provider: Kevin Krasinski MD  Encounter Date: 12/27/2015      PT End of Session - 12/27/15 0822    Visit Number 9   Number of Visits 20   Date for PT Re-Evaluation 02/01/16   PT Start Time 0804   PT Stop Time 0845   PT Time Calculation (min) 41 min   Activity Tolerance Patient limited by pain   Behavior During Therapy [facility] for tasks assessed/performed      Past Medical History  Diagnosis Date  . Asthma     H/O YEARS AGO-NO INHALERS  . Dysrhythmia     H/O TACHYCARDIA-NO PROBLEMS SINCE 2013  . Rotator cuff tear     left  . Difficult intubation   . Arthritis     knees, back;     Past Surgical History  Procedure Laterality Date  . Carpal tunnel release      x2  . Tonsillectomy    . Colonoscopy    . Cholecystectomy    . Shoulder arthroscopy with open rotator cuff repair Left 10/31/2015    Procedure: SHOULDER ARTHROSCOPY, SUBACROMIAL DECOMPRESSION, BICEPS TENOTOMY AND MINI OPEN ROTATOR CUFF REPAIR;  Surgeon: Kevin Krasinski, MD;  Location: [facility] ORS;  Service: Orthopedics;  Laterality: Left;    There were no vitals filed for this visit.      Subjective Assessment - 12/27/15 0813    Subjective Patient reports doing well with new exercise. She reports no significant changes; She continues to have increased left shoulder pain especially when lying on back at night; She also reports increased heaviness and pull with UE movement;    Pertinent History personal factors affecting rehab: chronicity of left shoulder pain, fear avoidance, anxiety over injury; history of arthritis;    How long can you sit comfortably? 5-10min;    How long can you stand comfortably? NA   How long can you  walk comfortably? NA   Diagnostic tests had MRI which showed full thickness tear prior to surgery;    Patient Stated Goals Get back to moving LUE; relieve some of pain;    Currently in Pain? Yes   Pain Score 4    Pain Location Shoulder   Pain Orientation Left;Posterior   Pain Descriptors / Indicators Sore   Pain Type Surgical pain   Pain Onset 1 to 4 weeks ago            [facility] PT Assessment - 12/27/15 0001    Observation/Other Assessments   Quick DASH  88.6% (the greater the score the greater the disability; improved from eval on 3/24 which was 100%)   PROM   Left Shoulder Flexion 120 Degrees  AAROM; patient exhibits heavy guarding with PROM   Left Shoulder ABduction 90 Degrees  with increased pain; improved from eval on 3/24 which was 50   Left Shoulder Internal Rotation 35 Degrees  supine with 60 degrees abduction   Left Shoulder External Rotation 20 Degrees  supine with 60 degrees abduction       TREATMENT: Patient supine: Wand chest press AAROM x15 reps; with min VCs to increase elbow extension as pushing up;  Wand shoulder flexion AAROM x10 reps with cues to avoid painful ROM;   Patient in right sidelying: LUE shoulder   ER AROM 2x10 with min VCs to keep elbow against side; LUE shoulder flexion AROM 2x10 with elbow bent and min VCs to increase ROM as patient in gravity reduced position;  Patient prone: LUE shoulder extension 1# 2x10 LUE Low row 1# 2x10 AROM with min VCs to increase scapular retraction;  LUE horizontal abduction  x10 with min VCS  PT performed manual therapy: Patient supine: Isometric LUE shoulder flexion, abduction, IR/ER 3 sec hold x10each, with minimal manual resistance; PT assessed LUE shoulder ROM, see above;  Patient required min-moderate verbal/tactile cues for correct exercise technique especially to avoid shoulder elevation and trunk lean as compensation to improve true glenohumeral joint movement;   Finished with cryotherapy to left  shoulder in sitting x5 min (unbilled);                              PT Education - 12/27/15 0822    Education provided Yes   Education Details HEP reinforced, exercise technique; shoulder ROM;    Person(s) Educated Patient   Methods Explanation;Verbal cues   Comprehension Verbalized understanding;Returned demonstration;Verbal cues required             PT Long Term Goals - 12/27/15 0823    PT LONG TERM GOAL #1   Title Patient will be independent in home exercise program to improve strength/mobility for better functional independence with ADLs. by 02/01/16   Time 8   Period Weeks   Status On-going   PT LONG TERM GOAL #2   Title Patient will decrease Quick DASH score by > 8 points demonstrating reduced self-reported upper extremity disability. by 02/01/16   Time 8   Period Weeks   Status Partially Met   PT LONG TERM GOAL #3   Title Patient will increase LUE shoulder AROM: sitting: Shoulder flexion >120 degrees, abduction: >120 degrees, supine: ER: >50 degrees, IR: >50 degrees for increased functional ROM with ADLs such as grooming, dressing etc; 02/01/16   Time 8   Period Weeks   Status Deferred   PT LONG TERM GOAL #4   Title Patient will increase LUE shoulder strength to >4/5 for increased functional strength with lifting groceries, doing household chores etc. by 02/01/16   Time 8   Period Weeks   Status Partially Met   PT LONG TERM GOAL #5   Title Patient will report a worst pain of 3/10 on VAS in    LUE         to improve tolerance with ADLs and reduced symptoms with activities. by 02/01/16   Baseline 10/10   Time 8   Period Weeks   Status Not Met               Plan - 12/27/15 0854    Clinical Impression Statement Patient continues to have heavy guarding during PROM with increased pain in shoulder flexion, abduction and IR/ER; she was able to demonstrate improved ROM with AAROM wand exercise. Patient requires mod VCs with all exercise to  avoid compensation with elbow movement/shoulder elevation and isolate true glenohumeral joint movement. Her progress continues to be slow. She would benefit from additional skilled PT intervention to improve shoulder ROM, reduce pain and return to PLOF.    Rehab Potential Poor   Clinical Impairments Affecting Rehab Potential positive: motivated, negative: co-morbidities, fear avoidance; Patient's clinical presentation is evolving as her pain is severe and she demonstrates significant fear avoidance limiting PROM;    PT Frequency   2x / week   PT Duration 8 weeks   PT Treatment/Interventions ADLs/Self Care Home Management;Cryotherapy;Electrical Stimulation;Moist Heat;Therapeutic exercise;Therapeutic activities;Functional mobility training;Ultrasound;Patient/family education;Manual techniques;Taping;Energy conservation;Dry needling   PT Next Visit Plan AROM, scapular strengthening, PROM, isometric   PT Home Exercise Plan continue as given;    Consulted and Agree with Plan of Care Patient      Patient will benefit from skilled therapeutic intervention in order to improve the following deficits and impairments:  Decreased endurance, Obesity, Decreased knowledge of precautions, Decreased activity tolerance, Decreased strength, Impaired UE functional use, Pain, Increased muscle spasms, Decreased mobility, Decreased range of motion, Improper body mechanics, Postural dysfunction, Impaired flexibility  Visit Diagnosis: Left shoulder pain  Stiffness of left shoulder, not elsewhere classified  Muscle weakness (generalized)     Problem List Patient Active Problem List   Diagnosis Date Noted  . Pulmonary hypertension ([place]) 08/16/2012  . POTS (postural orthostatic tachycardia syndrome) 08/16/2012  . Chest pain 06/26/2012  . Shortness of breath 06/26/2012  . Rapid heart beat 06/26/2012    Khiem Gargis PT, DPT 12/27/2015, 8:56 AM  [place] MAIN [facility]  SERVICES [address] [place], [place], [address] Phone: [phone]   Fax:  [phone]  Name: Claudia [name] MRN: [id number] Date of Birth: 02/22/1966

## 2016-01-01 ENCOUNTER — Encounter: Payer: Self-pay | Admitting: Physical Therapy

## 2016-01-01 ENCOUNTER — Ambulatory Visit: Payer: 59 | Admitting: Physical Therapy

## 2016-01-01 DIAGNOSIS — M25612 Stiffness of left shoulder, not elsewhere classified: Secondary | ICD-10-CM | POA: Diagnosis not present

## 2016-01-01 DIAGNOSIS — M6281 Muscle weakness (generalized): Secondary | ICD-10-CM

## 2016-01-01 DIAGNOSIS — M25512 Pain in left shoulder: Secondary | ICD-10-CM

## 2016-01-01 NOTE — Therapy (Signed)
[place] MAIN [facility] SERVICES [address] [place], [place], [address] Phone: [phone]   Fax:  [phone]  Physical Therapy Treatment  Patient Details  Name: Claudia [name] MRN: [id number] Date of Birth: Mar 18, 1966 Referring Provider: Thornton Park MD  Encounter Date: 01/01/2016      PT End of Session - 01/01/16 0842    Visit Number 10   Number of Visits 20   Date for PT Re-Evaluation 02/01/16   PT Start Time 0803   PT Stop Time 0845   PT Time Calculation (min) 42 min   Activity Tolerance Patient limited by pain   Behavior During Therapy [facility] for tasks assessed/performed      Past Medical History  Diagnosis Date  . Asthma     H/O YEARS AGO-NO INHALERS  . Dysrhythmia     H/O [facility] PROBLEMS SINCE 2013  . Rotator cuff tear     left  . Difficult intubation   . Arthritis     knees, back;     Past Surgical History  Procedure Laterality Date  . Carpal tunnel release      x2  . Tonsillectomy    . Colonoscopy    . Cholecystectomy    . Shoulder arthroscopy with open rotator cuff repair Left 10/31/2015    Procedure: SHOULDER ARTHROSCOPY, SUBACROMIAL DECOMPRESSION, BICEPS TENOTOMY AND MINI OPEN ROTATOR CUFF REPAIR;  Surgeon: Thornton Park, MD;  Location: [facility] ORS;  Service: Orthopedics;  Laterality: Left;    There were no vitals filed for this visit.      Subjective Assessment - 01/01/16 0814    Subjective Patient reports getitng her MD appointment rescheduled; She reports doing well over the weekend; no new changes; She continues to have shoulder pain with excessive movement of LUE;    Pertinent History personal factors affecting rehab: chronicity of left shoulder pain, fear avoidance, anxiety over injury; history of arthritis;    How long can you sit comfortably? 5-38mn;    How long can you stand comfortably? NA   How long can you walk comfortably? NA   Diagnostic tests had MRI which showed full thickness tear  prior to surgery;    Patient Stated Goals Get back to moving LUE; relieve some of pain;    Currently in Pain? Yes   Pain Score 5    Pain Location Shoulder   Pain Orientation Left   Pain Descriptors / Indicators Sore   Pain Type Surgical pain   Pain Onset 1 to 4 weeks ago      TREATMENT: PT instructed patient in pball shoulder flexion AAROM on table and wall however she was unable to perform due to increased shoulder pain and difficulty performing task; LUE AAROM wall slides x10 reps with mod tactile and verbal cues to avoid shoulder elevation; LUE AAROM shoulder flexion finger walk x5 reps LUE AAROM shoulder abduction x10 reps with cues for correct positioning to increase shoulder ROM;  LUE AAROM shoulder IR with strap x 5 reps;  Patient supine: Wand chest press AAROM x15 reps; with min VCs to increase elbow extension as pushing up;  Wand shoulder flexion AAROM x10 reps with cues to increased AAROM;   Patient in right sidelying: LUE shoulder ER AROM 1# 2x10 with mod verbal and tactile cues for correct exercise technique; LUE shoulder flexion AROM 1#x10 with elbow bent and mod verbal and tactile cues to increase shoulder flexion ROM;  Patient prone: LUE shoulder extension 1# x15 LUE Low row 1#  x15 AROM with min VCs to increase scapular retraction;  LUE mid row 1# x10 with mod VCs and tactile cues for correct positioning;    Finished with cryotherapy to left shoulder in sitting x5 min (unbilled);                            PT Education - 01/01/16 0842    Education provided Yes   Education Details HEP advanced, shoulder ROM   Person(s) Educated Patient   Methods Explanation;Verbal cues   Comprehension Verbalized understanding;Returned demonstration;Verbal cues required             PT Long Term Goals - 12/27/15 4540    PT LONG TERM GOAL #1   Title Patient will be independent in home exercise program to improve strength/mobility for better  functional independence with ADLs. by 02/01/16   Time 8   Period Weeks   Status On-going   PT LONG TERM GOAL #2   Title Patient will decrease Quick DASH score by > 8 points demonstrating reduced self-reported upper extremity disability. by 02/01/16   Time 8   Period Weeks   Status Partially Met   PT LONG TERM GOAL #3   Title Patient will increase LUE shoulder AROM: sitting: Shoulder flexion >120 degrees, abduction: >120 degrees, supine: ER: >50 degrees, IR: >50 degrees for increased functional ROM with ADLs such as grooming, dressing etc; 02/01/16   Time 8   Period Weeks   Status Deferred   PT LONG TERM GOAL #4   Title Patient will increase LUE shoulder strength to >4/5 for increased functional strength with lifting groceries, doing household chores etc. by 02/01/16   Time 8   Period Weeks   Status Partially Met   PT LONG TERM GOAL #5   Title Patient will report a worst pain of 3/10 on VAS in    LUE         to improve tolerance with ADLs and reduced symptoms with activities. by 02/01/16   Baseline 10/10   Time 8   Period Weeks   Status Not Met               Plan - 01/01/16 9811    Clinical Impression Statement Patient continues to have increased LUE shoulder pain with AAROM. She is hesitant to push self into increased ROM, requiring mod verbal and tactile cues to increase ROM for better shoulder flexibility; She reports doing HEP at home but admits that she isn't pushing herself. Patient requires increased education on importance of increasing ROM to increase tolerance with ADLs; She would benefit from additional skilled PT intervention to improve shoulder ROM and reduce pain with ADLs.    Rehab Potential Poor   Clinical Impairments Affecting Rehab Potential positive: motivated, negative: co-morbidities, fear avoidance; Patient's clinical presentation is evolving as her pain is severe and she demonstrates significant fear avoidance limiting PROM;    PT Frequency 2x / week   PT  Duration 8 weeks   PT Treatment/Interventions ADLs/Self Care Home Management;Cryotherapy;Electrical Stimulation;Moist Heat;Therapeutic exercise;Therapeutic activities;Functional mobility training;Ultrasound;Patient/family education;Manual techniques;Taping;Energy conservation;Dry needling   PT Next Visit Plan AROM, scapular strengthening, PROM, isometric   PT Home Exercise Plan continue as given;    Consulted and Agree with Plan of Care Patient      Patient will benefit from skilled therapeutic intervention in order to improve the following deficits and impairments:  Decreased endurance, Obesity, Decreased knowledge of precautions, Decreased activity tolerance, Decreased  strength, Impaired UE functional use, Pain, Increased muscle spasms, Decreased mobility, Decreased range of motion, Improper body mechanics, Postural dysfunction, Impaired flexibility  Visit Diagnosis: Left shoulder pain  Stiffness of left shoulder, not elsewhere classified  Muscle weakness (generalized)     Problem List Patient Active Problem List   Diagnosis Date Noted  . Pulmonary hypertension ([place]) 08/16/2012  . POTS (postural orthostatic tachycardia syndrome) 08/16/2012  . Chest pain 06/26/2012  . Shortness of breath 06/26/2012  . Rapid heart beat 06/26/2012    Willadean Guyton PT, DPT 01/01/2016, 8:44 AM  [place] MAIN [facility] SERVICES [address] [place], [place], [address] Phone: [phone]   Fax:  [phone]  Name: Claudia [name] MRN: [id number] Date of Birth: August 11, 1966

## 2016-01-03 ENCOUNTER — Encounter: Payer: Self-pay | Admitting: Physical Therapy

## 2016-01-03 ENCOUNTER — Ambulatory Visit: Payer: 59 | Admitting: Physical Therapy

## 2016-01-03 DIAGNOSIS — M25612 Stiffness of left shoulder, not elsewhere classified: Secondary | ICD-10-CM | POA: Diagnosis not present

## 2016-01-03 DIAGNOSIS — Z1321 Encounter for screening for nutritional disorder: Secondary | ICD-10-CM | POA: Diagnosis not present

## 2016-01-03 DIAGNOSIS — Z13220 Encounter for screening for lipoid disorders: Secondary | ICD-10-CM | POA: Diagnosis not present

## 2016-01-03 DIAGNOSIS — M6281 Muscle weakness (generalized): Secondary | ICD-10-CM | POA: Diagnosis not present

## 2016-01-03 DIAGNOSIS — Z1329 Encounter for screening for other suspected endocrine disorder: Secondary | ICD-10-CM | POA: Diagnosis not present

## 2016-01-03 DIAGNOSIS — Z131 Encounter for screening for diabetes mellitus: Secondary | ICD-10-CM | POA: Diagnosis not present

## 2016-01-03 DIAGNOSIS — Z136 Encounter for screening for cardiovascular disorders: Secondary | ICD-10-CM | POA: Diagnosis not present

## 2016-01-03 DIAGNOSIS — M25512 Pain in left shoulder: Secondary | ICD-10-CM

## 2016-01-03 NOTE — Therapy (Signed)
[place] MAIN [facility] SERVICES [address] [place], [place], [address] Phone: [phone]   Fax:  [phone]  Physical Therapy Treatment  Patient Details  Name: Claudia [name] MRN: [id number] Date of Birth: Dec 19, 1965 Referring Provider: Thornton Park MD  Encounter Date: 01/03/2016      PT End of Session - 01/03/16 0843    Visit Number 11   Number of Visits 20   Date for PT Re-Evaluation 02/01/16   PT Start Time 0803   PT Stop Time 0845   PT Time Calculation (min) 42 min   Activity Tolerance Patient limited by pain   Behavior During Therapy [facility] for tasks assessed/performed      Past Medical History  Diagnosis Date  . Asthma     H/O YEARS AGO-NO INHALERS  . Dysrhythmia     H/O [facility] PROBLEMS SINCE 2013  . Rotator cuff tear     left  . Difficult intubation   . Arthritis     knees, back;     Past Surgical History  Procedure Laterality Date  . Carpal tunnel release      x2  . Tonsillectomy    . Colonoscopy    . Cholecystectomy    . Shoulder arthroscopy with open rotator cuff repair Left 10/31/2015    Procedure: SHOULDER ARTHROSCOPY, SUBACROMIAL DECOMPRESSION, BICEPS TENOTOMY AND MINI OPEN ROTATOR CUFF REPAIR;  Surgeon: Thornton Park, MD;  Location: [facility] ORS;  Service: Orthopedics;  Laterality: Left;    There were no vitals filed for this visit.      Subjective Assessment - 01/03/16 0809    Subjective Patient reports, "I have more pain when it rains. I feel like an old woman." Patient reports incresaed shoulder pain this AM due to weather. She reports still having trouble performing exercise at home, not being able to get full ROM;    Pertinent History personal factors affecting rehab: chronicity of left shoulder pain, fear avoidance, anxiety over injury; history of arthritis;    How long can you sit comfortably? 5-80mn;    How long can you stand comfortably? NA   How long can you walk comfortably? NA   Diagnostic tests had MRI which showed full thickness tear prior to surgery;    Patient Stated Goals Get back to moving LUE; relieve some of pain;    Currently in Pain? Yes   Pain Score 4    Pain Location Shoulder   Pain Orientation Left   Pain Descriptors / Indicators Aching;Sore   Pain Type Surgical pain   Pain Onset 1 to 4 weeks ago        TREATMENT: Warm up on UBE, BUE backwards only level 0 x3 min (Unbilled);  Standing with wand: BUE chest press x10  BUE shoulder flexion x10 BUE IR with wand behind back x10 LUE shoulder abduction x10 BUE overhead press x10 Patient required mod VCs for correct exercise technique including positioning of wand and to increase ROM for better flexibility;  Standing: Yellow tband in BUE: BUE rows 2x10 BUE shoulder extension 2x10  With mod VCs to increase scapular retraction and avoid shoulder elevation;  LUE shoulder ER yellow tband 2x10 with mod verbal and tactile cues for correct technique  Standing: Wall slides into shoulder flexion LUE x10;   Patient in right sidelying: LUE shoulder ER AROM 1# 2x15 with mod verbal and tactile cues for correct exercise technique; LUE shoulder flexion AROM 1# 2x15 with elbow bent and mod verbal and tactile cues  to increase shoulder flexion ROM;   Finished with cryotherapy to left shoulder in sitting x5 min (unbilled);                           PT Education - 01/03/16 0842    Education provided Yes   Education Details HEP advanced, ROM   Person(s) Educated Patient   Methods Explanation;Verbal cues   Comprehension Verbalized understanding;Returned demonstration;Verbal cues required             PT Long Term Goals - 12/27/15 0254    PT LONG TERM GOAL #1   Title Patient will be independent in home exercise program to improve strength/mobility for better functional independence with ADLs. by 02/01/16   Time 8   Period Weeks   Status On-going   PT LONG TERM GOAL #2    Title Patient will decrease Quick DASH score by > 8 points demonstrating reduced self-reported upper extremity disability. by 02/01/16   Time 8   Period Weeks   Status Partially Met   PT LONG TERM GOAL #3   Title Patient will increase LUE shoulder AROM: sitting: Shoulder flexion >120 degrees, abduction: >120 degrees, supine: ER: >50 degrees, IR: >50 degrees for increased functional ROM with ADLs such as grooming, dressing etc; 02/01/16   Time 8   Period Weeks   Status Deferred   PT LONG TERM GOAL #4   Title Patient will increase LUE shoulder strength to >4/5 for increased functional strength with lifting groceries, doing household chores etc. by 02/01/16   Time 8   Period Weeks   Status Partially Met   PT LONG TERM GOAL #5   Title Patient will report a worst pain of 3/10 on VAS in    LUE         to improve tolerance with ADLs and reduced symptoms with activities. by 02/01/16   Baseline 10/10   Time 8   Period Weeks   Status Not Met               Plan - 01/03/16 0843    Clinical Impression Statement Instructed patient in advanced exercise in standing. She seemed to tolerate standing exercise well with less shoulder discomfort. She continues to require verbal and visual cues for better ROM as she tends to bend elbow thinking that she is doing shoulder flexion. Patient would benefit from additional skilled PT Intervention to improve shoulder ROM, strength and reduce pain with ADLs.    Rehab Potential Poor   Clinical Impairments Affecting Rehab Potential positive: motivated, negative: co-morbidities, fear avoidance; Patient's clinical presentation is evolving as her pain is severe and she demonstrates significant fear avoidance limiting PROM;    PT Frequency 2x / week   PT Duration 8 weeks   PT Treatment/Interventions ADLs/Self Care Home Management;Cryotherapy;Electrical Stimulation;Moist Heat;Therapeutic exercise;Therapeutic activities;Functional mobility  training;Ultrasound;Patient/family education;Manual techniques;Taping;Energy conservation;Dry needling   PT Next Visit Plan AROM, scapular strengthening, PROM, isometric   PT Home Exercise Plan continue as given;    Consulted and Agree with Plan of Care Patient      Patient will benefit from skilled therapeutic intervention in order to improve the following deficits and impairments:  Decreased endurance, Obesity, Decreased knowledge of precautions, Decreased activity tolerance, Decreased strength, Impaired UE functional use, Pain, Increased muscle spasms, Decreased mobility, Decreased range of motion, Improper body mechanics, Postural dysfunction, Impaired flexibility  Visit Diagnosis: Left shoulder pain  Stiffness of left shoulder, not elsewhere classified  Muscle  weakness (generalized)     Problem List Patient Active Problem List   Diagnosis Date Noted  . Pulmonary hypertension ([place]) 08/16/2012  . POTS (postural orthostatic tachycardia syndrome) 08/16/2012  . Chest pain 06/26/2012  . Shortness of breath 06/26/2012  . Rapid heart beat 06/26/2012    Jeany Seville PT, DPT 01/03/2016, 8:45 AM  [place] MAIN [facility] SERVICES [address] [place], [place], [address] Phone: [phone]   Fax:  [phone]  Name: ALVERNA FAWLEY MRN: [id number] Date of Birth: 13-Oct-1965

## 2016-01-03 NOTE — Patient Instructions (Addendum)
  Copyright  VHI. All rights reserved.  Shoulder Retraction   Tie band around door knob (sitting or standing, holding band in both hands) Facing chest height anchor, grasp ends of band and pull hands to chest, squeezing shoulder blades together. Hold _3-5seconds. Repeat _10 times. Do _2_ sessions per day. Safety Note: Be sure anchor is secure.  Copyright  VHI. All rights reserved.  Strengthening: Resisted Extension   Hold band in both hands, Pull arm back, elbow straight, squeezing shoulder blades, Repeat _10___ times per set. Do _2___ sets per session. Do __1__ sessions per day.  http://orth.exer.us/833   Copyright  VHI. All rights reserved.     Strengthening: Resisted External Rotation    Hold tubing in right hand, elbow at side and forearm across body. Rotate forearm out. Repeat __10__ times per set. Do _2___ sets per session. Do _2___ sessions per day.  http://orth.exer.us/829   Copyright  VHI. All rights reserved.   Extension - Prone (Dumbbell)    Lie with left arm hanging off side of bed. Lift hand back and up. Repeat _10___ times per set. Do _2___ sets per session. Do __5__ sessions per week. Use __1__ lb weight (use soup can or water bottle).   Copyright  VHI. All rights reserved.  Prone Dumbbell Row    Lie on stomach with left arm hanging off edge of bed, body straight, . Lift _1__ lb dumbbells, pulling shoulder blades together. Do __2_ sets of __10_ repetitions. Advanced: Do alternating arms.  Copyright  VHI. All rights reserved.  Abduction: Horizontal - Prone (Dumbbell)    Lie with left arm hanging down. Lift arm out to side, thumb up. Repeat _10___ times per set. Do _2___ sets per session. Do __5__ sessions per week. Use _1___ lb weight.   Copyright  VHI. All rights reserved.

## 2016-01-08 ENCOUNTER — Ambulatory Visit: Payer: 59 | Admitting: Physical Therapy

## 2016-01-08 ENCOUNTER — Encounter: Payer: Self-pay | Admitting: Physical Therapy

## 2016-01-08 DIAGNOSIS — M6281 Muscle weakness (generalized): Secondary | ICD-10-CM

## 2016-01-08 DIAGNOSIS — M25512 Pain in left shoulder: Secondary | ICD-10-CM

## 2016-01-08 DIAGNOSIS — M25612 Stiffness of left shoulder, not elsewhere classified: Secondary | ICD-10-CM | POA: Diagnosis not present

## 2016-01-08 NOTE — Patient Instructions (Addendum)
      Flexion (Eccentric) - Active-Assist (Pulley)    With hands lightly holding pulley, raise affected arm with other arm (or push outward during lift). Avoid hiking shoulder. Then slowly lower affected arm for 3-5 seconds. Repeat for 2-3 minutes, 2x per day, _7__ days per week.  http://ecce.exer.us/151   Copyright  VHI. All rights reserved.  Abduction (Eccentric) - Active-Assist (Pulley)    With hands lightly holding pulley, raise affected arm out to side with other arm. Avoid hiking shoulder. Then slowly lower affected arm for 3-5 seconds. 2-3 minutes , _2__ sets per day, __7_ days per week.  http://ecce.exer.us/161    Copyright  VHI. All rights reserved.  Press-Up With Wand    Press wand up until elbows are straight, then reach wand over head to a pain free range. (Try to touch wall or headboard) Hold __2__ seconds. Repeat _10___ times. Do __2__ sessions per day.  Copyright  VHI. All rights reserved.

## 2016-01-08 NOTE — Therapy (Signed)
[place] MAIN [facility] SERVICES [address] [place], [place], [address] Phone: [phone]   Fax:  [phone]  Physical Therapy Treatment  Patient Details  Name: Claudia [name] MRN: [id number] Date of Birth: 1966-07-04 Referring Provider: Thornton Park MD  Encounter Date: 01/08/2016      PT End of Session - 01/08/16 0844    Visit Number 12   Number of Visits 20   Date for PT Re-Evaluation 02/01/16   PT Start Time 0803   PT Stop Time 0845   PT Time Calculation (min) 42 min   Activity Tolerance Patient limited by pain   Behavior During Therapy [facility] for tasks assessed/performed      Past Medical History  Diagnosis Date  . Asthma     H/O YEARS AGO-NO INHALERS  . Dysrhythmia     H/O [facility] PROBLEMS SINCE 2013  . Rotator cuff tear     left  . Difficult intubation   . Arthritis     knees, back;     Past Surgical History  Procedure Laterality Date  . Carpal tunnel release      x2  . Tonsillectomy    . Colonoscopy    . Cholecystectomy    . Shoulder arthroscopy with open rotator cuff repair Left 10/31/2015    Procedure: SHOULDER ARTHROSCOPY, SUBACROMIAL DECOMPRESSION, BICEPS TENOTOMY AND MINI OPEN ROTATOR CUFF REPAIR;  Surgeon: Thornton Park, MD;  Location: [facility] ORS;  Service: Orthopedics;  Laterality: Left;    There were no vitals filed for this visit.      Subjective Assessment - 01/08/16 0812    Subjective Patient reports going to see MD who send in a new order which states, "Do pulley, PROM and stick exercises" Patient is already doing wand exercises. She reports trying to do more but is still limited with pain.    Pertinent History personal factors affecting rehab: chronicity of left shoulder pain, fear avoidance, anxiety over injury; history of arthritis;    How long can you sit comfortably? 5-76mn;    How long can you stand comfortably? NA   How long can you walk comfortably? NA   Diagnostic tests had MRI  which showed full thickness tear prior to surgery;    Patient Stated Goals Get back to moving LUE; relieve some of pain;    Currently in Pain? Yes   Pain Score 2    Pain Location Shoulder   Pain Orientation Left   Pain Descriptors / Indicators Aching;Sore   Pain Type Surgical pain   Pain Onset 1 to 4 weeks ago      TREATMENT: Warm up on UBE, BUE backwards only level 0 x3 min (Unbilled);  Standing with wand: BUE chest press x10  BUE shoulder flexion x10 BUE IR with wand behind back x10 LUE shoulder abduction x10 BUE overhead press x10 Patient required mod VCs for correct exercise technique including positioning of wand and to increase ROM for better flexibility; Also required tactile cues to increase ROM;   Standing: Pulley over door, flexion/scaption x2 min each with mod VCS to relax LUE and to increase shoulder ROM by keeping elbow straight for better shoulder flexibility; Patient continues to be guarded and would compensation with elbow flexion or with trunk lean requiring cues for better positioning; Prone: LUE shoulder extension 2# x12 LUE shoulder rows 2# x12 LUE mid row 2# x12 with min VCs for correct positioning for better scapular retraction;  Patient in right sidelying: LUE shoulder ER AROM  2# 2x10 with mod verbal and tactile cues for correct exercise technique; LUE shoulder flexion AROM 2# 2x10 with elbow bent and mod verbal and tactile cues to increase shoulder flexion ROM;  Patient supine: Chest press with shoulder flexion with tactile cues to increase ROM x15 reps;  Standing with UE ranger, LUE alphabet A-Z x1 reps with mod VCS to keep elbow straight for better shoulder ROM;  Finished with cryotherapy to left shoulder in sitting x5 min (unbilled);                              PT Education - 01/08/16 0844    Education provided Yes   Education Details HEP advanced, ROM   Person(s) Educated Patient   Methods Explanation;Verbal cues    Comprehension Verbalized understanding;Returned demonstration;Verbal cues required             PT Long Term Goals - 12/27/15 7253    PT LONG TERM GOAL #1   Title Patient will be independent in home exercise program to improve strength/mobility for better functional independence with ADLs. by 02/01/16   Time 8   Period Weeks   Status On-going   PT LONG TERM GOAL #2   Title Patient will decrease Quick DASH score by > 8 points demonstrating reduced self-reported upper extremity disability. by 02/01/16   Time 8   Period Weeks   Status Partially Met   PT LONG TERM GOAL #3   Title Patient will increase LUE shoulder AROM: sitting: Shoulder flexion >120 degrees, abduction: >120 degrees, supine: ER: >50 degrees, IR: >50 degrees for increased functional ROM with ADLs such as grooming, dressing etc; 02/01/16   Time 8   Period Weeks   Status Deferred   PT LONG TERM GOAL #4   Title Patient will increase LUE shoulder strength to >4/5 for increased functional strength with lifting groceries, doing household chores etc. by 02/01/16   Time 8   Period Weeks   Status Partially Met   PT LONG TERM GOAL #5   Title Patient will report a worst pain of 3/10 on VAS in    LUE         to improve tolerance with ADLs and reduced symptoms with activities. by 02/01/16   Baseline 10/10   Time 8   Period Weeks   Status Not Met               Plan - 01/08/16 0847    Clinical Impression Statement Patient instructed in advanced shoulder ROM/strengthening exercise. Advanced strengthening with increased weight 2#, with prone exercise for better postural strengthening. Patient continues to require min-mod VCS and tactile cues to increase ROM for better shoulder flexibility. Advanced HEP- see patient instructions. She would benefit from additional skilled PT Intervention to improve shoulder ROM and reduce pain with ADLs.    Rehab Potential Poor   Clinical Impairments Affecting Rehab Potential positive:  motivated, negative: co-morbidities, fear avoidance; Patient's clinical presentation is evolving as her pain is severe and she demonstrates significant fear avoidance limiting PROM;    PT Frequency 2x / week   PT Duration 8 weeks   PT Treatment/Interventions ADLs/Self Care Home Management;Cryotherapy;Electrical Stimulation;Moist Heat;Therapeutic exercise;Therapeutic activities;Functional mobility training;Ultrasound;Patient/family education;Manual techniques;Taping;Energy conservation;Dry needling   PT Next Visit Plan AROM, scapular strengthening, PROM, isometric   PT Home Exercise Plan advanced- see patient instructions;    Consulted and Agree with Plan of Care Patient      Patient  will benefit from skilled therapeutic intervention in order to improve the following deficits and impairments:  Decreased endurance, Obesity, Decreased knowledge of precautions, Decreased activity tolerance, Decreased strength, Impaired UE functional use, Pain, Increased muscle spasms, Decreased mobility, Decreased range of motion, Improper body mechanics, Postural dysfunction, Impaired flexibility  Visit Diagnosis: Left shoulder pain  Stiffness of left shoulder, not elsewhere classified  Muscle weakness (generalized)     Problem List Patient Active Problem List   Diagnosis Date Noted  . Pulmonary hypertension ([place]) 08/16/2012  . POTS (postural orthostatic tachycardia syndrome) 08/16/2012  . Chest pain 06/26/2012  . Shortness of breath 06/26/2012  . Rapid heart beat 06/26/2012    Trotter,Margaret PT, DPT 01/08/2016, 8:48 AM  [place] MAIN [facility] SERVICES [address] [place], [place], [address] Phone: [phone]   Fax:  [phone]  Name: DALANI METTE MRN: [id number] Date of Birth: 08/25/1966

## 2016-01-10 ENCOUNTER — Encounter: Payer: Self-pay | Admitting: Physical Therapy

## 2016-01-10 ENCOUNTER — Ambulatory Visit: Payer: 59 | Admitting: Physical Therapy

## 2016-01-10 DIAGNOSIS — M25512 Pain in left shoulder: Secondary | ICD-10-CM

## 2016-01-10 DIAGNOSIS — M6281 Muscle weakness (generalized): Secondary | ICD-10-CM | POA: Diagnosis not present

## 2016-01-10 DIAGNOSIS — M25612 Stiffness of left shoulder, not elsewhere classified: Secondary | ICD-10-CM

## 2016-01-10 NOTE — Therapy (Signed)
[place] MAIN [facility] SERVICES [address] [place], [place], [address] Phone: [phone]   Fax:  [phone]  Physical Therapy Treatment  Patient Details  Name: Claudia [name] MRN: [id number] Date of Birth: [birth year] Referring Provider: Thornton Park MD  Encounter Date: 01/10/2016      PT End of Session - 01/10/16 0844    Visit Number 13   Number of Visits 20   Date for PT Re-Evaluation 02/01/16   PT Start Time 0803   PT Stop Time 0845   PT Time Calculation (min) 42 min   Activity Tolerance Patient tolerated treatment well   Behavior During Therapy [facility] for tasks assessed/performed      Past Medical History  Diagnosis Date  . Asthma     H/O YEARS AGO-NO INHALERS  . Dysrhythmia     H/O [facility] PROBLEMS SINCE 2013  . Rotator cuff tear     left  . Difficult intubation   . Arthritis     knees, back;     Past Surgical History  Procedure Laterality Date  . Carpal tunnel release      x2  . Tonsillectomy    . Colonoscopy    . Cholecystectomy    . Shoulder arthroscopy with open rotator cuff repair Left 10/31/2015    Procedure: SHOULDER ARTHROSCOPY, SUBACROMIAL DECOMPRESSION, BICEPS TENOTOMY AND MINI OPEN ROTATOR CUFF REPAIR;  Surgeon: Thornton Park, MD;  Location: [facility] ORS;  Service: Orthopedics;  Laterality: Left;    There were no vitals filed for this visit.      Subjective Assessment - 01/10/16 0809    Subjective Patient reports doing okay this morning. She reports being able to put her earrings in this morning which is new. Still has some soreness in left shoulder;    Pertinent History personal factors affecting rehab: chronicity of left shoulder pain, fear avoidance, anxiety over injury; history of arthritis;    How long can you sit comfortably? 5-82mn;    How long can you stand comfortably? NA   How long can you walk comfortably? NA   Diagnostic tests had MRI which showed full thickness tear prior to surgery;     Patient Stated Goals Get back to moving LUE; relieve some of pain;    Currently in Pain? Yes   Pain Score 2    Pain Location Shoulder   Pain Orientation Left   Pain Descriptors / Indicators Aching;Sore   Pain Type Surgical pain   Pain Onset 1 to 4 weeks ago            [facility] - 01/10/16 0001    AROM   Left Shoulder Extension 58 Degrees   Left Shoulder Flexion 95 Degrees   Left Shoulder ABduction 70 Degrees       TREATMENT:  Standing with wand: BUE chest press x15 BUE shoulder flexion x15 BUE IR with wand behind back x15 LUE shoulder abduction x15 BUE overhead press x15 Patient required mod VCs for correct exercise technique including positioning of wand and to increase ROM for better flexibility; Also required tactile cues to increase ROM; Utilized mGeologist, engineeringfor visual cues for better ROM;  Standing: Green tband over door: LUE shoulder extension with flexion assist x10, LUE shoulder adduction with abduction assist x10; Patient required min VCs to keep elbow straight for better shoulder ROM;  Prone: LUE shoulder extension 2# x15 LUE shoulder low rows 2# x15 LUE mid row 2# x15 with min VCs for correct positioning for  better scapular retraction;  Patient in right sidelying: LUE shoulder ER AROM 2# 2x12 with mod verbal and tactile cues for correct exercise technique; LUE shoulder flexion AROM 2# 2x12 with elbow bent and mod verbal and tactile cues to increase shoulder flexion ROM; LUE shoulder IR AAROM x10 LUE shoulder abduction 2x10 AROM without weight with tactile cues to increase ROM;  Patient supine: LUE shoulder flexion AROM with elbow bent x10 LUE Serratus punch with 2# x10  Posterior shoulder rolls x15; Finished with cryotherapy to left shoulder in sitting x5 min (unbilled);                       PT Education - 01/10/16 0844    Education provided Yes   Education Details shoulder ROM, strengthening;    Person(s) Educated  Patient   Methods Explanation;Verbal cues   Comprehension Verbalized understanding;Returned demonstration;Verbal cues required             PT Long Term Goals - 12/27/15 8127    PT LONG TERM GOAL #1   Title Patient will be independent in home exercise program to improve strength/mobility for better functional independence with ADLs. by 02/01/16   Time 8   Period Weeks   Status On-going   PT LONG TERM GOAL #2   Title Patient will decrease Quick DASH score by > 8 points demonstrating reduced self-reported upper extremity disability. by 02/01/16   Time 8   Period Weeks   Status Partially Met   PT LONG TERM GOAL #3   Title Patient will increase LUE shoulder AROM: sitting: Shoulder flexion >120 degrees, abduction: >120 degrees, supine: ER: >50 degrees, IR: >50 degrees for increased functional ROM with ADLs such as grooming, dressing etc; 02/01/16   Time 8   Period Weeks   Status Deferred   PT LONG TERM GOAL #4   Title Patient will increase LUE shoulder strength to >4/5 for increased functional strength with lifting groceries, doing household chores etc. by 02/01/16   Time 8   Period Weeks   Status Partially Met   PT LONG TERM GOAL #5   Title Patient will report a worst pain of 3/10 on VAS in    LUE         to improve tolerance with ADLs and reduced symptoms with activities. by 02/01/16   Baseline 10/10   Time 8   Period Weeks   Status Not Met               Plan - 01/10/16 0844    Clinical Impression Statement Patient seems to do better with AAROM and AROM as compared to PROM; She exhibits less guarding today and reports less discomfort with advanced ROM. She was able to tolerate advanced strengthening exercises well. She continues to be limited with shoulder flexion/abduction and IR/ER. She would benefit from additional skilled PT intervention to improve shoulder ROM, reduce pain and return to PLOF.    Rehab Potential Poor   Clinical Impairments Affecting Rehab Potential  positive: motivated, negative: co-morbidities, fear avoidance; Patient's clinical presentation is evolving as her pain is severe and she demonstrates significant fear avoidance limiting PROM;    PT Frequency 2x / week   PT Duration 8 weeks   PT Treatment/Interventions ADLs/Self Care Home Management;Cryotherapy;Electrical Stimulation;Moist Heat;Therapeutic exercise;Therapeutic activities;Functional mobility training;Ultrasound;Patient/family education;Manual techniques;Taping;Energy conservation;Dry needling   PT Next Visit Plan AROM, scapular strengthening, PROM, isometric   PT Home Exercise Plan continue as given;    Consulted and Agree  with Plan of Care Patient      Patient will benefit from skilled therapeutic intervention in order to improve the following deficits and impairments:  Decreased endurance, Obesity, Decreased knowledge of precautions, Decreased activity tolerance, Decreased strength, Impaired UE functional use, Pain, Increased muscle spasms, Decreased mobility, Decreased range of motion, Improper body mechanics, Postural dysfunction, Impaired flexibility  Visit Diagnosis: Left shoulder pain  Stiffness of left shoulder, not elsewhere classified  Muscle weakness (generalized)     Problem List Patient Active Problem List   Diagnosis Date Noted  . Pulmonary hypertension ([place]) 08/16/2012  . POTS (postural orthostatic tachycardia syndrome) 08/16/2012  . Chest pain 06/26/2012  . Shortness of breath 06/26/2012  . Rapid heart beat 06/26/2012    Adiba Fargnoli PT, DPT 01/10/2016, 8:45 AM  [place] MAIN [facility] SERVICES [address] [place], [place], [address] Phone: [phone]   Fax:  [phone]  Name: KALENNA MILLETT MRN: [id number] Date of Birth: [birth year]

## 2016-01-15 ENCOUNTER — Encounter: Payer: Self-pay | Admitting: Physical Therapy

## 2016-01-15 ENCOUNTER — Ambulatory Visit: Payer: 59 | Admitting: Physical Therapy

## 2016-01-15 DIAGNOSIS — M25612 Stiffness of left shoulder, not elsewhere classified: Secondary | ICD-10-CM

## 2016-01-15 DIAGNOSIS — M25512 Pain in left shoulder: Secondary | ICD-10-CM | POA: Diagnosis not present

## 2016-01-15 DIAGNOSIS — M6281 Muscle weakness (generalized): Secondary | ICD-10-CM | POA: Diagnosis not present

## 2016-01-15 NOTE — Therapy (Signed)
[place] MAIN [facility] SERVICES [address] [place], [place], [address] Phone: [phone]   Fax:  [phone]  Physical Therapy Treatment  Patient Details  Name: Claudia [name] MRN: [id number] Date of Birth: [birth year] Referring Provider: Thornton Park MD  Encounter Date: 01/15/2016      PT End of Session - 01/15/16 0813    Visit Number 14   Number of Visits 20   Date for PT Re-Evaluation 02/01/16   PT Start Time 0803   PT Stop Time 0845   PT Time Calculation (min) 42 min   Activity Tolerance Patient tolerated treatment well   Behavior During Therapy [facility] for tasks assessed/performed      Past Medical History  Diagnosis Date  . Asthma     H/O YEARS AGO-NO INHALERS  . Dysrhythmia     H/O [facility] PROBLEMS SINCE 2013  . Rotator cuff tear     left  . Difficult intubation   . Arthritis     knees, back;     Past Surgical History  Procedure Laterality Date  . Carpal tunnel release      x2  . Tonsillectomy    . Colonoscopy    . Cholecystectomy    . Shoulder arthroscopy with open rotator cuff repair Left 10/31/2015    Procedure: SHOULDER ARTHROSCOPY, SUBACROMIAL DECOMPRESSION, BICEPS TENOTOMY AND MINI OPEN ROTATOR CUFF REPAIR;  Surgeon: Thornton Park, MD;  Location: [facility] ORS;  Service: Orthopedics;  Laterality: Left;    There were no vitals filed for this visit.      Subjective Assessment - 01/15/16 0809    Subjective Patient reports doing better today; she reports doing her HEP and getting a better pull with the pulleys.    Pertinent History personal factors affecting rehab: chronicity of left shoulder pain, fear avoidance, anxiety over injury; history of arthritis;    How long can you sit comfortably? 5-15mn;    How long can you stand comfortably? NA   How long can you walk comfortably? NA   Diagnostic tests had MRI which showed full thickness tear prior to surgery;    Patient Stated Goals Get back to moving LUE;  relieve some of pain;    Currently in Pain? Yes   Pain Score 3    Pain Location Shoulder   Pain Orientation Left   Pain Descriptors / Indicators Aching;Sore   Pain Type Chronic pain   Pain Onset 1 to 4 weeks ago        TREATMENT: Warm up on UBE level 2 backwards x3 min (Unbilled);  Standing with wand: BUE chest press x15 BUE shoulder flexion x15 BUE IR with wand behind back x15 LUE shoulder abduction x15 BUE overhead press x15 Patient required mod VCs for correct exercise technique including positioning of wand and to increase ROM for better flexibility; Also required tactile cues to increase ROM; Utilized mGeologist, engineeringfor visual cues for better ROM;  Facing wall: Pball up/down wall x10 with cues to keep LUE on ball for better shoulder flexion stretch; Pball push ups x10 with min VCs to increase forward weight bearing as elbows bend for better UE strengthening;  Finger walk LUE x5 reps with min VCs and tactile cues to avoid trunk lean and to reduce shoulder elevation for better glenohumeral joint movement;  Prone: LUE shoulder extension 2# x15 LUE shoulder low rows 2# x15 LUE mid row 2# x15 with min VCs for correct positioning for better scapular retraction;  Patient in right sidelying:  LUE shoulder ER AROM 2# 2x15  with min verbal and tactile cues for correct exercise technique; LUE shoulder flexion AROM 2# 2x10 with elbow bent and min verbal and tactile cues to increase shoulder flexion ROM; LUE shoulder abduction 2# x10 AROM with min VCs to increase ROM;  Standing red tband in BUE: Shoulder extension x15; Shoulder rows x15; Horizontal abduction (low) x5 with cues to keep elbow straight and to increase scapular retraction;  Doorway stretch 15 sec hold x1; LUE wall slides AAROM flexion x10; Patient able to demonstrate better shoulder flexion with wall slides as compared to other exercise. (100 degrees) Finished with cryotherapy to left shoulder in sitting x5 min  (unbilled);                              PT Education - 01/15/16 0813    Education provided Yes   Education Details shoulder ROM, exercise   Person(s) Educated Patient   Methods Explanation;Verbal cues   Comprehension Verbalized understanding;Returned demonstration;Verbal cues required             PT Long Term Goals - 12/27/15 6378    PT LONG TERM GOAL #1   Title Patient will be independent in home exercise program to improve strength/mobility for better functional independence with ADLs. by 02/01/16   Time 8   Period Weeks   Status On-going   PT LONG TERM GOAL #2   Title Patient will decrease Quick DASH score by > 8 points demonstrating reduced self-reported upper extremity disability. by 02/01/16   Time 8   Period Weeks   Status Partially Met   PT LONG TERM GOAL #3   Title Patient will increase LUE shoulder AROM: sitting: Shoulder flexion >120 degrees, abduction: >120 degrees, supine: ER: >50 degrees, IR: >50 degrees for increased functional ROM with ADLs such as grooming, dressing etc; 02/01/16   Time 8   Period Weeks   Status Deferred   PT LONG TERM GOAL #4   Title Patient will increase LUE shoulder strength to >4/5 for increased functional strength with lifting groceries, doing household chores etc. by 02/01/16   Time 8   Period Weeks   Status Partially Met   PT LONG TERM GOAL #5   Title Patient will report a worst pain of 3/10 on VAS in    LUE         to improve tolerance with ADLs and reduced symptoms with activities. by 02/01/16   Baseline 10/10   Time 8   Period Weeks   Status Not Met               Plan - 01/15/16 0843    Clinical Impression Statement Patient instructed in advanced shoulder ROM/strengthening exercise. She was able to demonstrate better AAROM in left shoulder flexion with wall slides. She is making progress though the progress is slow. Patient would benefit from additional skilled PT intervention to improve  shoulder ROM/strength and reduce pain with ADLs.    Rehab Potential Poor   Clinical Impairments Affecting Rehab Potential positive: motivated, negative: co-morbidities, fear avoidance; Patient's clinical presentation is evolving as her pain is severe and she demonstrates significant fear avoidance limiting PROM;    PT Frequency 2x / week   PT Duration 8 weeks   PT Treatment/Interventions ADLs/Self Care Home Management;Cryotherapy;Electrical Stimulation;Moist Heat;Therapeutic exercise;Therapeutic activities;Functional mobility training;Ultrasound;Patient/family education;Manual techniques;Taping;Energy conservation;Dry needling   PT Next Visit Plan AROM, scapular strengthening, PROM, isometric  PT Home Exercise Plan continue as given;    Consulted and Agree with Plan of Care Patient      Patient will benefit from skilled therapeutic intervention in order to improve the following deficits and impairments:  Decreased endurance, Obesity, Decreased knowledge of precautions, Decreased activity tolerance, Decreased strength, Impaired UE functional use, Pain, Increased muscle spasms, Decreased mobility, Decreased range of motion, Improper body mechanics, Postural dysfunction, Impaired flexibility  Visit Diagnosis: Left shoulder pain  Stiffness of left shoulder, not elsewhere classified  Muscle weakness (generalized)     Problem List Patient Active Problem List   Diagnosis Date Noted  . Pulmonary hypertension ([place]) 08/16/2012  . POTS (postural orthostatic tachycardia syndrome) 08/16/2012  . Chest pain 06/26/2012  . Shortness of breath 06/26/2012  . Rapid heart beat 06/26/2012    Trotter,Margaret PT, DPT 01/15/2016, 8:44 AM  [place] MAIN [facility] SERVICES [address] [place], [place], [address] Phone: [phone]   Fax:  [phone]  Name: Claudia [name] MRN: [id number] Date of Birth: 1966/08/03

## 2016-01-17 ENCOUNTER — Encounter: Payer: Self-pay | Admitting: Physical Therapy

## 2016-01-17 ENCOUNTER — Ambulatory Visit: Payer: 59 | Admitting: Physical Therapy

## 2016-01-17 DIAGNOSIS — M25612 Stiffness of left shoulder, not elsewhere classified: Secondary | ICD-10-CM | POA: Diagnosis not present

## 2016-01-17 DIAGNOSIS — M6281 Muscle weakness (generalized): Secondary | ICD-10-CM

## 2016-01-17 DIAGNOSIS — M25512 Pain in left shoulder: Secondary | ICD-10-CM | POA: Diagnosis not present

## 2016-01-17 NOTE — Therapy (Signed)
[place] MAIN [facility] SERVICES [address] [place], [place], [address] Phone: [phone]   Fax:  [phone]  Physical Therapy Treatment  Patient Details  Name: Claudia [name] MRN: [id number] Date of Birth: 11/02/1965 Referring Provider: Thornton Park MD  Encounter Date: 01/17/2016      PT End of Session - 01/17/16 0842    Visit Number 15   Number of Visits 20   Date for PT Re-Evaluation 02/01/16   PT Start Time 0803   PT Stop Time 0845   PT Time Calculation (min) 42 min   Activity Tolerance Patient tolerated treatment well;Patient limited by pain   Behavior During Therapy [facility] for tasks assessed/performed      Past Medical History  Diagnosis Date  . Asthma     H/O YEARS AGO-NO INHALERS  . Dysrhythmia     H/O [facility] PROBLEMS SINCE 2013  . Rotator cuff tear     left  . Difficult intubation   . Arthritis     knees, back;     Past Surgical History  Procedure Laterality Date  . Carpal tunnel release      x2  . Tonsillectomy    . Colonoscopy    . Cholecystectomy    . Shoulder arthroscopy with open rotator cuff repair Left 10/31/2015    Procedure: SHOULDER ARTHROSCOPY, SUBACROMIAL DECOMPRESSION, BICEPS TENOTOMY AND MINI OPEN ROTATOR CUFF REPAIR;  Surgeon: Thornton Park, MD;  Location: [facility] ORS;  Service: Orthopedics;  Laterality: Left;    There were no vitals filed for this visit.      Subjective Assessment - 01/17/16 0807    Subjective Patient reports doing some exercise this morning to loosen up her shoulder. She reports some stiffness but not significant.    Pertinent History personal factors affecting rehab: chronicity of left shoulder pain, fear avoidance, anxiety over injury; history of arthritis;    How long can you sit comfortably? 5-62mn;    How long can you stand comfortably? NA   How long can you walk comfortably? NA   Diagnostic tests had MRI which showed full thickness tear prior to surgery;     Patient Stated Goals Get back to moving LUE; relieve some of pain;    Currently in Pain? Yes   Pain Score 2    Pain Location Shoulder   Pain Orientation Left   Pain Descriptors / Indicators Aching;Sore;Tightness   Pain Type Chronic pain   Pain Onset 1 to 4 weeks ago            [facility] - [facility]    AROM   Left Shoulder Flexion 80 Degrees   Left Shoulder ABduction 58 Degrees         TREATMENT: Warm up on UBE level 2 backwards x3 min (Unbilled);  Standing with wand: BUE chest press x15 BUE shoulder flexion x15 BUE IR with wand behind back x15 LUE shoulder abduction x15 BUE overhead press x15 Patient required mod VCs for correct exercise technique including positioning of wand and to increase ROM for better flexibility; Also required tactile cues to increase ROM; Utilized mGeologist, engineeringfor visual cues for better ROM;  Facing wall: Pball up/down wall x12 with cues to keep LUE on ball for better shoulder flexion stretch; Pball push ups x15 with min VCs to increase forward weight bearing as elbows bend for better UE strengthening;  Prone: LUE shoulder extension 3# x10 LUE shoulder low rows 3# x10 LUE mid row 3# x10 with  min VCs for correct positioning for better scapular retraction;  Patient in right sidelying: LUE shoulder ER AROM 3# 2x10 with min verbal and tactile cues for correct exercise technique; LUE shoulder flexion AROM 3# 2x10 with elbow bent and min verbal and tactile cues to increase shoulder flexion ROM;  HOIST mid row plate #1 [id number] HOIST lat pull down plate #1 [id number] with mod VCs for positioning and to increase scapular retraction for better strengthening;  LUE wall slides AAROM flexion x10; Patient able to demonstrate better shoulder flexion with wall slides as compared to other exercise. (100 degrees)   PT performed PROM of LUE shoulder in all directions (flexion, abduction, IR/ER, extension) x5 each; Grade II-III LUE glenohumeral inferior joint  mobs 10 sec bouts x3 sets; See attached AROM. Patient requires increased cues to relax during manual therapy; She exhibits increased guarding due to pain which limits ROM;  Finished with cryotherapy to left shoulder in sitting x5 min (unbilled);                        PT Education - 01/17/16 0841    Education provided Yes   Education Details HEP reinforced, ROM, strengthening   Person(s) Educated Patient   Methods Explanation;Verbal cues   Comprehension Verbalized understanding;Returned demonstration;Verbal cues required             PT Long Term Goals - 12/27/15 2706    PT LONG TERM GOAL #1   Title Patient will be independent in home exercise program to improve strength/mobility for better functional independence with ADLs. by 02/01/16   Time 8   Period Weeks   Status On-going   PT LONG TERM GOAL #2   Title Patient will decrease Quick DASH score by > 8 points demonstrating reduced self-reported upper extremity disability. by 02/01/16   Time 8   Period Weeks   Status Partially Met   PT LONG TERM GOAL #3   Title Patient will increase LUE shoulder AROM: sitting: Shoulder flexion >120 degrees, abduction: >120 degrees, supine: ER: >50 degrees, IR: >50 degrees for increased functional ROM with ADLs such as grooming, dressing etc; 02/01/16   Time 8   Period Weeks   Status Deferred   PT LONG TERM GOAL #4   Title Patient will increase LUE shoulder strength to >4/5 for increased functional strength with lifting groceries, doing household chores etc. by 02/01/16   Time 8   Period Weeks   Status Partially Met   PT LONG TERM GOAL #5   Title Patient will report a worst pain of 3/10 on VAS in    LUE         to improve tolerance with ADLs and reduced symptoms with activities. by 02/01/16   Baseline 10/10   Time 8   Period Weeks   Status Not Met               Plan - 01/17/16 2376    Clinical Impression Statement Patient instructed in advanced LUE  strengthening exercise. she requires min VCs for correct exercise technique. Patient seems to do better with AROM and AAROM as compared to PROM. She continues to have excessive guarding with PROM tolerating less ROM as compared to AROM. She would benefit from additional skilled PT intervention to improve shoulder ROM, strengthening and reduce pain with ADLs.    Rehab Potential Poor   Clinical Impairments Affecting Rehab Potential positive: motivated, negative: co-morbidities, fear avoidance; Patient's clinical presentation is evolving as her  pain is severe and she demonstrates significant fear avoidance limiting PROM;    PT Frequency 2x / week   PT Duration 8 weeks   PT Treatment/Interventions ADLs/Self Care Home Management;Cryotherapy;Electrical Stimulation;Moist Heat;Therapeutic exercise;Therapeutic activities;Functional mobility training;Ultrasound;Patient/family education;Manual techniques;Taping;Energy conservation;Dry needling   PT Next Visit Plan AROM, scapular strengthening, PROM, isometric   PT Home Exercise Plan continue as given;    Consulted and Agree with Plan of Care Patient      Patient will benefit from skilled therapeutic intervention in order to improve the following deficits and impairments:  Decreased endurance, Obesity, Decreased knowledge of precautions, Decreased activity tolerance, Decreased strength, Impaired UE functional use, Pain, Increased muscle spasms, Decreased mobility, Decreased range of motion, Improper body mechanics, Postural dysfunction, Impaired flexibility  Visit Diagnosis: Left shoulder pain  Stiffness of left shoulder, not elsewhere classified  Muscle weakness (generalized)     Problem List Patient Active Problem List   Diagnosis Date Noted  . Pulmonary hypertension ([place]) 08/16/2012  . POTS (postural orthostatic tachycardia syndrome) 08/16/2012  . Chest pain 06/26/2012  . Shortness of breath 06/26/2012  . Rapid heart beat 06/26/2012     Trotter,Margaret PT, DPT 01/17/2016, 8:44 AM  [place] MAIN [facility] SERVICES [address] [place], [place], [address] Phone: [phone]   Fax:  [phone]  Name: TED LEONHART MRN: [id number] Date of Birth: [birth year]

## 2016-01-23 ENCOUNTER — Encounter: Payer: Self-pay | Admitting: Physical Therapy

## 2016-01-23 ENCOUNTER — Ambulatory Visit: Payer: 59 | Admitting: Physical Therapy

## 2016-01-23 DIAGNOSIS — M25612 Stiffness of left shoulder, not elsewhere classified: Secondary | ICD-10-CM | POA: Diagnosis not present

## 2016-01-23 DIAGNOSIS — M6281 Muscle weakness (generalized): Secondary | ICD-10-CM | POA: Diagnosis not present

## 2016-01-23 DIAGNOSIS — M25512 Pain in left shoulder: Secondary | ICD-10-CM | POA: Diagnosis not present

## 2016-01-23 NOTE — Therapy (Signed)
[place] MAIN [facility] SERVICES [address] [place], [place], [address] Phone: [phone]   Fax:  [phone]  Physical Therapy Treatment  Patient Details  Name: Claudia [name] MRN: [id number] Date of Birth: 1965/10/10 Referring Provider: Thornton Park MD  Encounter Date: 01/23/2016      PT End of Session - 01/23/16 0928    Visit Number 16   Number of Visits 20   Date for PT Re-Evaluation 02/01/16   PT Start Time 0915   PT Stop Time 1000   PT Time Calculation (min) 45 min   Activity Tolerance Patient tolerated treatment well;Patient limited by pain   Behavior During Therapy [facility] for tasks assessed/performed      Past Medical History  Diagnosis Date  . Asthma     H/O YEARS AGO-NO INHALERS  . Dysrhythmia     H/O [facility] PROBLEMS SINCE 2013  . Rotator cuff tear     left  . Difficult intubation   . Arthritis     knees, back;     Past Surgical History  Procedure Laterality Date  . Carpal tunnel release      x2  . Tonsillectomy    . Colonoscopy    . Cholecystectomy    . Shoulder arthroscopy with open rotator cuff repair Left 10/31/2015    Procedure: SHOULDER ARTHROSCOPY, SUBACROMIAL DECOMPRESSION, BICEPS TENOTOMY AND MINI OPEN ROTATOR CUFF REPAIR;  Surgeon: Thornton Park, MD;  Location: [facility] ORS;  Service: Orthopedics;  Laterality: Left;    There were no vitals filed for this visit.      Subjective Assessment - 01/23/16 0925    Subjective Patient reports doing some exercise this morning to loosen up her shoulder. She reports no pain today .    Pertinent History personal factors affecting rehab: chronicity of left shoulder pain, fear avoidance, anxiety over injury; history of arthritis;    How long can you sit comfortably? 5-54mn;    How long can you stand comfortably? NA   How long can you walk comfortably? NA   Diagnostic tests had MRI which showed full thickness tear prior to surgery;    Patient Stated Goals  Get back to moving LUE; relieve some of pain;    Pain Onset 1 to 4 weeks ago      Therapeutic exercise: Supine R shoulder serratus punch  20 x 2 sets; Supine R shoulder circles, CW 2 x 10, CCW 2 x 10; Supine horizontal abd/add with cane  Supine  with cane assist  for flexion 2 x 10;  PNF D1, D2  Finger ladder x 2 L sidelying R shoulder ER, no weight 2 x 10; Sidelying shoulder flex  x 20 x 2   PT performed PROM of LUE shoulder in all directions (flexion, abduction, IR/ER, extension) x5 each; Grade II-III LUE glenohumeral inferior joint mobs 10 sec bouts x3 sets; Ice following treatment                          PT Education - 01/23/16 [phone]   Education Details Reviewed ice and HEP for ROM   Person(s) Educated Patient   Methods Explanation   Comprehension Verbalized understanding             PT Long Term Goals - 12/27/15 04081   PT LONG TERM GOAL #1   Title Patient will be independent in home exercise program to improve strength/mobility for better functional independence with ADLs. by  02/01/16   Time 8   Period Weeks   Status On-going   PT LONG TERM GOAL #2   Title Patient will decrease Quick DASH score by > 8 points demonstrating reduced self-reported upper extremity disability. by 02/01/16   Time 8   Period Weeks   Status Partially Met   PT LONG TERM GOAL #3   Title Patient will increase LUE shoulder AROM: sitting: Shoulder flexion >120 degrees, abduction: >120 degrees, supine: ER: >50 degrees, IR: >50 degrees for increased functional ROM with ADLs such as grooming, dressing etc; 02/01/16   Time 8   Period Weeks   Status Deferred   PT LONG TERM GOAL #4   Title Patient will increase LUE shoulder strength to >4/5 for increased functional strength with lifting groceries, doing household chores etc. by 02/01/16   Time 8   Period Weeks   Status Partially Met   PT LONG TERM GOAL #5   Title Patient will report a worst pain of 3/10 on VAS in    LUE          to improve tolerance with ADLs and reduced symptoms with activities. by 02/01/16   Baseline 10/10   Time 8   Period Weeks   Status Not Met               Plan - 01/23/16 0929    Clinical Impression Statement Pateint is able to perform LUE shoulder exercises with some pain behaviors in mid and end range. She is able to perform some exercises with light weights for resistance without  increased pain.    Rehab Potential Poor   Clinical Impairments Affecting Rehab Potential positive: motivated, negative: co-morbidities, fear avoidance; Patient's clinical presentation is evolving as her pain is severe and she demonstrates significant fear avoidance limiting PROM;    PT Frequency 2x / week   PT Duration 8 weeks   PT Treatment/Interventions ADLs/Self Care Home Management;Cryotherapy;Electrical Stimulation;Moist Heat;Therapeutic exercise;Therapeutic activities;Functional mobility training;Ultrasound;Patient/family education;Manual techniques;Taping;Energy conservation;Dry needling   PT Next Visit Plan AROM, scapular strengthening, PROM, isometric   PT Home Exercise Plan continue as given;    Consulted and Agree with Plan of Care Patient      Patient will benefit from skilled therapeutic intervention in order to improve the following deficits and impairments:  Decreased endurance, Obesity, Decreased knowledge of precautions, Decreased activity tolerance, Decreased strength, Impaired UE functional use, Pain, Increased muscle spasms, Decreased mobility, Decreased range of motion, Improper body mechanics, Postural dysfunction, Impaired flexibility  Visit Diagnosis: Muscle weakness (generalized)     Problem List Patient Active Problem List   Diagnosis Date Noted  . Pulmonary hypertension ([place]) 08/16/2012  . POTS (postural orthostatic tachycardia syndrome) 08/16/2012  . Chest pain 06/26/2012  . Shortness of breath 06/26/2012  . Rapid heart beat 06/26/2012  Alanson Puls, PT,  DPT  [place], Minette Headland S 01/23/2016, 9:32 AM  [place] MAIN [facility] SERVICES [address] [place], [place], [address] Phone: [phone]   Fax:  [phone]  Name: Claudia [name] MRN: [id number] Date of Birth: 1965-09-09

## 2016-01-25 ENCOUNTER — Ambulatory Visit: Payer: 59 | Attending: Orthopedic Surgery | Admitting: Physical Therapy

## 2016-01-25 ENCOUNTER — Encounter: Payer: Self-pay | Admitting: Physical Therapy

## 2016-01-25 DIAGNOSIS — M25512 Pain in left shoulder: Secondary | ICD-10-CM | POA: Diagnosis not present

## 2016-01-25 DIAGNOSIS — M25612 Stiffness of left shoulder, not elsewhere classified: Secondary | ICD-10-CM | POA: Insufficient documentation

## 2016-01-25 DIAGNOSIS — M6281 Muscle weakness (generalized): Secondary | ICD-10-CM | POA: Insufficient documentation

## 2016-01-25 NOTE — Therapy (Signed)
[place] MAIN [facility] SERVICES [address] [place], [place], [address] Phone: [phone]   Fax:  [phone]  Physical Therapy Treatment  Patient Details  Name: Claudia [name] MRN: [id number] Date of Birth: Jul 14, 1966 Referring Provider: Thornton Park MD  Encounter Date: 01/25/2016      PT End of Session - 01/25/16 1535    Visit Number 17   Number of Visits 20   Date for PT Re-Evaluation 02/01/16   PT Start Time 1519   PT Stop Time 1612   PT Time Calculation (min) 53 min   Activity Tolerance Patient tolerated treatment well;Patient limited by pain   Behavior During Therapy [facility] for tasks assessed/performed      Past Medical History  Diagnosis Date  . Asthma     H/O YEARS AGO-NO INHALERS  . Dysrhythmia     H/O [facility] PROBLEMS SINCE 2013  . Rotator cuff tear     left  . Difficult intubation   . Arthritis     knees, back;     Past Surgical History  Procedure Laterality Date  . Carpal tunnel release      x2  . Tonsillectomy    . Colonoscopy    . Cholecystectomy    . Shoulder arthroscopy with open rotator cuff repair Left 10/31/2015    Procedure: SHOULDER ARTHROSCOPY, SUBACROMIAL DECOMPRESSION, BICEPS TENOTOMY AND MINI OPEN ROTATOR CUFF REPAIR;  Surgeon: Thornton Park, MD;  Location: [facility] ORS;  Service: Orthopedics;  Laterality: Left;    There were no vitals filed for this visit.      Subjective Assessment - 01/25/16 1525    Subjective Patient reports increased left shoulder soreness today as compared to previous sessions. "I know that it is going to hurt so its okay. I think that I worked some of my rotator cuff last time."    Pertinent History personal factors affecting rehab: chronicity of left shoulder pain, fear avoidance, anxiety over injury; history of arthritis;    How long can you sit comfortably? 5-1mn;    How long can you stand comfortably? NA   How long can you walk comfortably? NA   Diagnostic  tests had MRI which showed full thickness tear prior to surgery;    Patient Stated Goals Get back to moving LUE; relieve some of pain;    Currently in Pain? Yes   Pain Score 4    Pain Location Shoulder   Pain Orientation Left   Pain Descriptors / Indicators Aching;Sore;Tightness   Pain Type Chronic pain   Pain Onset 1 to 4 weeks ago         TREATMENT: Standing with wand: BUE chest press x15 BUE shoulder flexion x15 BUE IR with wand behind back x15 LUE shoulder abduction x15 BUE overhead press x15 Patient required mod VCs for correct exercise technique including positioning of wand and to increase ROM for better flexibility; Also required tactile cues to increase ROM; Utilized mGeologist, engineeringfor visual cues for better ROM;  Facing table, pushing LUE down into table, walking away from table to facilitate LUE glenohumeral inferior joint movement with flexion for better ROM x10 reps;  Facing wall: Pball up/down wall x10 with cues to keep LUE on ball for better shoulder flexion stretch; Pball push ups x15 with min VCs to increase forward weight bearing as elbows bend for better UE strengthening; BUE "V" shoulder flexion/abduction ER with forearm on wall x10 with cues to work on pushing out with LUE as patient has limited  shoulder ER:  Prone: LUE shoulder extension 3# x12 LUE shoulder low rows 3# x12 LUE mid row 3# x12 with min VCs for correct positioning for better scapular retraction; LUE grade II glenohumeral joint mobs 10 sec bouts x2 sets; patient reports increased LUE pain with joint mobs, not tolerating well;  Patient in right sidelying: LUE shoulder ER AROM 3# 2x12  with min verbal and tactile cues for correct exercise technique; LUE shoulder flexion AROM 3# 2x12 with elbow bent and min verbal and tactile cues to increase shoulder flexion ROM;   Patient supine: LUE rhythmic stabilization towards proximal shoulder with minimal resistance x30 sec x2 sets; LUE PNF D1/D2 yellow tband  2x5 each with verbal and tactile cues to increase ROM;  Standing: BUE shoulder flexion, abduction AROM x5 reps;   Patient able to demonstrate better shoulder ROM with exercise as compared to PROM. She is progressing, but slowly;                          PT Education - 01/25/16 1535    Education provided Yes   Education Details HEP reinforced, ROM, strengthening;    Person(s) Educated Patient   Methods Explanation;Verbal cues   Comprehension Verbalized understanding;Returned demonstration;Verbal cues required             PT Long Term Goals - 12/27/15 9381    PT LONG TERM GOAL #1   Title Patient will be independent in home exercise program to improve strength/mobility for better functional independence with ADLs. by 02/01/16   Time 8   Period Weeks   Status On-going   PT LONG TERM GOAL #2   Title Patient will decrease Quick DASH score by > 8 points demonstrating reduced self-reported upper extremity disability. by 02/01/16   Time 8   Period Weeks   Status Partially Met   PT LONG TERM GOAL #3   Title Patient will increase LUE shoulder AROM: sitting: Shoulder flexion >120 degrees, abduction: >120 degrees, supine: ER: >50 degrees, IR: >50 degrees for increased functional ROM with ADLs such as grooming, dressing etc; 02/01/16   Time 8   Period Weeks   Status Deferred   PT LONG TERM GOAL #4   Title Patient will increase LUE shoulder strength to >4/5 for increased functional strength with lifting groceries, doing household chores etc. by 02/01/16   Time 8   Period Weeks   Status Partially Met   PT LONG TERM GOAL #5   Title Patient will report a worst pain of 3/10 on VAS in    LUE         to improve tolerance with ADLs and reduced symptoms with activities. by 02/01/16   Baseline 10/10   Time 8   Period Weeks   Status Not Met               Plan - 01/25/16 1610    Clinical Impression Statement Patient instructed in advanced LUE strengthening  exercise. Introduced more tband exercise and resisted ROM for better shoulder ROM and strengthening; Patient seems to tolerate AROM and strengthening more than PROM. She would benefit from additional skilled PT intervention to improve shoulder ROM and reduce pain with ADLs.    Rehab Potential Poor   Clinical Impairments Affecting Rehab Potential positive: motivated, negative: co-morbidities, fear avoidance; Patient's clinical presentation is evolving as her pain is severe and she demonstrates significant fear avoidance limiting PROM;    PT Frequency 2x / week  PT Duration 8 weeks   PT Treatment/Interventions ADLs/Self Care Home Management;Cryotherapy;Electrical Stimulation;Moist Heat;Therapeutic exercise;Therapeutic activities;Functional mobility training;Ultrasound;Patient/family education;Manual techniques;Taping;Energy conservation;Dry needling   PT Next Visit Plan AROM, scapular strengthening, PROM, isometric   PT Home Exercise Plan advanced - see patient instructions;    Consulted and Agree with Plan of Care Patient      Patient will benefit from skilled therapeutic intervention in order to improve the following deficits and impairments:  Decreased endurance, Obesity, Decreased knowledge of precautions, Decreased activity tolerance, Decreased strength, Impaired UE functional use, Pain, Increased muscle spasms, Decreased mobility, Decreased range of motion, Improper body mechanics, Postural dysfunction, Impaired flexibility  Visit Diagnosis: Muscle weakness (generalized)  Left shoulder pain  Stiffness of left shoulder, not elsewhere classified     Problem List Patient Active Problem List   Diagnosis Date Noted  . Pulmonary hypertension ([place]) 08/16/2012  . POTS (postural orthostatic tachycardia syndrome) 08/16/2012  . Chest pain 06/26/2012  . Shortness of breath 06/26/2012  . Rapid heart beat 06/26/2012    Davy Faught PT, DPT 01/25/2016, 4:12 PM  [place] MAIN [facility] SERVICES [address] [place], [place], [address] Phone: [phone]   Fax:  [phone]  Name: Claudia [name] MRN: [id number] Date of Birth: 1965-10-31

## 2016-01-25 NOTE — Patient Instructions (Addendum)
  SHOULDER: Flexion On Table    Pushing down into table, walk backwards while pushing down for increased stretch on left shoulder. Repeat 10 times  2 times a day;   Copyright  VHI. All rights reserved.  SHOULDER: Flexion At Wall    Slide both arms up wall while leaning gently into wall into "V" position. Maintain upright posture and tuck in stomach. Hold __2_ seconds. __10_ reps per set, __2_ sets per day, _5__ days per week  Copyright  VHI. All rights reserved.  SHOULDER: Diagonal - Down and Away    Lying down, Start with left hand down by left pocket and pull left arm to right ear _10__ reps per set, 2___ sets per day, _5__ days per week  Copyright  VHI. All rights reserved.  SHOULDER: Diagonal - Up and Away    Lying down, start with left hand reaching towards right pocket and pull arm up and out like drawing a sword.  10___ reps per set, _2__ sets per day, __5_ days per week  Copyright  VHI. All rights reserved.

## 2016-01-31 ENCOUNTER — Ambulatory Visit: Payer: 59 | Admitting: Physical Therapy

## 2016-01-31 ENCOUNTER — Encounter: Payer: Self-pay | Admitting: Physical Therapy

## 2016-01-31 DIAGNOSIS — M25612 Stiffness of left shoulder, not elsewhere classified: Secondary | ICD-10-CM | POA: Diagnosis not present

## 2016-01-31 DIAGNOSIS — M6281 Muscle weakness (generalized): Secondary | ICD-10-CM | POA: Diagnosis not present

## 2016-01-31 DIAGNOSIS — M25512 Pain in left shoulder: Secondary | ICD-10-CM

## 2016-01-31 NOTE — Therapy (Signed)
[place] MAIN [facility] SERVICES [address] [place], [place], [address] Phone: [phone]   Fax:  [phone]  Physical Therapy Treatment/Progress Note  Patient Details  Name: Claudia [name] MRN: [id number] Date of Birth: 01/02/1966 Referring Provider: Thornton Park MD  Encounter Date: 01/31/2016      PT End of Session - 01/31/16 0922    Visit Number 18   Number of Visits 28   Date for PT Re-Evaluation 02/28/16   PT Start Time 0835   PT Stop Time 0920   PT Time Calculation (min) 45 min   Activity Tolerance Patient tolerated treatment well   Behavior During Therapy [facility] for tasks assessed/performed      Past Medical History  Diagnosis Date  . Asthma     H/O YEARS AGO-NO INHALERS  . Dysrhythmia     H/O [facility] PROBLEMS SINCE 2013  . Rotator cuff tear     left  . Difficult intubation   . Arthritis     knees, back;     Past Surgical History  Procedure Laterality Date  . Carpal tunnel release      x2  . Tonsillectomy    . Colonoscopy    . Cholecystectomy    . Shoulder arthroscopy with open rotator cuff repair Left 10/31/2015    Procedure: SHOULDER ARTHROSCOPY, SUBACROMIAL DECOMPRESSION, BICEPS TENOTOMY AND MINI OPEN ROTATOR CUFF REPAIR;  Surgeon: Thornton Park, MD;  Location: [facility] ORS;  Service: Orthopedics;  Laterality: Left;    There were no vitals filed for this visit.      Subjective Assessment - 01/31/16 0839    Subjective patient reports increased soreness this AM; "I have been having some poppping and clicking is that normal?" Patient reports still using TENs unit and ice for pain management;   Pertinent History personal factors affecting rehab: chronicity of left shoulder pain, fear avoidance, anxiety over injury; history of arthritis;    How long can you sit comfortably? 5-12mn;    How long can you stand comfortably? NA   How long can you walk comfortably? NA   Diagnostic tests had MRI which showed full  thickness tear prior to surgery;    Patient Stated Goals Get back to moving LUE; relieve some of pain;    Currently in Pain? Yes   Pain Score 2    Pain Location Shoulder   Pain Orientation Left   Pain Descriptors / Indicators Aching;Sore;Tightness   Pain Type Chronic pain   Pain Onset 1 to 4 weeks ago            [facility] - 01/31/16 0001    Observation/Other Assessments   Quick DASH  68% (the higher the score the greater the disability, improved from last reassessment on 12/27/15 which was 88.6%)   AROM   Left Shoulder Extension 50 Degrees   Left Shoulder Flexion 100 Degrees   Left Shoulder ABduction 70 Degrees   Left Shoulder Internal Rotation 35 Degrees   Left Shoulder External Rotation 45 Degrees     In sidelying: LUE shoulder Abduction AROM approximately 90 degrees; In supine: LUE shoulder flexion AROM 130 degrees;   TREATMENT: Standing with wand: BUE chest press x15 BUE shoulder flexion x15 BUE IR with wand behind back x15 LUE shoulder abduction x15 BUE overhead press x15 Patient required mod VCs for correct exercise technique including positioning of wand and to increase ROM for better flexibility; Also required tactile cues to increase ROM; Utilized mGeologist, engineeringfor visual cues  for better ROM;  UE Ranger (on wall): LUE shoulder flexion/extension x5; LUE shoulder circles clockwise/counterclockwise x5 each; Alphabet A-Z with cues to keep elbow straight for better shoulder ROM;  Facing wall: Pball up/down wall x10 with cues to keep LUE on ball for better shoulder flexion stretch; Pball push ups x10 with min VCs to increase shoulder protraction after push up for better scapular strengthening; BUE "V" shoulder flexion/abduction ER with forearm on wall x10 with cues to work on pushing out with LUE as patient has limited shoulder ER:  Prone: LUE shoulder extension 3# x15 LUE shoulder low rows 3# x15 LUE mid row 3# x15with min VCs for correct positioning for better  scapular retraction; LUE shoulder horizontal abduction 2# x12 with cues to keep elbow straight and increase scapular retraction for better strengthening;  Patient in right sidelying: LUE shoulder ER AROM 3# 2x15 with min verbal and tactile cues for correct exercise technique; LUE shoulder flexion AROM 3# 2x15with elbow bent and min verbal and tactile cues to increase shoulder flexion ROM;   Patient supine: LUE PNF D1/D2 yellow tband 2x10 each with verbal and tactile cues to increase ROM;  PT assessed LUE shoulder AROM, see above; addressed goals;  Patient able to demonstrate better shoulder ROM with exercise as compared to PROM. She is progressing, but slowly;                             PT Education - 01/31/16 0919    Education provided Yes   Education Details HEP reinforced; ROM, strengthening;    Person(s) Educated Patient   Methods Explanation;Verbal cues   Comprehension Verbalized understanding;Returned demonstration;Verbal cues required             PT Long Term Goals - 01/31/16 0917    PT LONG TERM GOAL #1   Title Patient will be independent in home exercise program to improve strength/mobility for better functional independence with ADLs. by 02/01/16   Time 8   Period Weeks   Status On-going   PT LONG TERM GOAL #2   Title Patient will decrease Quick DASH score by > 8 points demonstrating reduced self-reported upper extremity disability. by 02/01/16   Time 8   Period Weeks   Status Partially Met   PT LONG TERM GOAL #3   Title Patient will increase LUE shoulder AROM: sitting: Shoulder flexion >120 degrees, abduction: >120 degrees, supine: ER: >50 degrees, IR: >50 degrees for increased functional ROM with ADLs such as grooming, dressing etc; 02/01/16   Time 8   Period Weeks   Status Partially Met   PT LONG TERM GOAL #4   Title Patient will increase LUE shoulder strength to >4/5 for increased functional strength with lifting groceries, doing  household chores etc. by 02/01/16   Time 8   Period Weeks   Status Partially Met   PT LONG TERM GOAL #5   Title Patient will report a worst pain of 3/10 on VAS in    LUE         to improve tolerance with ADLs and reduced symptoms with activities. by 02/01/16   Baseline 8/10   Time 8   Period Weeks   Status Not Met               Plan - 01/31/16 9485    Clinical Impression Statement Instructed patient in LUE shoulder ROM/strengthening exercise. She continues to be more guarded with PROM as compared to  AAROM/AROM; Patient able to progress with strengthening with increased resistance and repetition; She is able to achieve better AROM in supine with shoulder flexion as compared to standing due to gravity assisted position. She would benefit from additional skilled PT intervention to improve shoulder ROM/strength and return to PLOF. Overall she is demonstrating improved functional mobility with decreased score on Quick Dash;    Rehab Potential Poor   Clinical Impairments Affecting Rehab Potential positive: motivated, negative: co-morbidities, fear avoidance; Patient's clinical presentation is evolving as her pain is severe and she demonstrates significant fear avoidance limiting PROM;    PT Frequency 2x / week   PT Duration 4 weeks   PT Treatment/Interventions ADLs/Self Care Home Management;Cryotherapy;Electrical Stimulation;Moist Heat;Therapeutic exercise;Therapeutic activities;Functional mobility training;Ultrasound;Patient/family education;Manual techniques;Taping;Energy conservation;Dry needling   PT Next Visit Plan AROM, scapular strengthening, PROM, isometric   PT Home Exercise Plan continue as given;    Consulted and Agree with Plan of Care Patient      Patient will benefit from skilled therapeutic intervention in order to improve the following deficits and impairments:  Decreased endurance, Obesity, Decreased knowledge of precautions, Decreased activity tolerance, Decreased  strength, Impaired UE functional use, Pain, Increased muscle spasms, Decreased mobility, Decreased range of motion, Improper body mechanics, Postural dysfunction, Impaired flexibility  Visit Diagnosis: Muscle weakness (generalized) - Plan: PT plan of care cert/re-cert  Left shoulder pain - Plan: PT plan of care cert/re-cert  Stiffness of left shoulder, not elsewhere classified - Plan: PT plan of care cert/re-cert     Problem List Patient Active Problem List   Diagnosis Date Noted  . Pulmonary hypertension ([place]) 08/16/2012  . POTS (postural orthostatic tachycardia syndrome) 08/16/2012  . Chest pain 06/26/2012  . Shortness of breath 06/26/2012  . Rapid heart beat 06/26/2012    Trotter,Margaret PT, DPT 01/31/2016, 9:26 AM  [place] MAIN [facility] SERVICES [address] [place], [place], [address] Phone: [phone]   Fax:  [phone]  Name: Claudia [name] MRN: [id number] Date of Birth: [birth year]

## 2016-02-01 DIAGNOSIS — M75122 Complete rotator cuff tear or rupture of left shoulder, not specified as traumatic: Secondary | ICD-10-CM | POA: Diagnosis not present

## 2016-02-02 ENCOUNTER — Ambulatory Visit: Payer: 59 | Admitting: Physical Therapy

## 2016-02-02 ENCOUNTER — Encounter: Payer: Self-pay | Admitting: Physical Therapy

## 2016-02-02 DIAGNOSIS — M25612 Stiffness of left shoulder, not elsewhere classified: Secondary | ICD-10-CM

## 2016-02-02 DIAGNOSIS — M25512 Pain in left shoulder: Secondary | ICD-10-CM | POA: Diagnosis not present

## 2016-02-02 DIAGNOSIS — M6281 Muscle weakness (generalized): Secondary | ICD-10-CM

## 2016-02-02 NOTE — Patient Instructions (Signed)
Scapular Retraction: Rowing (Eccentric) - Arms - Side (Resistance Band)    Hold end of band in each hand. Pull back until elbows are even with trunk. Keep elbows by sides, thumbs up. Slowly release for 3-5 seconds. Use __red___ resistance band. _15_ reps per set, _1-2_ sets per day, __5_ days per week.   http://ecce.exer.us/227   Copyright  VHI. All rights reserved.  BACK: Child's Pose (Sciatica)    Sit in knee-chest position and reach arms forward. Separate knees for comfort. Hold position for _3 seconds. Repeat _5-10_ times. Do __1-2_ times per day.  Copyright  VHI. All rights reserved.  ROM: Rocking (All-Fours)    On hands and knees, gently lean forward, back, and side-to-side. Move slowly, smoothly, and without pain. Repeat for __2-3__ minutes or __10__ times per set. Do _2_ sets per session. Do _1-2__ sessions per day.  http://orth.exer.us/883   Copyright  VHI. All rights reserved.

## 2016-02-02 NOTE — Therapy (Signed)
[place] MAIN [facility] SERVICES [address] [place], [place], [address] Phone: [phone]   Fax:  [phone]  Physical Therapy Treatment  Patient Details  Name: Claudia [name] MRN: [id number] Date of Birth: 12-Oct-1965 Referring Provider: Thornton Park MD  Encounter Date: 02/02/2016      PT End of Session - 02/02/16 1126    Visit Number 19   Number of Visits 28   Date for PT Re-Evaluation 02/28/16   PT Start Time 0935   PT Stop Time 1016   PT Time Calculation (min) 41 min   Activity Tolerance Patient tolerated treatment well   Behavior During Therapy [facility] for tasks assessed/performed      Past Medical History  Diagnosis Date  . Asthma     H/O YEARS AGO-NO INHALERS  . Dysrhythmia     H/O [facility] PROBLEMS SINCE 2013  . Rotator cuff tear     left  . Difficult intubation   . Arthritis     knees, back;     Past Surgical History  Procedure Laterality Date  . Carpal tunnel release      x2  . Tonsillectomy    . Colonoscopy    . Cholecystectomy    . Shoulder arthroscopy with open rotator cuff repair Left 10/31/2015    Procedure: SHOULDER ARTHROSCOPY, SUBACROMIAL DECOMPRESSION, BICEPS TENOTOMY AND MINI OPEN ROTATOR CUFF REPAIR;  Surgeon: Thornton Park, MD;  Location: [facility] ORS;  Service: Orthopedics;  Laterality: Left;    There were no vitals filed for this visit.      Subjective Assessment - 02/02/16 0935    Subjective Patient reports having doctors visit yesterday which she reported the MD noting that she was a little behind with shoulder weakness and mobility and could be developing scar tissue. Patient reported that MD wanted a continuation of therapy to work on deficits. HEP going okay.     Patient is accompained by: Family member   Pertinent History personal factors affecting rehab: chronicity of left shoulder pain, fear avoidance, anxiety over injury; history of arthritis;    How long can you sit comfortably?  5-49mn;    How long can you stand comfortably? NA   How long can you walk comfortably? NA   Diagnostic tests had MRI which showed full thickness tear prior to surgery;    Patient Stated Goals Get back to moving LUE; relieve some of pain;    Currently in Pain? Yes   Pain Score 2    Pain Location Shoulder   Pain Orientation Left   Pain Descriptors / Indicators Aching;Sore;Tightness   Pain Onset 1 to 4 weeks ago         Treatment:   Patient perform AAROM with PVC pipe x 15 reps in all the following directions: shoulder flexion, extension, abduction, internal rotation Instructed to watch herself in the mirror to decrease shoulder shrugging compensation.  Shoulder IR with towel assist x 15 reps, difficulty for patient to perform due to limited shoulder mobility   Wall slides with towel and 5 external cues (post-it notes) 3 sets x 5 reps.  Min instruction to decrease shoulder shrugging and increase shoulder ROM  Shoulder horizontal ABD and ER with red theraband at wall; 2 x 10 reps. Patient unable to perform ER with BUE; using mostly shoulder horizontal ABD.  Seated scapular rows with red theraband 2 x 15 reps; min cueing on form.  Quadraped weight shifts forwards, backwards, and side to side 3-4 minutes x 3  sets.  Rested in between sets.  Measured shoulder flexion in quadraped: 107 degrees with no compensation.  Instructed on child's pose and to increase distance moving behind backwards as shoulder loosens up but not to bounce in order protect the shoulder and increase ROM.   While SPT was gathering and instructing patient in HEP, patient was seated with cold pack on shoulder 5-7 minutes.                         PT Education - 02/02/16 1124    Education provided Yes   Education Details HEP progression, compensation tendencies, general shoulder movements   Person(s) Educated Patient   Methods Explanation;Demonstration;Verbal cues   Comprehension Verbalized  understanding;Returned demonstration             PT Long Term Goals - 02/02/16 1132    PT LONG TERM GOAL #1   Title Patient will be independent in home exercise program to improve strength/mobility for better functional independence with ADLs.    Time 4   Period Weeks   Status On-going   PT LONG TERM GOAL #2   Title Patient will decrease Quick DASH score by > 8 points demonstrating reduced self-reported upper extremity disability.    Time 4   Period Weeks   Status Partially Met   PT LONG TERM GOAL #3   Title Patient will increase LUE shoulder AROM: sitting: Shoulder flexion >120 degrees, abduction: >120 degrees, supine: ER: >50 degrees, IR: >50 degrees for increased functional ROM with ADLs such as grooming, dressing etc   Time 4   Period Weeks   Status Partially Met   PT LONG TERM GOAL #4   Title Patient will increase LUE shoulder strength to >4/5 for increased functional strength with lifting groceries, doing household chores etc.    Time 4   Period Weeks   Status Partially Met   PT LONG TERM GOAL #5   Title Patient will report a worst pain of 3/10 on VAS in    LUE         to improve tolerance with ADLs and reduced symptoms with activities.    Baseline 8/10   Time 4   Period Weeks   Status Not Met               Plan - 02/02/16 1127    Clinical Impression Statement Patient presented with less pain in shoulder but noted that she had taken med for it previously.  With AAROM patient is still compensating with shoulder shrugging and has limited ROM throughout. During most exercises, patient reports a "pulling" like a stretching or working feeling but states that there is no pain.  With new activities initiated, patient performed with min verbal cues and instructions. Patient had improved shoulder flexion in quadraped with no compensation and good should stability. Patient would  continue to benenfit from additional skilled PT to improved shoulder ROM, strength, and ability  to perform ADLs.    Rehab Potential Poor   Clinical Impairments Affecting Rehab Potential positive: motivated, negative: co-morbidities, fear avoidance; Patient's clinical presentation is evolving as her pain is severe and she demonstrates significant fear avoidance limiting PROM;    PT Frequency 2x / week   PT Duration 4 weeks   PT Treatment/Interventions ADLs/Self Care Home Management;Cryotherapy;Electrical Stimulation;Moist Heat;Therapeutic exercise;Therapeutic activities;Functional mobility training;Ultrasound;Patient/family education;Manual techniques;Taping;Energy conservation;Dry needling   PT Next Visit Plan AROM, scapular strengthening, PROM, isometric   PT Home Exercise Plan continue as  given;    Consulted and Agree with Plan of Care Patient      Patient will benefit from skilled therapeutic intervention in order to improve the following deficits and impairments:  Decreased endurance, Obesity, Decreased knowledge of precautions, Decreased activity tolerance, Decreased strength, Impaired UE functional use, Pain, Increased muscle spasms, Decreased mobility, Decreased range of motion, Improper body mechanics, Postural dysfunction, Impaired flexibility  Visit Diagnosis: Muscle weakness (generalized)  Left shoulder pain  Stiffness of left shoulder, not elsewhere classified     Problem List Patient Active Problem List   Diagnosis Date Noted  . Pulmonary hypertension ([place]) 08/16/2012  . POTS (postural orthostatic tachycardia syndrome) 08/16/2012  . Chest pain 06/26/2012  . Shortness of breath 06/26/2012  . Rapid heart beat 06/26/2012   Tilman Neat, SPT  Trotter,Margaret PT, DPT 02/02/2016, 11:50 AM  [place] MAIN [facility] SERVICES [address] [place], [place], [address] Phone: [phone]   Fax:  [phone]  Name: Claudia [name] MRN: [id number] Date of Birth: 04/05/1966

## 2016-02-06 ENCOUNTER — Ambulatory Visit: Payer: 59 | Admitting: Physical Therapy

## 2016-02-06 ENCOUNTER — Encounter: Payer: Self-pay | Admitting: Physical Therapy

## 2016-02-06 DIAGNOSIS — M6281 Muscle weakness (generalized): Secondary | ICD-10-CM

## 2016-02-06 DIAGNOSIS — M25612 Stiffness of left shoulder, not elsewhere classified: Secondary | ICD-10-CM

## 2016-02-06 DIAGNOSIS — M25512 Pain in left shoulder: Secondary | ICD-10-CM

## 2016-02-06 NOTE — Therapy (Signed)
[place] MAIN [facility] SERVICES [address] [place], [place], [address] Phone: [phone]   Fax:  [phone]  Physical Therapy Treatment  Patient Details  Name: Claudia [name] MRN: [id number] Date of Birth: 08/14/1966 Referring Provider: Thornton Park MD  Encounter Date: 02/06/2016      PT End of Session - 02/06/16 0856    Visit Number 20   Number of Visits 28   Date for PT Re-Evaluation 02/28/16   PT Start Time 0805   PT Stop Time 0850   PT Time Calculation (min) 45 min   Activity Tolerance Patient tolerated treatment well;No increased pain;Patient limited by pain   Behavior During Therapy [facility] for tasks assessed/performed      Past Medical History  Diagnosis Date  . Asthma     H/O YEARS AGO-NO INHALERS  . Dysrhythmia     H/O [facility] PROBLEMS SINCE 2013  . Rotator cuff tear     left  . Difficult intubation   . Arthritis     knees, back;     Past Surgical History  Procedure Laterality Date  . Carpal tunnel release      x2  . Tonsillectomy    . Colonoscopy    . Cholecystectomy    . Shoulder arthroscopy with open rotator cuff repair Left 10/31/2015    Procedure: SHOULDER ARTHROSCOPY, SUBACROMIAL DECOMPRESSION, BICEPS TENOTOMY AND MINI OPEN ROTATOR CUFF REPAIR;  Surgeon: Thornton Park, MD;  Location: [facility] ORS;  Service: Orthopedics;  Laterality: Left;    There were no vitals filed for this visit.      Subjective Assessment - 02/06/16 0808    Subjective Patient reports being slightly sore after last treatment session. Patient states that HEP is going well, but reports that it is harder to perform quadraped activities on bed since it isn't as firm. Also reports being able to see a difference with what she is able to do, which encourages her.     Patient is accompained by: Family member   Pertinent History personal factors affecting rehab: chronicity of left shoulder pain, fear avoidance, anxiety over injury; history  of arthritis;    How long can you sit comfortably? 5-78mn;    How long can you stand comfortably? NA   How long can you walk comfortably? NA   Diagnostic tests had MRI which showed full thickness tear prior to surgery;    Patient Stated Goals Get back to moving LUE; relieve some of pain;    Currently in Pain? Yes   Pain Score 2    Pain Location Shoulder      Treatment:   Patient performed AAROM with PVC pipe x 15 reps in all the following directions: shoulder flexion, extension, abduction, internal rotation, external rotation Instructed to watch herself in the mirror to decrease shoulder shrugging compensation.  External rotation was initiated with PVC pipe today, patient requires min-mod cueing to keep elbow close to side to allow for increased shoulder ROM.   Wall slides with towel and 4 external cues (post-it notes) 3 sets x 5 reps and assisted scapular upward rotation to allow for greater shoulder movement. Min instruction to decrease shoulder shrugging and increase shoulder ROM.  Reviewed HEP of quadraped weight shifts forwards, backwards, and side to side 2-3 minutes x 3 sets with rests.  Patient requires min cues to perform technique with good form; and to remember exercises.  MWM (mobilization with movement) performed with belt into shoulder flexion and abduction while patient was  in a seated position. 3 sets of 10 were performed.  Min cueing for smooth movement instead of bouncing.    Pre mobilization ROM   Flexion: 85 degrees  ABD: 55 degrees Post mobilization ROM  Flexion: 92 degrees  ABD: 70 degrees  Side lying ER on L UE only 2 x10 with towel at distal elbow to allow for slight shoulder ABD.  Patient requires min cueing for positioning set up and to decrease wrist extension.  Patient performed side lying shoulder flexion with scapular assistance into upward rotationg from SPT; motion seemed to improve with scapular assist; with patient reporting no c/o pain.                            PT Education - 02/06/16 0855    Education provided Yes   Education Details natural shoulder movements, benefits of mobilization and movement, and HEP   Person(s) Educated Patient   Methods Explanation;Demonstration;Verbal cues   Comprehension Verbalized understanding;Returned demonstration             PT Long Term Goals - 02/02/16 1132    PT LONG TERM GOAL #1   Title Patient will be independent in home exercise program to improve strength/mobility for better functional independence with ADLs.    Time 4   Period Weeks   Status On-going   PT LONG TERM GOAL #2   Title Patient will decrease Quick DASH score by > 8 points demonstrating reduced self-reported upper extremity disability.    Time 4   Period Weeks   Status Partially Met   PT LONG TERM GOAL #3   Title Patient will increase LUE shoulder AROM: sitting: Shoulder flexion >120 degrees, abduction: >120 degrees, supine: ER: >50 degrees, IR: >50 degrees for increased functional ROM with ADLs such as grooming, dressing etc   Time 4   Period Weeks   Status Partially Met   PT LONG TERM GOAL #4   Title Patient will increase LUE shoulder strength to >4/5 for increased functional strength with lifting groceries, doing household chores etc.    Time 4   Period Weeks   Status Partially Met   PT LONG TERM GOAL #5   Title Patient will report a worst pain of 3/10 on VAS in    LUE         to improve tolerance with ADLs and reduced symptoms with activities.    Baseline 8/10   Time 4   Period Weeks   Status Not Met               Plan - 02/06/16 0858    Clinical Impression Statement With AAROM originially, patient still compensated throughout movements but had better range with less compensation of shoulder shrugging than previous sessions, except shoulder flexion. Patient continues to lateral lean and shrug shoulder during wall slides to target with scapular assist into upward  rotation. Patient had improved ROM into shoulder flexion and ABD after mobilization with movements. Patient did report slight pain during technique.  With sidelying shoulder flexion, patient's range appeared with improve with scapular assist into upward rotation.  Patient would continue  to benefit from skilled PT to increase shoulder ROM and strength in order to return to PLOF.    Rehab Potential Poor   Clinical Impairments Affecting Rehab Potential positive: motivated, negative: co-morbidities, fear avoidance; Patient's clinical presentation is evolving as her pain is severe and she demonstrates significant fear avoidance limiting PROM;  PT Frequency 2x / week   PT Duration 4 weeks   PT Treatment/Interventions ADLs/Self Care Home Management;Cryotherapy;Electrical Stimulation;Moist Heat;Therapeutic exercise;Therapeutic activities;Functional mobility training;Ultrasound;Patient/family education;Manual techniques;Taping;Energy conservation;Dry needling   PT Next Visit Plan mobilization with movements, strengthening, progressing AAROM, progress HEP, show plantigrade instead of quadraped   PT Home Exercise Plan continue as given;    Consulted and Agree with Plan of Care Patient      Patient will benefit from skilled therapeutic intervention in order to improve the following deficits and impairments:  Decreased endurance, Obesity, Decreased knowledge of precautions, Decreased activity tolerance, Decreased strength, Impaired UE functional use, Pain, Increased muscle spasms, Decreased mobility, Decreased range of motion, Improper body mechanics, Postural dysfunction, Impaired flexibility  Visit Diagnosis: Muscle weakness (generalized)  Left shoulder pain  Stiffness of left shoulder, not elsewhere classified     Problem List Patient Active Problem List   Diagnosis Date Noted  . Pulmonary hypertension ([place]) 08/16/2012  . POTS (postural orthostatic tachycardia syndrome) 08/16/2012  . Chest pain  06/26/2012  . Shortness of breath 06/26/2012  . Rapid heart beat 06/26/2012   Tilman Neat, SPT Trotter,Margaret PT, DPT 02/06/2016, 10:57 AM  [place] MAIN [facility] SERVICES [address] [place], [place], [address] Phone: [phone]   Fax:  [phone]  Name: Claudia [name] MRN: [id number] Date of Birth: 02-Jul-1966

## 2016-02-08 ENCOUNTER — Ambulatory Visit: Payer: 59 | Admitting: Physical Therapy

## 2016-02-08 DIAGNOSIS — M6281 Muscle weakness (generalized): Secondary | ICD-10-CM | POA: Diagnosis not present

## 2016-02-08 DIAGNOSIS — M25512 Pain in left shoulder: Secondary | ICD-10-CM | POA: Diagnosis not present

## 2016-02-08 DIAGNOSIS — M25612 Stiffness of left shoulder, not elsewhere classified: Secondary | ICD-10-CM

## 2016-02-08 NOTE — Therapy (Signed)
[place] MAIN [facility] SERVICES [address] [place], [place], [address] Phone: [phone]   Fax:  [phone]  Physical Therapy Treatment  Patient Details  Name: Claudia [name] MRN: [id number] Date of Birth: Aug 26, 1966 Referring Provider: Thornton Park MD  Encounter Date: 02/08/2016      PT End of Session - 02/08/16 0904    Visit Number 21   Number of Visits 28   Date for PT Re-Evaluation 02/28/16   PT Start Time 0806   PT Stop Time 0850   PT Time Calculation (min) 44 min   Activity Tolerance Patient tolerated treatment well;No increased pain;Patient limited by pain   Behavior During Therapy [facility] for tasks assessed/performed      Past Medical History  Diagnosis Date  . Asthma     H/O YEARS AGO-NO INHALERS  . Dysrhythmia     H/O [facility] PROBLEMS SINCE 2013  . Rotator cuff tear     left  . Difficult intubation   . Arthritis     knees, back;     Past Surgical History  Procedure Laterality Date  . Carpal tunnel release      x2  . Tonsillectomy    . Colonoscopy    . Cholecystectomy    . Shoulder arthroscopy with open rotator cuff repair Left 10/31/2015    Procedure: SHOULDER ARTHROSCOPY, SUBACROMIAL DECOMPRESSION, BICEPS TENOTOMY AND MINI OPEN ROTATOR CUFF REPAIR;  Surgeon: Thornton Park, MD;  Location: [facility] ORS;  Service: Orthopedics;  Laterality: Left;    There were no vitals filed for this visit.      Subjective Assessment - 02/08/16 0809    Subjective Patient reports she has had some pain since her last session. She reports taking pain meds before coming to therapy today. She states her HEP is going well with no issues.   Pertinent History personal factors affecting rehab: chronicity of left shoulder pain, fear avoidance, anxiety over injury; history of arthritis;    How long can you sit comfortably? 5-69mn;    How long can you stand comfortably? NA   How long can you walk comfortably? NA   Diagnostic tests  had MRI which showed full thickness tear prior to surgery;    Patient Stated Goals Get back to moving LUE; relieve some of pain;    Currently in Pain? Yes   Pain Score 1    Pain Location Shoulder   Pain Orientation Left   Pain Descriptors / Indicators Aching;Sore   Pain Type Chronic pain   Pain Onset 1 to 4 weeks ago      Treatment:  AAROM:  2# bar weight x 15 reps in all the following directions: shoulder flexion, extension, abduction, IR, and ER.   Supine, passive shoulder IR attempted; patient needed multiple cues to relax; unable to do so. Instructed patient to actively move into IR and very minimal overpressure was applied at her end range; 5 minutes. Improved ROM after active ROM initiated.   Small ball rolls on wall into shoulder flexion, clockwise and counterclockwise; 1 x 10 each direction; Patient reports pain and a stretching feeling with shoulder flexion movements.   Seated hand behind back (shoulder IR); again with assist and min VCs to enhance range and reach to midline of back. Initially patient could reach side; after several attempts patient  Able to reach 2-3" on back; not to midline.   Patient requires min-mod cueing throughout exercises to decrease compensation of shoulder elevation with flexion/abduction and uses shoulder  abduction to compensate for shoulder ER   AROM: Patient instructed to perform 1 x 10 in shoulder abd and flexion before mobilization with movement; compensations still present  Scapular retractions 2 x10, scapular protractions 1 x10; requiring multiple cues for technique.   Scapular depressions and shoulder adduction with yellow resistance band (attached to door); min-mod VCs and tactile cues for sequencing, and shoulder depression  Side lying ER on L UE only 3 x 12 with towel at distal elbow to allow for slight shoulder ABD. Patient required less cueing for positioning step.    Manual Therapy: Mobilization with Movement (MWM) with manual  assist into shoulder flexion and abduction with patient in seated position. 3 sets of 10; with observed improved range  After mobilization. Patient reports mobilization feeling less awkward and notes less pain without the belt.                                    PT Education - 02/08/16 0902    Education provided Yes   Education Details posture, shoulder compensations, strengthening, exercises   Person(s) Educated Patient   Methods Explanation;Demonstration;Tactile cues   Comprehension Verbalized understanding;Verbal cues required;Tactile cues required;Returned demonstration             PT Long Term Goals - 02/02/16 1132    PT LONG TERM GOAL #1   Title Patient will be independent in home exercise program to improve strength/mobility for better functional independence with ADLs.    Time 4   Period Weeks   Status On-going   PT LONG TERM GOAL #2   Title Patient will decrease Quick DASH score by > 8 points demonstrating reduced self-reported upper extremity disability.    Time 4   Period Weeks   Status Partially Met   PT LONG TERM GOAL #3   Title Patient will increase LUE shoulder AROM: sitting: Shoulder flexion >120 degrees, abduction: >120 degrees, supine: ER: >50 degrees, IR: >50 degrees for increased functional ROM with ADLs such as grooming, dressing etc   Time 4   Period Weeks   Status Partially Met   PT LONG TERM GOAL #4   Title Patient will increase LUE shoulder strength to >4/5 for increased functional strength with lifting groceries, doing household chores etc.    Time 4   Period Weeks   Status Partially Met   PT LONG TERM GOAL #5   Title Patient will report a worst pain of 3/10 on VAS in    LUE         to improve tolerance with ADLs and reduced symptoms with activities.    Baseline 8/10   Time 4   Period Weeks   Status Not Met               Plan - 02/08/16 0904    Clinical Impression Statement Patient still requires cueing to  decrease shoulder compensations during AAROM and strengthening exercises. ROM is limited with patient reports of pain and stretching when larger movements, especially into shoulder flexion, are required. Patient needs multiple VCs and tactile cueing to understand shoulder movement pattern during scapular depressions with resisted shoulder ADD. Patient guards during passive shoulder IR until she is instructed to move into IR actively with her IR range increasing drastically. Patient would contintue to benefit from skilled PT to increase shoulder ROM and strength in order to return to ADLs without compensations.   Rehab Potential Poor  Clinical Impairments Affecting Rehab Potential positive: motivated, negative: co-morbidities, fear avoidance; Patient's clinical presentation is evolving as her pain is severe and she demonstrates significant fear avoidance limiting PROM;    PT Frequency 2x / week   PT Duration 4 weeks   PT Treatment/Interventions ADLs/Self Care Home Management;Cryotherapy;Electrical Stimulation;Moist Heat;Therapeutic exercise;Therapeutic activities;Functional mobility training;Ultrasound;Patient/family education;Manual techniques;Taping;Energy conservation;Dry needling   PT Next Visit Plan mobilization with movements, strengthening, progressing AAROM, progress HEP, show plantigrade instead of quadraped   PT Home Exercise Plan continue as given;    Consulted and Agree with Plan of Care Patient      Patient will benefit from skilled therapeutic intervention in order to improve the following deficits and impairments:  Decreased endurance, Obesity, Decreased knowledge of precautions, Decreased activity tolerance, Decreased strength, Impaired UE functional use, Pain, Increased muscle spasms, Decreased mobility, Decreased range of motion, Improper body mechanics, Postural dysfunction, Impaired flexibility  Visit Diagnosis: Muscle weakness (generalized)  Left shoulder pain  Stiffness of  left shoulder, not elsewhere classified     Problem List Patient Active Problem List   Diagnosis Date Noted  . Pulmonary hypertension ([place]) 08/16/2012  . POTS (postural orthostatic tachycardia syndrome) 08/16/2012  . Chest pain 06/26/2012  . Shortness of breath 06/26/2012  . Rapid heart beat 06/26/2012   Tilman Neat, SPT This entire session was performed under direct supervision and direction of a licensed therapist/therapist assistant . I have personally read, edited and approve of the note as written.  Trotter,Margaret PT, DPT 02/08/2016, 9:45 AM  [place] MAIN [facility] SERVICES [address] [place], [place], [address] Phone: [phone]   Fax:  [phone]  Name: KILAH DRAHOS MRN: [id number] Date of Birth: 1966/07/14

## 2016-02-13 ENCOUNTER — Ambulatory Visit: Payer: 59 | Admitting: Physical Therapy

## 2016-02-13 ENCOUNTER — Encounter: Payer: Self-pay | Admitting: Physical Therapy

## 2016-02-13 DIAGNOSIS — M25512 Pain in left shoulder: Secondary | ICD-10-CM | POA: Diagnosis not present

## 2016-02-13 DIAGNOSIS — M6281 Muscle weakness (generalized): Secondary | ICD-10-CM | POA: Diagnosis not present

## 2016-02-13 DIAGNOSIS — M25612 Stiffness of left shoulder, not elsewhere classified: Secondary | ICD-10-CM | POA: Diagnosis not present

## 2016-02-13 NOTE — Therapy (Signed)
[place] MAIN [facility] SERVICES [address] [place], [place], [address] Phone: [phone]   Fax:  [phone]  Physical Therapy Treatment  Patient Details  Name: Claudia [name] MRN: [id number] Date of Birth: Sep 23, 1965 Referring Provider: Thornton Park MD  Encounter Date: 02/13/2016      PT End of Session - 02/13/16 0903    Visit Number 22   Number of Visits 28   Date for PT Re-Evaluation 02/28/16   PT Start Time 0807   PT Stop Time 0853   PT Time Calculation (min) 46 min   Activity Tolerance Patient tolerated treatment well;Patient limited by pain   Behavior During Therapy [facility] for tasks assessed/performed      Past Medical History  Diagnosis Date  . Asthma     H/O YEARS AGO-NO INHALERS  . Dysrhythmia     H/O [facility] PROBLEMS SINCE 2013  . Rotator cuff tear     left  . Difficult intubation   . Arthritis     knees, back;     Past Surgical History  Procedure Laterality Date  . Carpal tunnel release      x2  . Tonsillectomy    . Colonoscopy    . Cholecystectomy    . Shoulder arthroscopy with open rotator cuff repair Left 10/31/2015    Procedure: SHOULDER ARTHROSCOPY, SUBACROMIAL DECOMPRESSION, BICEPS TENOTOMY AND MINI OPEN ROTATOR CUFF REPAIR;  Surgeon: Thornton Park, MD;  Location: [facility] ORS;  Service: Orthopedics;  Laterality: Left;    There were no vitals filed for this visit.      Subjective Assessment - 02/13/16 0801    Subjective Patient reports only taking tylenol (no pain meds) and driving to therapy independently today. She reports HEP is good. Also reports that is currently using ice and estim to help with pain relief.    Pertinent History personal factors affecting rehab: chronicity of left shoulder pain, fear avoidance, anxiety over injury; history of arthritis;    How long can you sit comfortably? 5-32mn;    How long can you stand comfortably? NA   How long can you walk comfortably? NA   Diagnostic  tests had MRI which showed full thickness tear prior to surgery;    Patient Stated Goals Get back to moving LUE; relieve some of pain;    Currently in Pain? Yes   Pain Score 2    Pain Location Shoulder   Pain Orientation Left   Pain Descriptors / Indicators Aching   Pain Onset 1 to 4 weeks ago       Treatment:  AAROM:  3# bar weight x 15 reps in all the following directions: shoulder flexion, extension, abduction, IR, and ER. Min cueing to decrease compenstations.  Instructed patient to actively move into IR and hold for 1-2 seconds; 2-3 minutes. Improved ROM after active ROM initiated; initially to L lateral side of body; increased by 1" medially.  Small ball rolls on wall into shoulder flexion; 1 x 10 reps; Patient reports an increase to 4/10 pain; exercise discontinued. AAROM into flexion with towel assist; 1 x 10 reps, no increase in pain.   Supine strengthening exercises with red/yellow band increase pain, discontinued due to 4/10 pain. Patient positioned in seated and instructed on shoulder strengthening with yellow theraband resistance;  2 x10 into shoulder flexion, abduction, and ER with towel moving shoulder into ABD.  D1, D2 PNF patterns in supine are painful; discontinued, in seated no complaints of pain; 1 x 10 reps.  HEP progressed to include seated shoulder flexion, abduction and ER with towel; yellow theraband resistance.  Instructed patient to perform quadruped HEP exercises in plantigrade with instructions to hold 1-2 seconds during stretching to improve shoulder ROM and min VCs required to instruct in proper body positioning.   Patient requires min-mod cueing throughout exercises to decrease compensation of shoulder elevation with flexion/abduction and uses shoulder abduction to compensate for shoulder ER   AROM: Patient instructed to perform 1 x 10 in shoulder abd and flexion, standing; compensations still present  Manual Therapy: Mobilization with Movement  (MWM) with manual assist into shoulder flexion and abduction with patient in seated position. 3 sets of 10; with observed improved range  After mobilization. Patient reports mobilization feeling less awkward and notes less pain without the belt.   ROM pre mobilization: shoulder flexion 65 degrees, shoulder abduction 60 degrees ROM post mobilization: shoulder flexion 72 degrees, shoulder abduction 58 degrees.                          PT Education - 02/13/16 0902    Education provided Yes   Education Details posture, exercises, HEP progression and modification   Person(s) Educated Patient   Methods Explanation;Demonstration;Verbal cues   Comprehension Returned demonstration;Verbalized understanding             PT Long Term Goals - 02/02/16 1132    PT LONG TERM GOAL #1   Title Patient will be independent in home exercise program to improve strength/mobility for better functional independence with ADLs.    Time 4   Period Weeks   Status On-going   PT LONG TERM GOAL #2   Title Patient will decrease Quick DASH score by > 8 points demonstrating reduced self-reported upper extremity disability.    Time 4   Period Weeks   Status Partially Met   PT LONG TERM GOAL #3   Title Patient will increase LUE shoulder AROM: sitting: Shoulder flexion >120 degrees, abduction: >120 degrees, supine: ER: >50 degrees, IR: >50 degrees for increased functional ROM with ADLs such as grooming, dressing etc   Time 4   Period Weeks   Status Partially Met   PT LONG TERM GOAL #4   Title Patient will increase LUE shoulder strength to >4/5 for increased functional strength with lifting groceries, doing household chores etc.    Time 4   Period Weeks   Status Partially Met   PT LONG TERM GOAL #5   Title Patient will report a worst pain of 3/10 on VAS in    LUE         to improve tolerance with ADLs and reduced symptoms with activities.    Baseline 8/10   Time 4   Period Weeks   Status  Not Met               Plan - 02/13/16 0903    Clinical Impression Statement Patient has limited ROM and L UE weakness with reports of increased pain with any shoulder movement. Patient is limited in performing strengthening in supine and wall ball AAROM on wall due to increase in pain, exercises discontinued.  During seated strengthening exercises, patient's reports a decrease in pain to 3/10.  Patient requierd multiple VCs and tactile cues in order to assist with appropriate exercise technique and to limit shoulder compensations. Patient reports difficulty raising arm is due to weakness and pain. Patient would continue to benefit from skilled PT intervention to increase  shoulder ROM, strength, and limit pain during ADLs.    Rehab Potential Poor   Clinical Impairments Affecting Rehab Potential positive: motivated, negative: co-morbidities, fear avoidance; Patient's clinical presentation is evolving as her pain is severe and she demonstrates significant fear avoidance limiting PROM;    PT Frequency 2x / week   PT Duration 4 weeks   PT Treatment/Interventions ADLs/Self Care Home Management;Cryotherapy;Electrical Stimulation;Moist Heat;Therapeutic exercise;Therapeutic activities;Functional mobility training;Ultrasound;Patient/family education;Manual techniques;Taping;Energy conservation;Dry needling   PT Next Visit Plan strengthening, pain control, AAROM, progress HEP   PT Home Exercise Plan continue as given;    Consulted and Agree with Plan of Care Patient      Patient will benefit from skilled therapeutic intervention in order to improve the following deficits and impairments:  Decreased endurance, Obesity, Decreased knowledge of precautions, Decreased activity tolerance, Decreased strength, Impaired UE functional use, Pain, Increased muscle spasms, Decreased mobility, Decreased range of motion, Improper body mechanics, Postural dysfunction, Impaired flexibility  Visit Diagnosis: Muscle  weakness (generalized)  Left shoulder pain  Stiffness of left shoulder, not elsewhere classified     Problem List Patient Active Problem List   Diagnosis Date Noted  . Pulmonary hypertension ([place]) 08/16/2012  . POTS (postural orthostatic tachycardia syndrome) 08/16/2012  . Chest pain 06/26/2012  . Shortness of breath 06/26/2012  . Rapid heart beat 06/26/2012   Tilman Neat, SPT This entire session was performed under direct supervision and direction of a licensed therapist/therapist assistant . I have personally read, edited and approve of the note as written.  Trotter,Margaret PT, DPT 02/13/2016, 9:45 AM  [place] MAIN [facility] SERVICES [address] [place], [place], [address] Phone: [phone]   Fax:  [phone]  Name: ROLLA SERVIDIO MRN: [id number] Date of Birth: 08-07-1966

## 2016-02-13 NOTE — Patient Instructions (Signed)
(  Clinic) Adduction: Abduction (Assist)    Left side toward pulley, arm at side. Allow pulley to assist arm lift out from side in pain free range. Repeat __10__ times per set. Do __2__ sets per session. Do __5__ sessions per week. Use yellow theraband.  Copyright  VHI. All rights reserved.  (Clinic) External Rotation: Resting Position    Opposite side toward pulley, squeeze roller to right side, elbow in, bent to 90, forearm across body. Rotate shoulder by pulling hand away from body. Keep elbow bent to 90, holding roller. Repeat _10___ times per set. Do __2__ sets per session. Do __5__ sessions per week. Use yellow theraband.  Copyright  VHI. All rights reserved.  Bent-Over Shoulder Flexion - Tubing    Sit with arms forward and down, palms in. Leaning forward from hips, pull tubing up in line with torso, thumbs turned up. Anchor under middle of both feet. Do __2_ sets of _10__ repetitions.  Copyright  VHI. All rights reserved.

## 2016-02-15 ENCOUNTER — Encounter: Payer: Self-pay | Admitting: Physical Therapy

## 2016-02-15 ENCOUNTER — Ambulatory Visit: Payer: 59 | Admitting: Physical Therapy

## 2016-02-15 DIAGNOSIS — M6281 Muscle weakness (generalized): Secondary | ICD-10-CM | POA: Diagnosis not present

## 2016-02-15 DIAGNOSIS — M25612 Stiffness of left shoulder, not elsewhere classified: Secondary | ICD-10-CM | POA: Diagnosis not present

## 2016-02-15 DIAGNOSIS — M25512 Pain in left shoulder: Secondary | ICD-10-CM

## 2016-02-15 NOTE — Therapy (Signed)
[place] MAIN [facility] SERVICES [address] [place], [place], [address] Phone: [phone]   Fax:  [phone]  Physical Therapy Treatment  Patient Details  Name: Claudia [name] MRN: [id number] Date of Birth: 02-02-1966 Referring Provider: Thornton Park MD  Encounter Date: 02/15/2016      PT End of Session - 02/15/16 0902    Visit Number 23   Number of Visits 28   Date for PT Re-Evaluation 02/28/16   PT Start Time 0806   PT Stop Time 0459   PT Time Calculation (min) 41 min   Activity Tolerance Patient tolerated treatment well;Patient limited by pain;No increased pain   Behavior During Therapy [facility] for tasks assessed/performed      Past Medical History  Diagnosis Date  . Asthma     H/O YEARS AGO-NO INHALERS  . Dysrhythmia     H/O [facility] PROBLEMS SINCE 2013  . Rotator cuff tear     left  . Difficult intubation   . Arthritis     knees, back;     Past Surgical History  Procedure Laterality Date  . Carpal tunnel release      x2  . Tonsillectomy    . Colonoscopy    . Cholecystectomy    . Shoulder arthroscopy with open rotator cuff repair Left 10/31/2015    Procedure: SHOULDER ARTHROSCOPY, SUBACROMIAL DECOMPRESSION, BICEPS TENOTOMY AND MINI OPEN ROTATOR CUFF REPAIR;  Surgeon: Thornton Park, MD;  Location: [facility] ORS;  Service: Orthopedics;  Laterality: Left;    There were no vitals filed for this visit.      Subjective Assessment - 02/15/16 0809    Subjective Patient reports trying to wait until the afternoon to take pain meds and using only tylenol since she has been driving more. She reports HEP is good. Also reports that is currently using ice and estim to help with pain relief.    Pertinent History personal factors affecting rehab: chronicity of left shoulder pain, fear avoidance, anxiety over injury; history of arthritis;    How long can you sit comfortably? 5-64mn;    How long can you stand comfortably? NA   How  long can you walk comfortably? NA   Diagnostic tests had MRI which showed full thickness tear prior to surgery;    Patient Stated Goals Get back to moving LUE; relieve some of pain;    Currently in Pain? Yes   Pain Score 1    Pain Location Shoulder   Pain Orientation Left   Pain Descriptors / Indicators Aching   Pain Type Chronic pain   Pain Onset 1 to 4 weeks ago         Treatment:  AROM was initiated in sidelying and reclined position today to attempt to minimize shoulder compensations, especially shoulder elevation.   Sidelying: Shoulder flexion; 2 x10; improved range with decreased compensations; scapular assist given at end of motion  Soft tissue mobilization to latissimus dorsi for 2-3 minutes with attempted trigger point release; patient reports tenderness allowing for only light pressure.  Scapular mobs performed into ABD/ADD, elevation/depression; 2 x 10 reps into each direction; no reports of pain/discomfort  ER with towel at elbow to allow for slight ABD; 3#, 2 x 10; multiple cues required to encourage fluid movement and instruct on technique.  Reclined: Shoulder flexion/abduction x 12; 1# weight added x 10 reps; difficulty performing ABD, cued to point thumb upwards to limit impingement, patient unable to maintain position.  Seated: To assess carry over,  shoulder AROM into flexion/abduction assessed in seated position; decreased ROM; compensation of shoulder elevation; required tactile cueing to minimize;  Slight depression given during performance with upward rotation assist from SPT.   Standing: Rhythmic stabilization against wall with hand on ball, inferior/superior, medial/lateral perturbations, and randomized perturbations at distal elbow, 4 x multiple perturbations; rest given in between sets;   Shoulder depression with added shoulder ABD/ADD with yellow theraband resistance to address shoulder elevation compensation; 2 x 10; tactile and VCs required to assist  with shoulder depression. Instructed to perform into ABD/ADD slowly in order to allow for increased shoulder stabilization challenge.    Patient required multiple min VCs and min tactile cues in order to improve technique during exercises. Patient demonstrates poor body awareness with a decreased ability to follow/retain motor commands during exercises.                        PT Education - 02/15/16 0901    Education provided Yes   Education Details normal shouler movement and stability, HEP re-instruction   Person(s) Educated Patient   Methods Explanation;Demonstration;Tactile cues;Verbal cues   Comprehension Verbalized understanding;Returned demonstration;Verbal cues required;Tactile cues required             PT Long Term Goals - 02/02/16 1132    PT LONG TERM GOAL #1   Title Patient will be independent in home exercise program to improve strength/mobility for better functional independence with ADLs.    Time 4   Period Weeks   Status On-going   PT LONG TERM GOAL #2   Title Patient will decrease Quick DASH score by > 8 points demonstrating reduced self-reported upper extremity disability.    Time 4   Period Weeks   Status Partially Met   PT LONG TERM GOAL #3   Title Patient will increase LUE shoulder AROM: sitting: Shoulder flexion >120 degrees, abduction: >120 degrees, supine: ER: >50 degrees, IR: >50 degrees for increased functional ROM with ADLs such as grooming, dressing etc   Time 4   Period Weeks   Status Partially Met   PT LONG TERM GOAL #4   Title Patient will increase LUE shoulder strength to >4/5 for increased functional strength with lifting groceries, doing household chores etc.    Time 4   Period Weeks   Status Partially Met   PT LONG TERM GOAL #5   Title Patient will report a worst pain of 3/10 on VAS in    LUE         to improve tolerance with ADLs and reduced symptoms with activities.    Baseline 8/10   Time 4   Period Weeks   Status  Not Met               Plan - 02/15/16 0902    Clinical Impression Statement Patient still exhibits limited L shoulder AROM with compensations. ROM initiated in sidelying and reclined position today to minimize shoulder compensations; patient requires multiple VCs and minimal tactile cueing to decrease shoulder elevation.  Improved active flexion in sidelying and reclined position, patient still has difficulty performing shoulder ABD and is unable to perform with shoulder in ER, even with tactile and verbal cueing. Improved response to therex and manual therapy with no increase in pain except with shoulder ABD. During all exercises, patient requires min-mod VCs to perform with appropriate technique  to improve motion. Upon palpation of L shoulder, trigger points noted with tenderness reported in latissumus dorsi  and increased tightness in upper traps.  Patient would continue to benefit from skilled PT intervention to address shoulder ROM limitations, decreased strength, and improve functional abilities.   Rehab Potential Poor   Clinical Impairments Affecting Rehab Potential positive: motivated, negative: co-morbidities, fear avoidance; Patient's clinical presentation is evolving as her pain is severe and she demonstrates significant fear avoidance limiting PROM;    PT Frequency 2x / week   PT Duration 4 weeks   PT Treatment/Interventions ADLs/Self Care Home Management;Cryotherapy;Electrical Stimulation;Moist Heat;Therapeutic exercise;Therapeutic activities;Functional mobility training;Ultrasound;Patient/family education;Manual techniques;Taping;Energy conservation;Dry needling   PT Next Visit Plan strengthening, AAROM/AROM, progress HEP, soft tissue, scapular mobiliation/stabilition.    PT Home Exercise Plan continue as given;    Consulted and Agree with Plan of Care Patient      Patient will benefit from skilled therapeutic intervention in order to improve the following deficits and  impairments:  Decreased endurance, Obesity, Decreased knowledge of precautions, Decreased activity tolerance, Decreased strength, Impaired UE functional use, Pain, Increased muscle spasms, Decreased mobility, Decreased range of motion, Improper body mechanics, Postural dysfunction, Impaired flexibility  Visit Diagnosis: Muscle weakness (generalized)  Left shoulder pain  Stiffness of left shoulder, not elsewhere classified     Problem List Patient Active Problem List   Diagnosis Date Noted  . Pulmonary hypertension ([place]) 08/16/2012  . POTS (postural orthostatic tachycardia syndrome) 08/16/2012  . Chest pain 06/26/2012  . Shortness of breath 06/26/2012  . Rapid heart beat 06/26/2012   Tilman Neat, SPT This entire session was performed under direct supervision and direction of a licensed therapist/therapist assistant . I have personally read, edited and approve of the note as written.  Trotter,Margaret PT, DPT 02/15/2016, 11:57 AM  [place] MAIN [facility] SERVICES [address] [place], [place], [address] Phone: [phone]   Fax:  [phone]  Name: Claudia [name] MRN: [id number] Date of Birth: February 02, 1966

## 2016-02-20 ENCOUNTER — Ambulatory Visit: Payer: 59 | Admitting: Physical Therapy

## 2016-02-20 ENCOUNTER — Encounter: Payer: Self-pay | Admitting: Physical Therapy

## 2016-02-20 DIAGNOSIS — M25512 Pain in left shoulder: Secondary | ICD-10-CM

## 2016-02-20 DIAGNOSIS — M25612 Stiffness of left shoulder, not elsewhere classified: Secondary | ICD-10-CM | POA: Diagnosis not present

## 2016-02-20 DIAGNOSIS — M6281 Muscle weakness (generalized): Secondary | ICD-10-CM

## 2016-02-20 NOTE — Therapy (Addendum)
[place] MAIN [facility] SERVICES [address] [place], [place], [address] Phone: [phone]   Fax:  [phone]  Physical Therapy Treatment  Patient Details  Name: Claudia [name] MRN: [id number] Date of Birth: 12-07-1965 Referring Provider: Thornton Park MD  Encounter Date: 02/20/2016      PT End of Session - 02/20/16 0920    Visit Number 24   Number of Visits 28   Date for PT Re-Evaluation 02/28/16   PT Start Time 0805   PT Stop Time 0846   PT Time Calculation (min) 41 min   Activity Tolerance Patient tolerated treatment well;Patient limited by pain;No increased pain   Behavior During Therapy [facility] for tasks assessed/performed      Past Medical History  Diagnosis Date  . Asthma     H/O YEARS AGO-NO INHALERS  . Dysrhythmia     H/O [facility] PROBLEMS SINCE 2013  . Rotator cuff tear     left  . Difficult intubation   . Arthritis     knees, back;     Past Surgical History  Procedure Laterality Date  . Carpal tunnel release      x2  . Tonsillectomy    . Colonoscopy    . Cholecystectomy    . Shoulder arthroscopy with open rotator cuff repair Left 10/31/2015    Procedure: SHOULDER ARTHROSCOPY, SUBACROMIAL DECOMPRESSION, BICEPS TENOTOMY AND MINI OPEN ROTATOR CUFF REPAIR;  Surgeon: Thornton Park, MD;  Location: [facility] ORS;  Service: Orthopedics;  Laterality: Left;    There were no vitals filed for this visit.      Subjective Assessment - 02/20/16 0849    Subjective Patient reports she is doing well with HEP and has had a good week. She states that she intermittently has some swelling in her elbow which she relieves with ice and Aleve   Pertinent History personal factors affecting rehab: chronicity of left shoulder pain, fear avoidance, anxiety over injury; history of arthritis;    How long can you sit comfortably? 5-11mn;    How long can you stand comfortably? NA   How long can you walk comfortably? NA   Diagnostic tests  had MRI which showed full thickness tear prior to surgery;    Patient Stated Goals Get back to moving LUE; relieve some of pain;    Currently in Pain? Yes   Pain Score 2    Pain Location Shoulder   Pain Orientation Left   Pain Descriptors / Indicators Sore   Pain Onset 1 to 4 weeks ago            [facility]    AROM   Left Shoulder Extension 47 Degrees   Left Shoulder Flexion 89 Degrees  114; standing    Left Shoulder ABduction 73 Degrees  79 standing   Left Shoulder Internal Rotation 41 Degrees   Left Shoulder External Rotation 44 Degrees        Treatment:   Sidelying: Shoulder flexion; 2 x15; 1# hand weight; improved range with decreased compensations; scapular assist given at end of motion  Soft tissue mobilization to latissimus dorsi for 2-3 minutes with attempted trigger point release; patient reports less tenderness upon palpation.   Scapular mobs performed into ABD/ADD, elevation/depression; 2 x 10 reps into each direction; mild improvement in motion in all directions.  Shoulder AROM re-measured in seated and standing positions.  Standing: Rhythmic stabilization against wall with hand on ball, inferior/superior, medial/lateral perturbations, and randomized perturbations at  just distal to elbow to increase difficulty, 2 x multiple perturbations; rest given in between sets;   Shoulder strengthening; Yellow tband resistance stabilized in door; 2 x 10 each direction, LUE only Shoulder depression with added shoulder ABD/ADD to address shoulder elevation compensation; 2 x 10; improved quality requiring less tactile and VCs to maintain shoulder depression. Shoulder extension, required cueing to maintain body position to decrease trunk rotation and target shoulder muscle. Shoulder ER with towel into slight shoulder ABD, VCs required to decrease range performed to  Shoulder flexion; mild shoulder elevation compensation (improved from previous  sessions); instructed to count out loud due to patient holding her breath   Seated shoulder flexion 1# AROM with tactile cues to increase flexion ROM x15 reps with LUE only;  Patient required multiple min-mod VCs and min tactile cues in order to improve technique during exercises. Patient demonstrates decreased compensations with shoulder strengthening exercises; reports no pain throughout movements.                      PT Education - 02/20/16 0851    Education provided Yes   Education Details shoulder compensations,    Person(s) Educated Patient   Methods Explanation;Demonstration   Comprehension Verbal cues required;Verbalized understanding;Returned demonstration             PT Long Term Goals - 02/02/16 1132    PT LONG TERM GOAL #1   Title Patient will be independent in home exercise program to improve strength/mobility for better functional independence with ADLs.    Time 4   Period Weeks   Status On-going   PT LONG TERM GOAL #2   Title Patient will decrease Quick DASH score by > 8 points demonstrating reduced self-reported upper extremity disability.    Time 4   Period Weeks   Status Partially Met   PT LONG TERM GOAL #3   Title Patient will increase LUE shoulder AROM: sitting: Shoulder flexion >120 degrees, abduction: >120 degrees, supine: ER: >50 degrees, IR: >50 degrees for increased functional ROM with ADLs such as grooming, dressing etc   Time 4   Period Weeks   Status Partially Met   PT LONG TERM GOAL #4   Title Patient will increase LUE shoulder strength to >4/5 for increased functional strength with lifting groceries, doing household chores etc.    Time 4   Period Weeks   Status Partially Met   PT LONG TERM GOAL #5   Title Patient will report a worst pain of 3/10 on VAS in    LUE         to improve tolerance with ADLs and reduced symptoms with activities.    Baseline 8/10   Time 4   Period Weeks   Status Not Met                Plan - 02/20/16 2094    Clinical Impression Statement Patient still has limited L shoulder AROM with compensations; however throughout treatment shoulder compensations appear to have improved with decreased shoulder elevation and improved body awareness throughout movement. Patient does continue to required min-mod VCs to perform exercises with decreased compensations and appropriate form. Patient continues to have trigger points in latissimus dorsi muscle with tenderness to touch. Patient would continue to benefit from  skilled PT intervention in order to address shoulder ROM, strength, and improve functional abilities.    Rehab Potential Poor   Clinical Impairments Affecting Rehab Potential positive: motivated, negative: co-morbidities, fear avoidance; Patient's  clinical presentation is evolving as her pain is severe and she demonstrates significant fear avoidance limiting PROM;    PT Frequency 2x / week   PT Duration 4 weeks   PT Treatment/Interventions ADLs/Self Care Home Management;Cryotherapy;Electrical Stimulation;Moist Heat;Therapeutic exercise;Therapeutic activities;Functional mobility training;Ultrasound;Patient/family education;Manual techniques;Taping;Energy conservation;Dry needling   PT Next Visit Plan strengthening, AAROM/AROM, progress HEP, soft tissue, scapular mobilization/stabilition.    PT Home Exercise Plan continue as given;    Consulted and Agree with Plan of Care Patient      Patient will benefit from skilled therapeutic intervention in order to improve the following deficits and impairments:  Decreased endurance, Obesity, Decreased knowledge of precautions, Decreased activity tolerance, Decreased strength, Impaired UE functional use, Pain, Increased muscle spasms, Decreased mobility, Decreased range of motion, Improper body mechanics, Postural dysfunction, Impaired flexibility  Visit Diagnosis: Muscle weakness (generalized)  Left shoulder pain  Stiffness of left  shoulder, not elsewhere classified     Problem List Patient Active Problem List   Diagnosis Date Noted  . Pulmonary hypertension ([place]) 08/16/2012  . POTS (postural orthostatic tachycardia syndrome) 08/16/2012  . Chest pain 06/26/2012  . Shortness of breath 06/26/2012  . Rapid heart beat 06/26/2012   Tilman Neat, SPT This entire session was performed under direct supervision and direction of a licensed therapist/therapist assistant . I have personally read, edited and approve of the note as written.  Trotter,Margaret PT, DPT 02/20/2016, 11:11 AM  [place] MAIN [facility] SERVICES [address] [place], [place], [address] Phone: [phone]   Fax:  [phone]  Name: Claudia [name] MRN: [id number] Date of Birth: Jul 04, 1966

## 2016-02-22 ENCOUNTER — Ambulatory Visit: Payer: 59 | Admitting: Physical Therapy

## 2016-02-22 ENCOUNTER — Encounter: Payer: Self-pay | Admitting: Physical Therapy

## 2016-02-22 DIAGNOSIS — M25612 Stiffness of left shoulder, not elsewhere classified: Secondary | ICD-10-CM

## 2016-02-22 DIAGNOSIS — M25512 Pain in left shoulder: Secondary | ICD-10-CM | POA: Diagnosis not present

## 2016-02-22 DIAGNOSIS — M6281 Muscle weakness (generalized): Secondary | ICD-10-CM

## 2016-02-22 NOTE — Therapy (Signed)
[place] MAIN [facility] SERVICES [address] [place], [place], [address] Phone: [phone]   Fax:  [phone]  Physical Therapy Treatment  Patient Details  Name: Claudia [name] MRN: [id number] Date of Birth: 1965-09-11 Referring Provider: Thornton Park MD  Encounter Date: 02/22/2016      PT End of Session - 02/22/16 0852    Visit Number 25   Number of Visits 28   Date for PT Re-Evaluation 02/28/16   PT Start Time 0802   PT Stop Time 5277   PT Time Calculation (min) 45 min   Activity Tolerance Patient tolerated treatment well;Patient limited by pain   Behavior During Therapy [facility] for tasks assessed/performed      Past Medical History  Diagnosis Date  . Asthma     H/O YEARS AGO-NO INHALERS  . Dysrhythmia     H/O [facility] PROBLEMS SINCE 2013  . Rotator cuff tear     left  . Difficult intubation   . Arthritis     knees, back;     Past Surgical History  Procedure Laterality Date  . Carpal tunnel release      x2  . Tonsillectomy    . Colonoscopy    . Cholecystectomy    . Shoulder arthroscopy with open rotator cuff repair Left 10/31/2015    Procedure: SHOULDER ARTHROSCOPY, SUBACROMIAL DECOMPRESSION, BICEPS TENOTOMY AND MINI OPEN ROTATOR CUFF REPAIR;  Surgeon: Thornton Park, MD;  Location: [facility] ORS;  Service: Orthopedics;  Laterality: Left;    There were no vitals filed for this visit.      Subjective Assessment - 02/22/16 0803    Subjective Patient reports she was having swelling, which she used ice to treat. She was able to get swelling to go down but reports stiffness afterwards. HEP is going well.    Pertinent History personal factors affecting rehab: chronicity of left shoulder pain, fear avoidance, anxiety over injury; history of arthritis;    How long can you sit comfortably? 5-26mn;    How long can you stand comfortably? NA   How long can you walk comfortably? NA   Diagnostic tests had MRI which showed full  thickness tear prior to surgery;    Patient Stated Goals Get back to moving LUE; relieve some of pain;    Currently in Pain? Yes   Pain Score 1    Pain Location Shoulder   Pain Orientation Left   Pain Descriptors / Indicators Sore   Pain Onset 1 to 4 weeks ago     Treatment: Warm up: Seated arm bike; BUE; backwards and forwards; x 5 min. (unbilled)  Sidelying:  Arm flexion with 1# weight 2x15; scapular upward rotation assist at end of range, decreased range compared to last week, cued to perform with smooth, controlled movements  Soft tissue mobilization of latissimus dorsi, x3 min. Decreased tenderness compared to last session; still has trigger points.  Scapular mobs into elevation, depression, retraction, protraction; x 4 min, improved mobility throughout range, no pain  Instructed patient in Qped, lats stretch, with hands shifted towards left and right to increase stretch in each latissimus dorsi muscle. 2 x 15 seconds each side  Seated; UE punches with 1# weight, decreased movement compared to last session; cued to perform through greater motion; 2 x 12; attempted 2# weight, patient unable to perform    Standing: Y on the wall; 2 x 10; instructed to perform with thumb facing her for improved ER  AAROM, wall ball counterclockwise and  clockwise circles, 2 x 10;  Dynamic stabilization; small Pball; 10-12 perturbations to distal elbow with multiple cues to stabilize arm.  Shoulder strengthening; Yellow tband resistance stabilized in door; 2 x 10 each direction, LUE only Shoulder depression with added shoulder ABD/ADD to address shoulder elevation compensation; 2 x 10; requires tactile and VCs to maintain shoulder depression. Shoulder extension, required cueing to maintain body position to decrease trunk rotation and target shoulder muscle. Shoulder ER with towel into slight shoulder ABD, VCs to not perform with bouncing in order to maintain good technique and proper muscle  strengthening. Shoulder flexion; mild shoulder elevation compensation; with tactile cueing, patient's ability to decrease compensation improved. Added D1 shoulder pattern with yellow tband, instructed to perform throughout bigger range in order to move shoulder overhead, difficulty performing into diagonal pattern.   Throughout session, patient required min-mod VCs and min tactile cueing in order to decrease shoulder compensations.                            PT Education - 02/22/16 0851    Education provided Yes   Education Details swelling management, exercise technique, positive effects of movement   Person(s) Educated Patient   Methods Explanation;Tactile cues;Verbal cues   Comprehension Verbalized understanding;Returned demonstration;Tactile cues required             PT Long Term Goals - 02/02/16 1132    PT LONG TERM GOAL #1   Title Patient will be independent in home exercise program to improve strength/mobility for better functional independence with ADLs.    Time 4   Period Weeks   Status On-going   PT LONG TERM GOAL #2   Title Patient will decrease Quick DASH score by > 8 points demonstrating reduced self-reported upper extremity disability.    Time 4   Period Weeks   Status Partially Met   PT LONG TERM GOAL #3   Title Patient will increase LUE shoulder AROM: sitting: Shoulder flexion >120 degrees, abduction: >120 degrees, supine: ER: >50 degrees, IR: >50 degrees for increased functional ROM with ADLs such as grooming, dressing etc   Time 4   Period Weeks   Status Partially Met   PT LONG TERM GOAL #4   Title Patient will increase LUE shoulder strength to >4/5 for increased functional strength with lifting groceries, doing household chores etc.    Time 4   Period Weeks   Status Partially Met   PT LONG TERM GOAL #5   Title Patient will report a worst pain of 3/10 on VAS in    LUE         to improve tolerance with ADLs and reduced symptoms with  activities.    Baseline 8/10   Time 4   Period Weeks   Status Not Met               Plan - 02/22/16 0853    Clinical Impression Statement Patient continues to exhibit decreased L shoulder AROM with an increase in pain throughout movement and noted shoulder compensations when moving past 90 degrees.  Patient requires min-mod verbal/tactile cues in order to decrease shoulder compensations with every exercise. Patient has an increase in "burning" pain when moving arm about 90 degrees. Patient continues to have minimal swelling in elbow, reports she is able to control with ice and Aleve. Patient would continue to benefit from skilled PT in order to increase ROM, decrease pain, and improve performance of ADLs  with less pain.   Rehab Potential Poor   Clinical Impairments Affecting Rehab Potential positive: motivated, negative: co-morbidities, fear avoidance; Patient's clinical presentation is evolving as her pain is severe and she demonstrates significant fear avoidance limiting PROM;    PT Frequency 2x / week   PT Duration 4 weeks   PT Treatment/Interventions ADLs/Self Care Home Management;Cryotherapy;Electrical Stimulation;Moist Heat;Therapeutic exercise;Therapeutic activities;Functional mobility training;Ultrasound;Patient/family education;Manual techniques;Taping;Energy conservation;Dry needling   PT Next Visit Plan strengthening, AAROM/AROM, progress HEP, soft tissue, scapular mobilization/stabilition.    PT Home Exercise Plan continue as given;    Consulted and Agree with Plan of Care Patient      Patient will benefit from skilled therapeutic intervention in order to improve the following deficits and impairments:  Decreased endurance, Obesity, Decreased knowledge of precautions, Decreased activity tolerance, Decreased strength, Impaired UE functional use, Pain, Increased muscle spasms, Decreased mobility, Decreased range of motion, Improper body mechanics, Postural dysfunction, Impaired  flexibility  Visit Diagnosis: Muscle weakness (generalized)  Left shoulder pain  Stiffness of left shoulder, not elsewhere classified     Problem List Patient Active Problem List   Diagnosis Date Noted  . Pulmonary hypertension ([place]) 08/16/2012  . POTS (postural orthostatic tachycardia syndrome) 08/16/2012  . Chest pain 06/26/2012  . Shortness of breath 06/26/2012  . Rapid heart beat 06/26/2012   Tilman Neat, SPT This entire session was performed under direct supervision and direction of a licensed therapist/therapist assistant . I have personally read, edited and approve of the note as written.  Trotter,Margaret PT, DPT 02/22/2016, 10:22 AM  [place] MAIN [facility] SERVICES [address] [place], [place], [address] Phone: [phone]   Fax:  [phone]  Name: Claudia [name] MRN: [id number] Date of Birth: 1966/04/19

## 2016-02-28 ENCOUNTER — Ambulatory Visit: Payer: 59 | Attending: Orthopedic Surgery | Admitting: Physical Therapy

## 2016-02-28 ENCOUNTER — Encounter: Payer: Self-pay | Admitting: Physical Therapy

## 2016-02-28 DIAGNOSIS — M25512 Pain in left shoulder: Secondary | ICD-10-CM | POA: Diagnosis not present

## 2016-02-28 DIAGNOSIS — M6281 Muscle weakness (generalized): Secondary | ICD-10-CM | POA: Diagnosis not present

## 2016-02-28 DIAGNOSIS — M25612 Stiffness of left shoulder, not elsewhere classified: Secondary | ICD-10-CM | POA: Diagnosis not present

## 2016-02-28 DIAGNOSIS — R29898 Other symptoms and signs involving the musculoskeletal system: Secondary | ICD-10-CM | POA: Diagnosis not present

## 2016-02-28 NOTE — Therapy (Signed)
[place] MAIN [facility] SERVICES [address] [place], [place], [address] Phone: [phone]   Fax:  [phone]  Physical Therapy Treatment  Patient Details  Name: Claudia [name] MRN: [id number] Date of Birth: Jun 05, 1966 Referring Provider: Thornton Park MD  Encounter Date: 02/28/2016      PT End of Session - 02/28/16 0855    Visit Number 26   Number of Visits 36   Date for PT Re-Evaluation 03/27/16   PT Start Time 0805   PT Stop Time 0846   PT Time Calculation (min) 41 min   Activity Tolerance Patient tolerated treatment well;Patient limited by pain   Behavior During Therapy [facility] for tasks assessed/performed      Past Medical History  Diagnosis Date  . Asthma     H/O YEARS AGO-NO INHALERS  . Dysrhythmia     H/O [facility] PROBLEMS SINCE 2013  . Rotator cuff tear     left  . Difficult intubation   . Arthritis     knees, back;     Past Surgical History  Procedure Laterality Date  . Carpal tunnel release      x2  . Tonsillectomy    . Colonoscopy    . Cholecystectomy    . Shoulder arthroscopy with open rotator cuff repair Left 10/31/2015    Procedure: SHOULDER ARTHROSCOPY, SUBACROMIAL DECOMPRESSION, BICEPS TENOTOMY AND MINI OPEN ROTATOR CUFF REPAIR;  Surgeon: Thornton Park, MD;  Location: [facility] ORS;  Service: Orthopedics;  Laterality: Left;    There were no vitals filed for this visit.      Subjective Assessment - 02/28/16 0853    Subjective Patient reports she was not able to do any HEP due to being busy with the holiday. She reports she still uses ice and stimulator to control pain.   Pertinent History personal factors affecting rehab: chronicity of left shoulder pain, fear avoidance, anxiety over injury; history of arthritis;    How long can you sit comfortably? 5-66mn;    How long can you stand comfortably? NA   How long can you walk comfortably? NA   Diagnostic tests had MRI which showed full thickness tear prior  to surgery;    Patient Stated Goals Get back to moving LUE; relieve some of pain;    Pain Score 3    Pain Location Shoulder   Pain Orientation Left   Pain Descriptors / Indicators Sore   Pain Onset 1 to 4 weeks ago      Treatment:   Sidelying:  Arm flexion with 2# weight 2x10; LUE only; scapular upward rotation assist at end of range, decreased range initially improved with movement.  Arm abduction; 2 x12; LUE only scapular upward rotation assist at end of range; patient has decreased compensation and decreased reports of pain in this position.  Soft tissue mobilization of latissimus dorsi and upper trap, x4 min. Patient continues to have tightness and tenderness to palpation.   Scapular mobs into elevation, depression, retraction, protraction; x 2 min, no pain with passive movements.  Supine: Shoulder protractions; 1# weight in LUE; BUE used in this exercise; required cueing in order to perform through correct range allowing for focus on shoulder stabilization.  Seated; L upper trap stretch to decrease tenderness and tightness; instructed to perform with correct form and into a mild stretch but no pain. Multiple cues required to perform correctly.  Standing: ITYs; 2 x 10; shoulder compensation evident in the I and Y portion. Patient instructed to perform in  front of the mirror to allow her to see shoulder compensation. VCs required to perform Y correctly; compensations improved with cues to control scapula Y on the wall; 2 x 10; instructed to perform with decrease lateral flexion to the R.  AAROM, wall ball up/down and clockwise circles, x 10; counterclockwise circles caused pain therefore d/c. Multiple instructions to decrease lateral trunk lean.   Dynamic stabilization; small Pball; 3x 10-12 perturbations just distal elbow with multiple cues to stabilize with shoulder at 90 degrees.  Shoulder strengthening; increased to red tband; LUE only Shoulder depression with added  shoulder ABD/ADD to address shoulder elevation compensation; 2 x 10; requires VCs to maintain scapular stability Shoulder extension, x10;  Shoulder ER with towel into slight shoulder ABD, x10 VCs to maintain good technique with no shoulder ABD Shoulder flexion; x10 mild shoulder elevation compensation; instructed to perform in range with no upper trap compensation occurring  Seated tband strengthening discontinued and standing tband strengthening initiated due to decreased compensation, decreased pain, and increased scapular control.    Throughout session, patient required min-mod VCs and min tactile cueing in order to decrease shoulder compensations.                           PT Education - 02/28/16 0854    Education provided Yes   Education Details HEP progression   Person(s) Educated Patient   Methods Explanation;Demonstration;Verbal cues   Comprehension Verbalized understanding;Verbal cues required;Returned demonstration             PT Long Term Goals - 02/02/16 1132    PT LONG TERM GOAL #1   Title Patient will be independent in home exercise program to improve strength/mobility for better functional independence with ADLs.    Time 4   Period Weeks   Status On-going   PT LONG TERM GOAL #2   Title Patient will decrease Quick DASH score by > 8 points demonstrating reduced self-reported upper extremity disability.    Time 4   Period Weeks   Status Partially Met   PT LONG TERM GOAL #3   Title Patient will increase LUE shoulder AROM: sitting: Shoulder flexion >120 degrees, abduction: >120 degrees, supine: ER: >50 degrees, IR: >50 degrees for increased functional ROM with ADLs such as grooming, dressing etc   Time 4   Period Weeks   Status Partially Met   PT LONG TERM GOAL #4   Title Patient will increase LUE shoulder strength to >4/5 for increased functional strength with lifting groceries, doing household chores etc.    Time 4   Period Weeks   Status  Partially Met   PT LONG TERM GOAL #5   Title Patient will report a worst pain of 3/10 on VAS in    LUE         to improve tolerance with ADLs and reduced symptoms with activities.    Baseline 8/10   Time 4   Period Weeks   Status Not Met               Plan - 02/28/16 0858    Clinical Impression Statement Patient has decreased L shoulder AROM with increased compensation with movements greater than 90 degrees. Patient requires min-mod VCs to decrease shoulder compensation, improve scapular control, and perform exercises correctly. Patient continues to have mild increase in pain with movements. Patient would continue to benefit from skilled PT in order to increase ROM, decrease pain, and return patient to independence  of ADL management.   Rehab Potential Poor   Clinical Impairments Affecting Rehab Potential positive: motivated, negative: co-morbidities, fear avoidance; Patient's clinical presentation is evolving as her pain is severe and she demonstrates significant fear avoidance limiting PROM;    PT Frequency 2x / week   PT Duration 4 weeks   PT Treatment/Interventions ADLs/Self Care Home Management;Cryotherapy;Electrical Stimulation;Moist Heat;Therapeutic exercise;Therapeutic activities;Functional mobility training;Ultrasound;Patient/family education;Manual techniques;Taping;Energy conservation;Dry needling   PT Next Visit Plan strengthening, AAROM/AROM, progress HEP, soft tissue, scapular mobilization/stabilition.    PT Home Exercise Plan continue as given;    Consulted and Agree with Plan of Care Patient      Patient will benefit from skilled therapeutic intervention in order to improve the following deficits and impairments:  Decreased endurance, Obesity, Decreased knowledge of precautions, Decreased activity tolerance, Decreased strength, Impaired UE functional use, Pain, Increased muscle spasms, Decreased mobility, Decreased range of motion, Improper body mechanics, Postural  dysfunction, Impaired flexibility  Visit Diagnosis: Muscle weakness (generalized) - Plan: PT plan of care cert/re-cert  Left shoulder pain - Plan: PT plan of care cert/re-cert  Stiffness of left shoulder, not elsewhere classified - Plan: PT plan of care cert/re-cert     Problem List Patient Active Problem List   Diagnosis Date Noted  . Pulmonary hypertension ([place]) 08/16/2012  . POTS (postural orthostatic tachycardia syndrome) 08/16/2012  . Chest pain 06/26/2012  . Shortness of breath 06/26/2012  . Rapid heart beat 06/26/2012   Tilman Neat, SPT This entire session was performed under direct supervision and direction of a licensed therapist/therapist assistant . I have personally read, edited and approve of the note as written.  Trotter,Margaret PT, DPT 02/28/2016, 9:25 AM  [place] MAIN [facility] SERVICES [address] [place], [place], [address] Phone: [phone]   Fax:  [phone]  Name: Claudia [name] MRN: [id number] Date of Birth: 01-29-1966

## 2016-02-28 NOTE — Patient Instructions (Signed)
Flexibility: Upper Trapezius Stretch    Gently grasp right side of head while reaching behind back with other hand. Tilt head away until a gentle stretch is felt. Hold __15__ seconds. Repeat __2__ times per set. Do __1__ sets per session. Do _1___ sessions per day.  http://orth.exer.us/341   Copyright  VHI. All rights reserved.  Strengthening: Resisted Abduction    Hold tubing with right arm across body. Pull up and away from side. Move through pain-free range of motion. Repeat _10___ times per set. Do __2__ sets per session. Do __1__ sessions per day.  http://orth.exer.us/827   Copyright  VHI. All rights reserved.  Resisted Shoulder Flexion: Bilateral    Face anchor, tubing end in each hand. With arms straight out in front, pinch shoulder blades together and raise arms over head. Repeat __2__ times per set. Do __10__ sets per session. Do __1__ sessions per day.  http://orth.exer.us/965   Copyright  VHI. All rights reserved.  Strengthening: Resisted Extension    Hold tubing in right hand, arm forward. Pull arm back, elbow straight. Repeat __10__ times per set. Do __2__ sets per session. Do __1__ sessions per day.  http://orth.exer.us/833   Copyright  VHI. All rights reserved.  Strengthening: Resisted External Rotation    Hold tubing in right hand, elbow at side and forearm across body. Rotate forearm out. Repeat __2__ times per set. Do _10___ sets per session. Do __1__ sessions per day.  http://orth.exer.us/829   Copyright  VHI. All rights reserved.

## 2016-03-01 ENCOUNTER — Encounter: Payer: Self-pay | Admitting: Physical Therapy

## 2016-03-01 ENCOUNTER — Ambulatory Visit: Payer: 59 | Admitting: Physical Therapy

## 2016-03-01 DIAGNOSIS — M25512 Pain in left shoulder: Secondary | ICD-10-CM

## 2016-03-01 DIAGNOSIS — M25612 Stiffness of left shoulder, not elsewhere classified: Secondary | ICD-10-CM | POA: Diagnosis not present

## 2016-03-01 DIAGNOSIS — R29898 Other symptoms and signs involving the musculoskeletal system: Secondary | ICD-10-CM | POA: Diagnosis not present

## 2016-03-01 DIAGNOSIS — M6281 Muscle weakness (generalized): Secondary | ICD-10-CM | POA: Diagnosis not present

## 2016-03-01 NOTE — Therapy (Signed)
[place] MAIN [facility] SERVICES [address] [place], [place], [address] Phone: [phone]   Fax:  [phone]  Physical Therapy Treatment/Progress Note  Patient Details  Name: Claudia [name] MRN: [id number] Date of Birth: 02-Apr-1966 Referring Provider: Thornton Park MD  Encounter Date: 03/01/2016      PT End of Session - 03/01/16 0952    Visit Number 27   Number of Visits 36   Date for PT Re-Evaluation 03/27/16   PT Start Time 0808   PT Stop Time 0850   PT Time Calculation (min) 42 min   Activity Tolerance Patient tolerated treatment well;Patient limited by pain   Behavior During Therapy [facility] for tasks assessed/performed      Past Medical History  Diagnosis Date  . Asthma     H/O YEARS AGO-NO INHALERS  . Dysrhythmia     H/O [facility] PROBLEMS SINCE 2013  . Rotator cuff tear     left  . Difficult intubation   . Arthritis     knees, back;     Past Surgical History  Procedure Laterality Date  . Carpal tunnel release      x2  . Tonsillectomy    . Colonoscopy    . Cholecystectomy    . Shoulder arthroscopy with open rotator cuff repair Left 10/31/2015    Procedure: SHOULDER ARTHROSCOPY, SUBACROMIAL DECOMPRESSION, BICEPS TENOTOMY AND MINI OPEN ROTATOR CUFF REPAIR;  Surgeon: Thornton Park, MD;  Location: [facility] ORS;  Service: Orthopedics;  Laterality: Left;    There were no vitals filed for this visit.      Subjective Assessment - 03/01/16 0810    Subjective Patient reports she has had some difficulty sleeping due to pain. Patient reports she is using ice frequently to relieve pain. Patient reports having tenderness in pressure points in latissimus dorsi and tightness in her upper traps have been bothering her.  Next MD visist 7/20, patient notes that if she does not show improvement  the MD has mentioned ordering an MRI.   Pertinent History personal factors affecting rehab: chronicity of left shoulder pain, fear avoidance,  anxiety over injury; history of arthritis;    How long can you sit comfortably? 5-13mn;    How long can you stand comfortably? NA   How long can you walk comfortably? NA   Diagnostic tests had MRI which showed full thickness tear prior to surgery;    Patient Stated Goals Get back to moving LUE; relieve some of pain;    Currently in Pain? Yes   Pain Score 1    Pain Location Shoulder   Pain Orientation Left   Pain Descriptors / Indicators Sore   Pain Onset 1 to 4 weeks ago            [facility]    Observation/Other Assessments   Quick DASH  65.9%, higher the score the greater the disability, improved for initial at 88.6% and from last assessment 6/7 from 68%   AROM   Left Shoulder Extension 35 Degrees   Left Shoulder Flexion 96 Degrees   Left Shoulder ABduction 65 Degrees   Left Shoulder Internal Rotation 30 Degrees  Has greater IR, stopped to achieve only GH ROM   Left Shoulder External Rotation 22 Degrees   Strength   Overall Strength Comments Patient has limited LUE strength due to pain with very little resistance, difficulty in assessing strength. However she is unable to achieve full ROM. [facility]  Treatment:  Soft tissue mobilization of latissimus dorsi and upper traps with very little pressure applied to upper traps; ~5 min.  Patient reports performing latissimus dorsi and upper traps stretch. Re-instructed on upper traps stretch to perform with appropriate technique to increase stretch.   Sidelying shoulder flexion, 2# weight; 2 x 12 with rest in between; scapular upward assist at end range; instructed to perform into greater ROM. Sidelying shoulder abduction, no weight, 2 x 12, rest break; patient reports no pain today and has improved range with decreased shoulder compensation.  Supine shoulder plus presses with 1# weight, BUE; 2 x 10; instructed to perform in decreased range for improved form.  Standing scapular  retractions with elbows extended, 2 x 10, instructed to squeeze shoulder blades together to increase range.  AAROM with green pball on wall into forward flexion, counterclockwise and clockwise circles x 10 reps.  UE Y movements on the wall, reports stretching, able to perform into greater shoulder flexion with mildly decreased lateral lean. Patient still requires VCs to decrease shoulder compensation.                         PT Education - 03/01/16 0951    Education provided Yes   Education Details shoulder healing process   Person(s) Educated Patient   Methods Explanation;Verbal cues   Comprehension Verbalized understanding             PT Long Term Goals - 03/01/16 0955    PT LONG TERM GOAL #1   Title Patient will be independent in home exercise program to improve strength/mobility for better functional independence with ADLs.    Time 4   Period Weeks   Status On-going   PT LONG TERM GOAL #2   Title Patient will decrease Quick DASH score by > 8 points demonstrating reduced self-reported upper extremity disability.    Baseline From initial eval to now patient has improved from 88% to 65.9%   Time 4   Period Weeks   Status Achieved   PT LONG TERM GOAL #3   Title Patient will increase LUE shoulder AROM: sitting: Shoulder flexion >120 degrees, abduction: >120 degrees, supine: ER: >50 degrees, IR: >50 degrees for increased functional ROM with ADLs such as grooming, dressing etc   Time 4   Period Weeks   Status Not Met   PT LONG TERM GOAL #4   Title Patient will increase LUE shoulder strength to >4/5 for increased functional strength with lifting groceries, doing household chores etc.    Time 4   Period Weeks   Status Not Met   PT LONG TERM GOAL #5   Title Patient will report a worst pain of 3/10 on VAS in    LUE         to improve tolerance with ADLs and reduced symptoms with activities.    Baseline 8/10   Time 4   Period Weeks   Status Partially Met                Plan - 03/01/16 0953    Clinical Impression Statement Today patient's ROM, muscle strength, and Quick dash were reassessed. Patient showed varied results with ROM and was limited by pain when performing seated flexion and abduction, and supine ER at 90 degrees. True MMT was not able to be performed on LUE due to limited range and increase in pain.  Patient met her Katina Dung goal, but has not met other goals and  is progressing very slowly. Patient performed AAROM activities with decreased complaints of pain today. With soft tissue mobilization, patient has similar reports of pain; she is very tender to touch in upper trap. Patient re-educated on upper trap stretch to decrease tissue tightness. Educated patient on long term healing and difficulty in regaining range since patient is so far out of surgery; instructed that we would see patient until MD visit on 7/20 to address pain, ROM, and strength. Patient was also informed that based on what MRI results and doctors instructions therapy will be discontinued or resumed.    Rehab Potential Poor   Clinical Impairments Affecting Rehab Potential positive: motivated, negative: co-morbidities, fear avoidance; Patient's clinical presentation is evolving as her pain is severe and she demonstrates significant fear avoidance limiting PROM;    PT Frequency 2x / week   PT Duration 4 weeks   PT Treatment/Interventions ADLs/Self Care Home Management;Cryotherapy;Electrical Stimulation;Moist Heat;Therapeutic exercise;Therapeutic activities;Functional mobility training;Ultrasound;Patient/family education;Manual techniques;Taping;Energy conservation;Dry needling   PT Next Visit Plan strengthening, AAROM/AROM, progress HEP, soft tissue, scapular mobilization/stabilition.    PT Home Exercise Plan continue as given;    Consulted and Agree with Plan of Care Patient      Patient will benefit from skilled therapeutic intervention in order to improve the  following deficits and impairments:  Decreased endurance, Obesity, Decreased knowledge of precautions, Decreased activity tolerance, Decreased strength, Impaired UE functional use, Pain, Increased muscle spasms, Decreased mobility, Decreased range of motion, Improper body mechanics, Postural dysfunction, Impaired flexibility  Visit Diagnosis: Muscle weakness (generalized)  Left shoulder pain  Stiffness of left shoulder, not elsewhere classified     Problem List Patient Active Problem List   Diagnosis Date Noted  . Pulmonary hypertension ([place]) 08/16/2012  . POTS (postural orthostatic tachycardia syndrome) 08/16/2012  . Chest pain 06/26/2012  . Shortness of breath 06/26/2012  . Rapid heart beat 06/26/2012   Tilman Neat, SPT This entire session was performed under direct supervision and direction of a licensed therapist/therapist assistant . I have personally read, edited and approve of the note as written.  Trotter,Margaret PT, DPT 03/01/2016, 10:25 AM  [place] MAIN [facility] SERVICES [address] [place], [place], [address] Phone: [phone]   Fax:  [phone]  Name: CANYON WILLOW MRN: [id number] Date of Birth: 1965-08-27

## 2016-03-05 ENCOUNTER — Ambulatory Visit: Payer: 59 | Admitting: Physical Therapy

## 2016-03-05 ENCOUNTER — Encounter: Payer: Self-pay | Admitting: Physical Therapy

## 2016-03-05 DIAGNOSIS — M25512 Pain in left shoulder: Secondary | ICD-10-CM | POA: Diagnosis not present

## 2016-03-05 DIAGNOSIS — M25612 Stiffness of left shoulder, not elsewhere classified: Secondary | ICD-10-CM

## 2016-03-05 DIAGNOSIS — M6281 Muscle weakness (generalized): Secondary | ICD-10-CM

## 2016-03-05 DIAGNOSIS — R29898 Other symptoms and signs involving the musculoskeletal system: Secondary | ICD-10-CM | POA: Diagnosis not present

## 2016-03-05 NOTE — Therapy (Signed)
[place] MAIN [facility] SERVICES [address] [place], [place], [address] Phone: [phone]   Fax:  [phone]  Physical Therapy Treatment  Patient Details  Name: Claudia [name] MRN: [id number] Date of Birth: 1966/07/19 Referring Provider: Thornton Park MD  Encounter Date: 03/05/2016      PT End of Session - 03/05/16 0848    Visit Number 28   Number of Visits 36   Date for PT Re-Evaluation 03/27/16   PT Start Time 0804   PT Stop Time 0846   PT Time Calculation (min) 42 min   Activity Tolerance Patient tolerated treatment well;Patient limited by pain   Behavior During Therapy [facility] for tasks assessed/performed      Past Medical History  Diagnosis Date  . Asthma     H/O YEARS AGO-NO INHALERS  . Dysrhythmia     H/O [facility] PROBLEMS SINCE 2013  . Rotator cuff tear     left  . Difficult intubation   . Arthritis     knees, back;     Past Surgical History  Procedure Laterality Date  . Carpal tunnel release      x2  . Tonsillectomy    . Colonoscopy    . Cholecystectomy    . Shoulder arthroscopy with open rotator cuff repair Left 10/31/2015    Procedure: SHOULDER ARTHROSCOPY, SUBACROMIAL DECOMPRESSION, BICEPS TENOTOMY AND MINI OPEN ROTATOR CUFF REPAIR;  Surgeon: Thornton Park, MD;  Location: [facility] ORS;  Service: Orthopedics;  Laterality: Left;    There were no vitals filed for this visit.      Subjective Assessment - 03/05/16 0810    Subjective Patient reports she has some pain during the week, but feels less aggravated. Patient reports doing HEP.    Pertinent History personal factors affecting rehab: chronicity of left shoulder pain, fear avoidance, anxiety over injury; history of arthritis;    How long can you sit comfortably? 5-43mn;    How long can you stand comfortably? NA   How long can you walk comfortably? NA   Diagnostic tests had MRI which showed full thickness tear prior to surgery;    Patient Stated Goals  Get back to moving LUE; relieve some of pain;    Currently in Pain? Yes   Pain Score 1    Pain Location Shoulder   Pain Orientation Left   Pain Descriptors / Indicators Sore   Pain Onset 1 to 4 weeks ago      Treatment:  Warm up on arm bike, forwards and backwards, x 2 min. Each direction. History taken during warm up (unbilled for 3).  Sidelying shoulder flexion 3#, 2 x 10, min. VCs to maintain consistent plane and increase range throughout movement Sidelying abduction 1#, VCs to increase range and maintain shoulder ER  Grade I-II joint Mobs in posterior and inferior direction, 2 x 30 seconds each, hypomobility felt, patient states feels good, does not change quality of seated AROM AROM into flexion/abduction x 10 each direction to assess joint mobs, patient continues to have compensations of upper traps and lateral trunk lean to the R.   Qped: Lats stretch in prayer postion, 3 x 15 seconds, x  min VCs to hold position for 15 seconds to allow for stretch and maintain any gains made during joint mobs.  Y's on wall, x 10 reps, x 5 reps, min VCs to stand tall, min tactile cues to decrease upper trap dominance; with verbal and tactile cueing, patient had improved movement in glenohumeral  joint not shoulder girdle. Attempted to do lift off of wall at top of Y position, patient unable to perform correctly even with cueing due to weakness so discontinued x 3.   Standing red tband ER and shoulder flexion, 3 x 10, multiple verbal and tactile cues to increase ER with thumb pointing outwards from the body and keeping elbow bent instead of compensating with elbow extension.  Patient also instructed to perform shoulder flexion with decreased upper trap compensation and lateral trunk lean.  Throughout treatment patient required min-mod VCs and min tactile cueing to decrease compensation and increase technique.                           PT Education - 03/05/16 0847     Education provided Yes   Education Details muscle affected with surgery, HEP to focus, limiting shoulder compensation   Person(s) Educated Patient   Methods Explanation;Verbal cues;Tactile cues;Demonstration   Comprehension Verbalized understanding;Verbal cues required;Tactile cues required             PT Long Term Goals - 03/01/16 0955    PT LONG TERM GOAL #1   Title Patient will be independent in home exercise program to improve strength/mobility for better functional independence with ADLs.    Time 4   Period Weeks   Status On-going   PT LONG TERM GOAL #2   Title Patient will decrease Quick DASH score by > 8 points demonstrating reduced self-reported upper extremity disability.    Baseline From initial eval to now patient has improved from 88% to 65.9%   Time 4   Period Weeks   Status Achieved   PT LONG TERM GOAL #3   Title Patient will increase LUE shoulder AROM: sitting: Shoulder flexion >120 degrees, abduction: >120 degrees, supine: ER: >50 degrees, IR: >50 degrees for increased functional ROM with ADLs such as grooming, dressing etc   Time 4   Period Weeks   Status Not Met   PT LONG TERM GOAL #4   Title Patient will increase LUE shoulder strength to >4/5 for increased functional strength with lifting groceries, doing household chores etc.    Time 4   Period Weeks   Status Not Met   PT LONG TERM GOAL #5   Title Patient will report a worst pain of 3/10 on VAS in    LUE         to improve tolerance with ADLs and reduced symptoms with activities.    Baseline 8/10   Time 4   Period Weeks   Status Partially Met               Plan - 03/05/16 0848    Clinical Impression Statement Patient continues to have difficulty performing active shoulder ROM through a full range and continues to show shoulder compensations with upper trap dominance with motions above 90 degrees. Patient complained of no pain with grade I-II joint mobilizations; she is hypomobile in both  directions. Patient demonstrates decreased compensations during sidelying flexion and abduction, but explained to patien that these are not completely functional positions since she needs to be able to do activities in standing. Throughout session, patient has decrease LUE pain than usual. With VCs and tactile cues (min-mod), patient was able to improve form and decrease compensation during Ys on wall and  red tband ER. Patient would continue to benefit from skilled PT in order to address UE weakness, pain, ROM limitation, and to increase  functional independence.   Rehab Potential Poor   Clinical Impairments Affecting Rehab Potential positive: motivated, negative: co-morbidities, fear avoidance; Patient's clinical presentation is evolving as her pain is severe and she demonstrates significant fear avoidance limiting PROM;    PT Frequency 2x / week   PT Duration 4 weeks   PT Treatment/Interventions ADLs/Self Care Home Management;Cryotherapy;Electrical Stimulation;Moist Heat;Therapeutic exercise;Therapeutic activities;Functional mobility training;Ultrasound;Patient/family education;Manual techniques;Taping;Energy conservation;Dry needling   PT Next Visit Plan strengthening, AAROM/AROM, progress HEP, soft tissue, scapular mobilization/stabilition.    PT Home Exercise Plan instructed to focus on theraband activities, stretching, and pulleys with decreased compensation   Consulted and Agree with Plan of Care Patient      Patient will benefit from skilled therapeutic intervention in order to improve the following deficits and impairments:  Decreased endurance, Obesity, Decreased knowledge of precautions, Decreased activity tolerance, Decreased strength, Impaired UE functional use, Pain, Increased muscle spasms, Decreased mobility, Decreased range of motion, Improper body mechanics, Postural dysfunction, Impaired flexibility  Visit Diagnosis: Muscle weakness (generalized)  Left shoulder pain  Stiffness  of left shoulder, not elsewhere classified     Problem List Patient Active Problem List   Diagnosis Date Noted  . Pulmonary hypertension ([place]) 08/16/2012  . POTS (postural orthostatic tachycardia syndrome) 08/16/2012  . Chest pain 06/26/2012  . Shortness of breath 06/26/2012  . Rapid heart beat 06/26/2012   Tilman Neat, SPT This entire session was performed under direct supervision and direction of a licensed therapist/therapist assistant . I have personally read, edited and approve of the note as written.  Trotter,Margaret PT, DPT 03/05/2016, 1:48 PM  [place] MAIN [facility] SERVICES [address] [place], [place], [address] Phone: [phone]   Fax:  [phone]  Name: Claudia [name] MRN: [id number] Date of Birth: 1965-10-02

## 2016-03-07 ENCOUNTER — Ambulatory Visit: Payer: 59 | Admitting: Physical Therapy

## 2016-03-07 ENCOUNTER — Encounter: Payer: Self-pay | Admitting: Physical Therapy

## 2016-03-07 DIAGNOSIS — M25512 Pain in left shoulder: Secondary | ICD-10-CM

## 2016-03-07 DIAGNOSIS — M6281 Muscle weakness (generalized): Secondary | ICD-10-CM

## 2016-03-07 DIAGNOSIS — M25612 Stiffness of left shoulder, not elsewhere classified: Secondary | ICD-10-CM

## 2016-03-07 DIAGNOSIS — R29898 Other symptoms and signs involving the musculoskeletal system: Secondary | ICD-10-CM | POA: Diagnosis not present

## 2016-03-07 NOTE — Therapy (Signed)
[place] MAIN [facility] SERVICES [address] [place], [place], [address] Phone: [phone]   Fax:  [phone]  Physical Therapy Treatment  Patient Details  Name: Claudia [name] MRN: [id number] Date of Birth: June 23, 1966 Referring Provider: Thornton Park MD  Encounter Date: 03/07/2016      PT End of Session - 03/07/16 1025    Visit Number 29   Number of Visits 36   Date for PT Re-Evaluation 03/27/16   PT Start Time 0804   PT Stop Time 0849   PT Time Calculation (min) 45 min   Activity Tolerance Patient tolerated treatment well;Patient limited by pain   Behavior During Therapy [facility] for tasks assessed/performed      Past Medical History  Diagnosis Date  . Asthma     H/O YEARS AGO-NO INHALERS  . Dysrhythmia     H/O [facility] PROBLEMS SINCE 2013  . Rotator cuff tear     left  . Difficult intubation   . Arthritis     knees, back;     Past Surgical History  Procedure Laterality Date  . Carpal tunnel release      x2  . Tonsillectomy    . Colonoscopy    . Cholecystectomy    . Shoulder arthroscopy with open rotator cuff repair Left 10/31/2015    Procedure: SHOULDER ARTHROSCOPY, SUBACROMIAL DECOMPRESSION, BICEPS TENOTOMY AND MINI OPEN ROTATOR CUFF REPAIR;  Surgeon: Thornton Park, MD;  Location: [facility] ORS;  Service: Orthopedics;  Laterality: Left;    There were no vitals filed for this visit.      Subjective Assessment - 03/07/16 0807    Subjective Patient reports she is feeling more stiff than having pain today. Patient reports doing HEP.    Pertinent History personal factors affecting rehab: chronicity of left shoulder pain, fear avoidance, anxiety over injury; history of arthritis;    How long can you sit comfortably? 5-25mn;    How long can you stand comfortably? NA   How long can you walk comfortably? NA   Diagnostic tests had MRI which showed full thickness tear prior to surgery;    Patient Stated Goals Get back to  moving LUE; relieve some of pain;    Currently in Pain? No/denies   Pain Onset 1 to 4 weeks ago      Treatment:  Arm bike warm up; 3 min. Forwards, 2 min. Backwards. History was taken during first 2 min. Of exercise (unbilled 3 min.)  Sidelying shoulder flexion 3#, 2 x 10, min. VCs to maintain consistent plane, increase range throughout movement, and avoid bouncing at the end of range. Sidelying abduction 1# 2 x 12, VCs to increase range and maintain shoulder ER  Grade I-II joint Mobs in posterior and inferior direction, 2 x 30 seconds each, hypomobility felt, patient states feels like it's stretching.  Sidelying ER with towel at distal elbow to assist in mild shoulder ABD, 2#, 3 x 10,  VCs to decrease elbow extension, min. Tactile cueing in order to improve technique.   Re-educated in standing pulley for home use. x10 into shoulder abduction/flexion. Min VCs to decrease shoulder compensation (upper trap dominance) on LUE.  AAROM Pball wall rolls into shoulder flexion, 3 x 8, min. Verbal/tactile cueing to decrease upper trap dominance and R lateral trunk lean.  Serratus anterior plus push ups on wall, min VCs to perform rounding of shoulders not moving only the back  ER rotation with yellow tband resistance on wall, 2 x 8 difficulty  performing true ER instead of shoulder abduction, with VCs patient able to perform through small range. Standing ER with yellow tband resistance with towel roll at distal elbow, mod tactile cueing to allow patient to move through range and address correct muscle.  Improved strengthening with decrease ability to perform through greater range.                           PT Education - 03/07/16 1025    Education provided Yes   Education Details Exercise, Re-educated on pulleys   Person(s) Educated Patient   Methods Explanation;Demonstration;Verbal cues   Comprehension Verbalized understanding;Verbal cues required             PT  Long Term Goals - 03/01/16 0955    PT LONG TERM GOAL #1   Title Patient will be independent in home exercise program to improve strength/mobility for better functional independence with ADLs.    Time 4   Period Weeks   Status On-going   PT LONG TERM GOAL #2   Title Patient will decrease Quick DASH score by > 8 points demonstrating reduced self-reported upper extremity disability.    Baseline From initial eval to now patient has improved from 88% to 65.9%   Time 4   Period Weeks   Status Achieved   PT LONG TERM GOAL #3   Title Patient will increase LUE shoulder AROM: sitting: Shoulder flexion >120 degrees, abduction: >120 degrees, supine: ER: >50 degrees, IR: >50 degrees for increased functional ROM with ADLs such as grooming, dressing etc   Time 4   Period Weeks   Status Not Met   PT LONG TERM GOAL #4   Title Patient will increase LUE shoulder strength to >4/5 for increased functional strength with lifting groceries, doing household chores etc.    Time 4   Period Weeks   Status Not Met   PT LONG TERM GOAL #5   Title Patient will report a worst pain of 3/10 on VAS in    LUE         to improve tolerance with ADLs and reduced symptoms with activities.    Baseline 8/10   Time 4   Period Weeks   Status Partially Met               Plan - 03/07/16 1124    Clinical Impression Statement Patient has weakness and limited AROM without shoulder compensations. Patient is improving at her ability to notice when compensations are occurring and beginning to fix compensations independently.  She has no pain with grade II  posterior/inferior joint mobs. With bilateral UE pushing pball on the wall, patient had increase shoulder flexion with "stretching" feeling but no pain. Patient has very weak ER and requires tactile cueing throughout range in order to perform without compensations and strengthen intended muscle. Patient's pulley have improved since last assessed with improved shoulder motion.  Patient would benefit from continued PT in order to address decreased AROM, weakness, and  pain that limits her ability to be independent in ADLs.   Rehab Potential Poor   Clinical Impairments Affecting Rehab Potential positive: motivated, negative: co-morbidities, fear avoidance; Patient's clinical presentation is evolving as her pain is severe and she demonstrates significant fear avoidance limiting PROM;    PT Frequency 2x / week   PT Duration 4 weeks   PT Treatment/Interventions ADLs/Self Care Home Management;Cryotherapy;Electrical Stimulation;Moist Heat;Therapeutic exercise;Therapeutic activities;Functional mobility training;Ultrasound;Patient/family education;Manual techniques;Taping;Energy conservation;Dry needling   PT  Next Visit Plan strengthening, AAROM/AROM, progress HEP, soft tissue, scapular mobilization/stabilition.    PT Home Exercise Plan instructed to focus on theraband activities, stretching, and pulleys with decreased compensation   Consulted and Agree with Plan of Care Patient      Patient will benefit from skilled therapeutic intervention in order to improve the following deficits and impairments:  Decreased endurance, Obesity, Decreased knowledge of precautions, Decreased activity tolerance, Decreased strength, Impaired UE functional use, Pain, Increased muscle spasms, Decreased mobility, Decreased range of motion, Improper body mechanics, Postural dysfunction, Impaired flexibility  Visit Diagnosis: Muscle weakness (generalized)  Left shoulder pain  Stiffness of left shoulder, not elsewhere classified     Problem List Patient Active Problem List   Diagnosis Date Noted  . Pulmonary hypertension ([place]) 08/16/2012  . POTS (postural orthostatic tachycardia syndrome) 08/16/2012  . Chest pain 06/26/2012  . Shortness of breath 06/26/2012  . Rapid heart beat 06/26/2012   Tilman Neat, SPT This entire session was performed under direct supervision and direction of a  licensed therapist/therapist assistant . I have personally read, edited and approve of the note as written.  Trotter,Margaret PT, DPT 03/07/2016, 1:17 PM  [place] MAIN [facility] SERVICES [address] [place], [place], [address] Phone: [phone]   Fax:  [phone]  Name: SHERRILL BUIKEMA MRN: [id number] Date of Birth: 01/03/1966

## 2016-03-11 ENCOUNTER — Ambulatory Visit: Payer: 59 | Admitting: Physical Therapy

## 2016-03-11 ENCOUNTER — Encounter: Payer: Self-pay | Admitting: Physical Therapy

## 2016-03-11 DIAGNOSIS — M25512 Pain in left shoulder: Secondary | ICD-10-CM

## 2016-03-11 DIAGNOSIS — M6281 Muscle weakness (generalized): Secondary | ICD-10-CM

## 2016-03-11 DIAGNOSIS — R29898 Other symptoms and signs involving the musculoskeletal system: Secondary | ICD-10-CM | POA: Diagnosis not present

## 2016-03-11 DIAGNOSIS — M25612 Stiffness of left shoulder, not elsewhere classified: Secondary | ICD-10-CM

## 2016-03-11 NOTE — Therapy (Signed)
[place] MAIN [facility] SERVICES [address] [place], [place], [address] Phone: [phone]   Fax:  [phone]  Physical Therapy Treatment  Patient Details  Name: Claudia [name] MRN: [id number] Date of Birth: [birth year] Referring Provider: Thornton Park MD  Encounter Date: 03/11/2016      PT End of Session - 03/11/16 0854    Visit Number 30   Number of Visits 36   Date for PT Re-Evaluation 03/27/16   PT Start Time 0801   PT Stop Time 0846   PT Time Calculation (min) 45 min   Activity Tolerance Patient tolerated treatment well;Patient limited by pain   Behavior During Therapy [facility] for tasks assessed/performed      Past Medical History  Diagnosis Date  . Asthma     H/O YEARS AGO-NO INHALERS  . Dysrhythmia     H/O [facility] PROBLEMS SINCE 2013  . Rotator cuff tear     left  . Difficult intubation   . Arthritis     knees, back;     Past Surgical History  Procedure Laterality Date  . Carpal tunnel release      x2  . Tonsillectomy    . Colonoscopy    . Cholecystectomy    . Shoulder arthroscopy with open rotator cuff repair Left 10/31/2015    Procedure: SHOULDER ARTHROSCOPY, SUBACROMIAL DECOMPRESSION, BICEPS TENOTOMY AND MINI OPEN ROTATOR CUFF REPAIR;  Surgeon: Thornton Park, MD;  Location: [facility] ORS;  Service: Orthopedics;  Laterality: Left;    There were no vitals filed for this visit.      Subjective Assessment - 03/11/16 0852    Subjective Patient reports she has been feeling less pain, but continues to feel mild soreness. Patient reports she has a day or two of soreness after PT sessions but that it subsides with icing and using her home TENS unit. Patient reports doing HEP and able to do strengthening exercises 1-2x per week.    Pertinent History personal factors affecting rehab: chronicity of left shoulder pain, fear avoidance, anxiety over injury; history of arthritis;    How long can you sit comfortably? 5-23mn;     How long can you stand comfortably? NA   How long can you walk comfortably? NA   Diagnostic tests had MRI which showed full thickness tear prior to surgery;    Patient Stated Goals Get back to moving LUE; relieve some of pain;    Currently in Pain? No/denies   Pain Onset 1 to 4 weeks ago     Treatment:  Arm bike warm up x 4 min, BUE, Forwards, 2 min. Backwards, 2 min. (unbilled)  Soft tissues mobilization of upper traps, x 2 minutes, patient reports tenderness and discomfort, muscle tightness felt.   Grade II joint mobs in posterior/inferior direction, 2 x 20 seconds each, hypomobility felt, patient states feels like it's stretching. AAROM in supine, x 7 reps, patient noted improved range after joint mobs  Qped latissimus stretching, 3 x 15 seconds each, VCs to hold stretch and not bounce during activity.   Sidelying abduction 2# 2 x 10, VCs to increase range within non-painful movements and maintain shoulder ER  Standing shoulder flexion, x 10, x10 with 1# weight, min VCs and tactile cueing to improve AROM and decrease compensations. Standing shoulder abduction, x 5, increased "grabbing" feeling in upper traps, discontinued.  Standing ER with towel at distal elbow to assist in mild shoulder ABD, yellow tband resistance, 3 x 10, mod VCs to decrease  elbow extension, min. Tactile cueing in order to improve technique.  Visual Mirror feedback used  AAROM Pball wall rolls into shoulder flexion, 3 x 7, L shoulder ROM measured at 105 degrees flexion, min. Verbal/tactile cueing to decrease upper trap dominance and R lateral trunk lean.  Serratus anterior plus push ups on wall, 2 x10, min VCs to perform rounding of shoulders, improved form since last session.  Red tband shoulder strengthening, mirror feedback to decrease compensation, 2x 10 shoulder flexion, shoulder abduction x 2, continued "grabbing" so discontinued,   Standing upper traps stretch, 2 x 15 seconds, mod VCs to decrease  lateral trunk lean and maintain stretch.                             PT Education - 03/11/16 0853    Education provided Yes   Education Details HEP continuation, shoulder biomechanics   Person(s) Educated Patient   Methods Explanation;Demonstration;Verbal cues   Comprehension Verbal cues required;Verbalized understanding;Returned demonstration             PT Long Term Goals - 03/01/16 0955    PT LONG TERM GOAL #1   Title Patient will be independent in home exercise program to improve strength/mobility for better functional independence with ADLs.    Time 4   Period Weeks   Status On-going   PT LONG TERM GOAL #2   Title Patient will decrease Quick DASH score by > 8 points demonstrating reduced self-reported upper extremity disability.    Baseline From initial eval to now patient has improved from 88% to 65.9%   Time 4   Period Weeks   Status Achieved   PT LONG TERM GOAL #3   Title Patient will increase LUE shoulder AROM: sitting: Shoulder flexion >120 degrees, abduction: >120 degrees, supine: ER: >50 degrees, IR: >50 degrees for increased functional ROM with ADLs such as grooming, dressing etc   Time 4   Period Weeks   Status Not Met   PT LONG TERM GOAL #4   Title Patient will increase LUE shoulder strength to >4/5 for increased functional strength with lifting groceries, doing household chores etc.    Time 4   Period Weeks   Status Not Met   PT LONG TERM GOAL #5   Title Patient will report a worst pain of 3/10 on VAS in    LUE         to improve tolerance with ADLs and reduced symptoms with activities.    Baseline 8/10   Time 4   Period Weeks   Status Partially Met               Plan - 03/11/16 0854    Clinical Impression Statement Patient has limited AROM and shoulder weakness with continued shoulder compensations in a standing position. Patient has difficulties with body awareness and requires multiple cues in order to address technique  issues; however with several cues and mirror feedback, patient is increasing her ability to self-correct her technique issues in order to improve the quality of performing her exercises. She reports slight pulling in shoulder during grade II posterior/inferior mobilizations and mild discomfort with grade III posterior shoulder mobilization that decreased with returnning to grade II. Patient requires mod verbal and tactile cues during exercises to improve form and decrease compensations.  Patient achieved 105 degrees of shoulder flexion with bilateral hand pball rolls on the wall. Patient would benefit from continued PT in order to  address decreased AROM, weakness, and pain that limits her ability to perform ADLs.    Rehab Potential Fair   Clinical Impairments Affecting Rehab Potential positive: motivated, negative: co-morbidities, fear avoidance; Patient's clinical presentation is evolving as her pain is severe and she demonstrates significant fear avoidance limiting PROM;    PT Frequency 2x / week   PT Duration 4 weeks   PT Treatment/Interventions ADLs/Self Care Home Management;Cryotherapy;Electrical Stimulation;Moist Heat;Therapeutic exercise;Therapeutic activities;Functional mobility training;Ultrasound;Patient/family education;Manual techniques;Taping;Energy conservation;Dry needling   PT Next Visit Plan strengthening, AAROM/AROM, progress HEP, soft tissue, scapular mobilization/stabilition.    PT Home Exercise Plan continue as instructed, instructed to perform strengthening exercises more than 1-2x per week.    Consulted and Agree with Plan of Care Patient      Patient will benefit from skilled therapeutic intervention in order to improve the following deficits and impairments:  Decreased endurance, Obesity, Decreased knowledge of precautions, Decreased activity tolerance, Decreased strength, Impaired UE functional use, Pain, Increased muscle spasms, Decreased mobility, Decreased range of motion,  Improper body mechanics, Postural dysfunction, Impaired flexibility  Visit Diagnosis: Muscle weakness (generalized)  Left shoulder pain  Stiffness of left shoulder, not elsewhere classified     Problem List Patient Active Problem List   Diagnosis Date Noted  . Pulmonary hypertension ([place]) 08/16/2012  . POTS (postural orthostatic tachycardia syndrome) 08/16/2012  . Chest pain 06/26/2012  . Shortness of breath 06/26/2012  . Rapid heart beat 06/26/2012   Tilman Neat, SPT This entire session was performed under direct supervision and direction of a licensed therapist/therapist assistant . I have personally read, edited and approve of the note as written.  Trotter,Margaret  PT, DPT  03/11/2016, 10:57 AM  [place] MAIN [facility] SERVICES [address] [place], [place], [address] Phone: [phone]   Fax:  [phone]  Name: Claudia [name] MRN: [id number] Date of Birth: [birth year]

## 2016-03-13 ENCOUNTER — Ambulatory Visit: Payer: 59 | Admitting: Physical Therapy

## 2016-03-13 ENCOUNTER — Encounter: Payer: Self-pay | Admitting: Physical Therapy

## 2016-03-13 DIAGNOSIS — M25512 Pain in left shoulder: Secondary | ICD-10-CM

## 2016-03-13 DIAGNOSIS — M6281 Muscle weakness (generalized): Secondary | ICD-10-CM | POA: Diagnosis not present

## 2016-03-13 DIAGNOSIS — R29898 Other symptoms and signs involving the musculoskeletal system: Secondary | ICD-10-CM | POA: Diagnosis not present

## 2016-03-13 DIAGNOSIS — M25612 Stiffness of left shoulder, not elsewhere classified: Secondary | ICD-10-CM

## 2016-03-13 NOTE — Therapy (Signed)
[place] MAIN [facility] SERVICES [address] [place], [place], [address] Phone: [phone]   Fax:  [phone]  Physical Therapy Treatment/Progress Note  Patient Details  Name: Claudia [name] MRN: [id number] Date of Birth: 01-Sep-1965 Referring Provider: Thornton Park MD  Encounter Date: 03/13/2016      PT End of Session - 03/13/16 0849    Visit Number 31   Number of Visits 36   Date for PT Re-Evaluation 03/27/16   PT Start Time 0806   PT Stop Time 0845   PT Time Calculation (min) 39 min   Activity Tolerance Patient tolerated treatment well;Patient limited by pain   Behavior During Therapy [facility] for tasks assessed/performed      Past Medical History  Diagnosis Date  . Asthma     H/O YEARS AGO-NO INHALERS  . Dysrhythmia     H/O [facility] PROBLEMS SINCE 2013  . Rotator cuff tear     left  . Difficult intubation   . Arthritis     knees, back;     Past Surgical History  Procedure Laterality Date  . Carpal tunnel release      x2  . Tonsillectomy    . Colonoscopy    . Cholecystectomy    . Shoulder arthroscopy with open rotator cuff repair Left 10/31/2015    Procedure: SHOULDER ARTHROSCOPY, SUBACROMIAL DECOMPRESSION, BICEPS TENOTOMY AND MINI OPEN ROTATOR CUFF REPAIR;  Surgeon: Thornton Park, MD;  Location: [facility] ORS;  Service: Orthopedics;  Laterality: Left;    There were no vitals filed for this visit.      Subjective Assessment - 03/13/16 0809    Subjective Patient reports she has been feeling discomfort throughout shoulder. Patient reports she has an increase in pain after therapy sessions, but she notes it isn't as bad as previous times. Patient reports having MD appointment 7/20.    Pertinent History personal factors affecting rehab: chronicity of left shoulder pain, fear avoidance, anxiety over injury; history of arthritis;    How long can you sit comfortably? 5-23mn;    How long can you stand comfortably? NA   How  long can you walk comfortably? NA   Diagnostic tests had MRI which showed full thickness tear prior to surgery;    Patient Stated Goals Get back to moving LUE; relieve some of pain;    Currently in Pain? No/denies   Pain Onset 1 to 4 weeks ago            [facility]    Observation/Other Assessments   Quick DASH  47.7%, the higher the score the greater the disability. Improved from 03/01/16 from 65.9%. Per patient report, she does not use her L arm to do much of household lifting or activities but stated in her score it was only moderate difficulty on questionnaire   AROM   Left Shoulder Extension 38 Degrees   Left Shoulder Flexion 91 Degrees   Left Shoulder ABduction 75 Degrees   Left Shoulder Internal Rotation 44 Degrees  stopping for GH joint    Left Shoulder External Rotation 43 Degrees   PROM   Left Shoulder Flexion 110 Degrees  AAROM   Left Shoulder ABduction 90 Degrees  AAROM   Strength   Overall Strength Comments RUE and L wrist/elbow WFL, able to tolerate moderate resistance when shoulder is at 0 degrees, however with shoulder flexion at 90 degrees patient able to resist only light pressure before pain  Treatment:  Patient's goals, outcomes measures, AROM, and AAROM assessed.  Warm up on arm bike x4 minutes, BUE, 2 min. Forward, 2 min. Backwards. History taken during activity.  Grade II-III joint mobs in posterior/inferior direction, 2 x 45 seconds each, hypomobility felt, patient states feels like it's stretching but c/o no pain. AAROM in supine, x 5 reps, measurement performed on last rest, shoulder flexion 110 degrees, shoulder abduction 90 degrees.   Y wall slides x 10  Pball on wall into shoulder flexion, x 10  For exercises listed above, patient requires min verbal and tactile cueing to decrease R lateral trunk lean and upper trap dominance  ER shoulder strengthening, 2 x10, rest in between, min verbal and tactile cueing to perform  in pure transverse plane, and decrease whole body rotation.                            PT Education - 03/13/16 0849    Education provided Yes   Education Details continue HEP, progression towards goals   Person(s) Educated Patient   Methods Explanation;Verbal cues;Tactile cues;Demonstration   Comprehension Verbalized understanding;Returned demonstration;Verbal cues required;Tactile cues required             PT Long Term Goals - 03/13/16 0902    PT LONG TERM GOAL #1   Title Patient will be independent in home exercise program to improve strength/mobility for better functional independence with ADLs.    Time 4   Period Weeks   Status Achieved   PT LONG TERM GOAL #2   Title Patient will decrease Quick DASH score by > 8 points demonstrating reduced self-reported upper extremity disability.    Baseline From initial eval to now patient has improved from 88% to 65.9%   Time 4   Period Weeks   Status Achieved   PT LONG TERM GOAL #3   Title Patient will increase LUE shoulder AROM: sitting: Shoulder flexion >120 degrees, abduction: >120 degrees, supine: ER: >50 degrees, IR: >50 degrees for increased functional ROM with ADLs such as grooming, dressing etc   Time 4   Period Weeks   Status Not Met   PT LONG TERM GOAL #4   Title Patient will increase LUE shoulder strength to >4/5 for increased functional strength with lifting groceries, doing household chores etc.    Baseline Able to achieve 4/5 or 4-/5 with shoulder at neutral against body but not into range   Time 4   Period Weeks   Status Partially Met   PT LONG TERM GOAL #5   Title Patient will report a worst pain of 3/10 on VAS in    LUE         to improve tolerance with ADLs and reduced symptoms with activities.    Baseline 8/10 at baseline, now 4-5/10   Time 4   Period Weeks   Status Partially Met               Plan - 03/13/16 0850    Clinical Impression Statement Patient was reassessed for her  goals today since she has a MD visit tomorrow with Dr. Mack Guise. Patient continues to have limited AAROM in supine and AROM in seated. Patient stops whenever pulling is felt due to fear of pushing and increasing her pain. From last measurement 03/01/16, patient has improved ROM due to less pain limitation, but numbers continue to be similar to initial measurements. Supine AAROM patient is at 110 in flexion  and 91-92 in abduction. Patient has continued shouulder compensations with any movement above 90 degrees. During exercises, patient is reminded to decrease upper trap dominance and R lateral trunk lean. Patient's progression to goals is unchanged, but patient now has decreased pain during the day and subjectively has less impairment based on the QuickDash. Patient's quality of movement appears to be improving with AAROM with multiple cueing although measurements show little to no changes. Patient's status is on hold in order to address MD's further direction. However, due to limited changes in status, patient will most likely be moved towards discharge.    Rehab Potential Fair   Clinical Impairments Affecting Rehab Potential positive: motivated, negative: co-morbidities, fear avoidance; Patient's clinical presentation is evolving as her pain is severe and she demonstrates significant fear avoidance limiting PROM;    PT Frequency 2x / week   PT Duration 4 weeks   PT Treatment/Interventions ADLs/Self Care Home Management;Cryotherapy;Electrical Stimulation;Moist Heat;Therapeutic exercise;Therapeutic activities;Functional mobility training;Ultrasound;Patient/family education;Manual techniques;Taping;Energy conservation;Dry needling   PT Next Visit Plan strengthening, AAROM/AROM, progress HEP, soft tissue, joint mobilizations   PT Home Exercise Plan continue as instructed,    Consulted and Agree with Plan of Care Patient      Patient will benefit from skilled therapeutic intervention in order to improve the  following deficits and impairments:  Decreased endurance, Obesity, Decreased knowledge of precautions, Decreased activity tolerance, Decreased strength, Impaired UE functional use, Pain, Increased muscle spasms, Decreased mobility, Decreased range of motion, Improper body mechanics, Postural dysfunction, Impaired flexibility  Visit Diagnosis: Muscle weakness (generalized)  Left shoulder pain  Stiffness of left shoulder, not elsewhere classified  Shoulder stiffness, left  Weakness of left arm     Problem List Patient Active Problem List   Diagnosis Date Noted  . Pulmonary hypertension ([place]) 08/16/2012  . POTS (postural orthostatic tachycardia syndrome) 08/16/2012  . Chest pain 06/26/2012  . Shortness of breath 06/26/2012  . Rapid heart beat 06/26/2012   Tilman Neat, SPT This entire session was performed under direct supervision and direction of a licensed therapist/therapist assistant . I have personally read, edited and approve of the note as written.  Trotter,Margaret PT, DPT 03/13/2016, 9:34 AM  [place] MAIN [facility] SERVICES [address] [place], [place], [address] Phone: [phone]   Fax:  [phone]  Name: ILISSA ROSNER MRN: [id number] Date of Birth: 12/20/1965

## 2016-03-14 DIAGNOSIS — M75122 Complete rotator cuff tear or rupture of left shoulder, not specified as traumatic: Secondary | ICD-10-CM | POA: Diagnosis not present

## 2016-03-19 ENCOUNTER — Ambulatory Visit: Payer: 59 | Admitting: Physical Therapy

## 2016-03-19 ENCOUNTER — Encounter: Payer: Self-pay | Admitting: Physical Therapy

## 2016-03-19 DIAGNOSIS — M6281 Muscle weakness (generalized): Secondary | ICD-10-CM | POA: Diagnosis not present

## 2016-03-19 DIAGNOSIS — M25612 Stiffness of left shoulder, not elsewhere classified: Secondary | ICD-10-CM

## 2016-03-19 DIAGNOSIS — R29898 Other symptoms and signs involving the musculoskeletal system: Secondary | ICD-10-CM | POA: Diagnosis not present

## 2016-03-19 DIAGNOSIS — M25512 Pain in left shoulder: Secondary | ICD-10-CM | POA: Diagnosis not present

## 2016-03-19 NOTE — Patient Instructions (Signed)
Arm bike   Joint mobs  Qped stretching  Abd 2#, 3x5   Shouler abduction, 2x5 into further range

## 2016-03-19 NOTE — Therapy (Signed)
[place] MAIN [facility] SERVICES [address] [place], [place], [address] Phone: [phone]   Fax:  [phone]  Physical Therapy Treatment/Discharge Summary  Patient Details  Name: Claudia [name] MRN: [id number] Date of Birth: [birth year] Referring Provider: Thornton Park MD  Encounter Date: 03/19/2016      PT End of Session - 03/19/16 0855    Visit Number 32   Number of Visits 36   Date for PT Re-Evaluation 03/27/16   PT Start Time 0804   PT Stop Time 0846   PT Time Calculation (min) 42 min   Activity Tolerance Patient tolerated treatment well;Patient limited by pain   Behavior During Therapy [facility] for tasks assessed/performed      Past Medical History:  Diagnosis Date  . Arthritis    knees, back;   . Asthma    H/O YEARS AGO-NO INHALERS  . Difficult intubation   . Dysrhythmia    H/O [facility] PROBLEMS SINCE 2013  . Rotator cuff tear    left    Past Surgical History:  Procedure Laterality Date  . CARPAL TUNNEL RELEASE     x2  . CHOLECYSTECTOMY    . COLONOSCOPY    . SHOULDER ARTHROSCOPY WITH OPEN ROTATOR CUFF REPAIR Left 10/31/2015   Procedure: SHOULDER ARTHROSCOPY, SUBACROMIAL DECOMPRESSION, BICEPS TENOTOMY AND MINI OPEN ROTATOR CUFF REPAIR;  Surgeon: Thornton Park, MD;  Location: [facility] ORS;  Service: Orthopedics;  Laterality: Left;  . TONSILLECTOMY      There were no vitals filed for this visit.      Subjective Assessment - 03/19/16 0807    Subjective Patient states that her MD appointment went well, and that she saw the PA. Per patient, he thinks she has been progressing and should continue physical therapy. Patient states she was putting a jug of water into the fridge with her L arm, and it irritated her L shoulder.     Pertinent History personal factors affecting rehab: chronicity of left shoulder pain, fear avoidance, anxiety over injury; history of arthritis;    How long can you sit comfortably? 5-36mn;    How  long can you stand comfortably? NA   How long can you walk comfortably? NA   Diagnostic tests had MRI which showed full thickness tear prior to surgery;    Patient Stated Goals Get back to moving LUE; relieve some of pain;    Currently in Pain? Yes   Pain Score 1    Pain Location Shoulder   Pain Orientation Left   Pain Onset 1 to 4 weeks ago      Treatment:   Arm bike warm up x 4 min, BUE, Forwards, 3 min. Backwards, 1 min. Taking history throughout warm-up.  AAROM in supine, x5 into shoulder flexion/abduction. Limited by stiffness and stretching feeling.  Grade III joint mobs in posterior/inferior direction, 3 x 15 seconds each, hypomobility felt, patient states feels like it's stretching. AAROM in supine, x 7 reps, improved motion compared to AAROM previous to joint mobs.  Qped B shoulder flexion, 3 x 15 seconds each  Sidelying abduction 2#  3x 5, patient reports increased difficulty with weight today.  VCs to maintain shoulder ER Sidelying abduction, no weight, 2x5 VCs to increase range as far as possible without pain.  Standing shoulder flexion (1 set)/abduction (2 sets), x 10, 1# weight, min VCs and tactile cueing to decrease compensations of upper trap dominance.  Standing ER with towel at distal elbow to assist in mild shoulder ABD,  yellow tband resistance, 2 x 10, min VCs to decrease bouncing at end range and improve eccentric control.  Shoulder ladder into shoulder ABD, 3 x multiple steps, min VCs to decrease lateral trunk lean and increase ROM through a non-painful range.  UE ranger on wall, 2 x10 into shoulder flexion, min VCs to perform with decreased lateral trunk lean.                            PT Education - 03/19/16 0854    Education provided Yes   Education Details decreasing compensation, progression of therapy   Person(s) Educated Patient   Methods Explanation;Verbal cues;Tactile cues;Demonstration   Comprehension Verbalized  understanding;Verbal cues required;Tactile cues required;Returned demonstration             PT Long Term Goals - 03/19/16 0901      PT LONG TERM GOAL #1   Title Patient will be independent in home exercise program to improve strength/mobility for better functional independence with ADLs.    Time 4   Period Weeks   Status Achieved     PT LONG TERM GOAL #2   Title Patient will decrease Quick DASH score by > 8 points demonstrating reduced self-reported upper extremity disability.    Baseline From initial eval to now patient has improved from 88% to 65.9%   Time 4   Period Weeks   Status Achieved     PT LONG TERM GOAL #3   Title Patient will increase LUE shoulder AROM: sitting: Shoulder flexion >120 degrees, abduction: >120 degrees, supine: ER: >50 degrees, IR: >50 degrees for increased functional ROM with ADLs such as grooming, dressing etc   Time 4   Period Weeks   Status Not Met     PT LONG TERM GOAL #4   Title Patient will increase LUE shoulder strength to >4/5 for increased functional strength with lifting groceries, doing household chores etc.    Baseline Able to achieve 4/5 or 4-/5 with shoulder at neutral against body but not into range   Time 4   Period Weeks   Status Partially Met     PT LONG TERM GOAL #5   Title Patient will report a worst pain of 3/10 on VAS in    LUE         to improve tolerance with ADLs and reduced symptoms with activities.    Baseline 8/10 at baseline, now 4-5/10   Time 4   Period Weeks   Status Partially Met               Plan - 03/19/16 0855    Clinical Impression Statement Patient was able to tolerate treatment well today with continued joint mobs and focusing on shoulder flexion/abduction with weights and AAROM. Patient did note mild pain and pulling with shoulder ABD. If pain persisted exercise was discontinued, which was only attemtping ABD with the Ranger on the wall. After communicating to the physician's assistant on the  phone and patient at this session. It was recommened that due to patient's unchanged mobility and independet functional status, patient should hold on physical therapy and perform exercises at home in order to address residual weakness and limited AROM. Patient agreed and was told to call with any questions/issues in HEP or if shoulder seemed to be becoming more hypomobile with decreased function. Patient agreed with plan of care, and patient is being discharged today.     Rehab Potential Fair  Clinical Impairments Affecting Rehab Potential positive: motivated, negative: co-morbidities, fear avoidance; Patient's clinical presentation is evolving as her pain is severe and she demonstrates significant fear avoidance limiting PROM;    PT Frequency 2x / week   PT Duration 4 weeks   PT Treatment/Interventions ADLs/Self Care Home Management;Cryotherapy;Electrical Stimulation;Moist Heat;Therapeutic exercise;Therapeutic activities;Functional mobility training;Ultrasound;Patient/family education;Manual techniques;Taping;Energy conservation;Dry needling   PT Next Visit Plan strengthening, AAROM/AROM, progress HEP, soft tissue, joint mobilizations   PT Home Exercise Plan continue as instructed,    Consulted and Agree with Plan of Care Patient      Patient will benefit from skilled therapeutic intervention in order to improve the following deficits and impairments:  Decreased endurance, Obesity, Decreased knowledge of precautions, Decreased activity tolerance, Decreased strength, Impaired UE functional use, Pain, Increased muscle spasms, Decreased mobility, Decreased range of motion, Improper body mechanics, Postural dysfunction, Impaired flexibility  Visit Diagnosis: Muscle weakness (generalized)  Left shoulder pain  Stiffness of left shoulder, not elsewhere classified     Problem List Patient Active Problem List   Diagnosis Date Noted  . Pulmonary hypertension ([place]) 08/16/2012  . POTS (postural  orthostatic tachycardia syndrome) 08/16/2012  . Chest pain 06/26/2012  . Shortness of breath 06/26/2012  . Rapid heart beat 06/26/2012   Tilman Neat, SPT This entire session was performed under direct supervision and direction of a licensed therapist/therapist assistant . I have personally read, edited and approve of the note as written.  Trotter,Margaret PT, DPT 03/19/2016, 11:28 AM  [place] MAIN [facility] SERVICES [address] [place], [place], [address] Phone: [phone]   Fax:  [phone]  Name: Claudia [name] MRN: [id number] Date of Birth: 1966-03-26

## 2016-03-21 ENCOUNTER — Ambulatory Visit: Payer: 59 | Admitting: Physical Therapy

## 2016-03-25 ENCOUNTER — Encounter: Payer: 59 | Admitting: Physical Therapy

## 2016-04-12 DIAGNOSIS — M25512 Pain in left shoulder: Secondary | ICD-10-CM | POA: Diagnosis not present

## 2016-04-15 ENCOUNTER — Other Ambulatory Visit: Payer: Self-pay | Admitting: Orthopedic Surgery

## 2016-04-15 DIAGNOSIS — M25512 Pain in left shoulder: Secondary | ICD-10-CM

## 2016-04-25 ENCOUNTER — Ambulatory Visit
Admission: RE | Admit: 2016-04-25 | Discharge: 2016-04-25 | Disposition: A | Discharge status: Home / Self Care | Payer: 59 | Source: Ambulatory Visit | Attending: Orthopedic Surgery | Admitting: Orthopedic Surgery

## 2016-04-25 DIAGNOSIS — M25512 Pain in left shoulder: Secondary | ICD-10-CM | POA: Diagnosis not present

## 2016-04-25 MED ORDER — SODIUM CHLORIDE 0.9 % IJ SOLN
10.0000 mL | INTRAMUSCULAR | Status: DC | PRN
  Administered 2016-04-25: 5 mL
  Filled 2016-04-25: qty 10

## 2016-04-25 MED ORDER — IOPAMIDOL (ISOVUE-300) INJECTION 61%
10.0000 mL | Freq: Once | INTRAVENOUS | Status: AC | PRN
  Administered 2016-04-25: 10 mL

## 2016-04-25 MED ORDER — GADOBENATE DIMEGLUMINE 529 MG/ML IV SOLN
0.1000 mL | Freq: Once | INTRAVENOUS | Status: AC | PRN
  Administered 2016-04-25: 0.1 mL via INTRA_ARTICULAR

## 2016-05-13 DIAGNOSIS — M7542 Impingement syndrome of left shoulder: Secondary | ICD-10-CM | POA: Diagnosis not present

## 2016-05-17 ENCOUNTER — Ambulatory Visit: Payer: 59 | Attending: Orthopedic Surgery | Admitting: Physical Therapy

## 2016-05-17 ENCOUNTER — Encounter: Payer: Self-pay | Admitting: Physical Therapy

## 2016-05-17 ENCOUNTER — Other Ambulatory Visit: Payer: Self-pay | Admitting: Orthopedic Surgery

## 2016-05-17 DIAGNOSIS — M25512 Pain in left shoulder: Secondary | ICD-10-CM

## 2016-05-17 DIAGNOSIS — M6281 Muscle weakness (generalized): Secondary | ICD-10-CM | POA: Insufficient documentation

## 2016-05-17 DIAGNOSIS — M62838 Other muscle spasm: Secondary | ICD-10-CM | POA: Insufficient documentation

## 2016-05-17 DIAGNOSIS — M25612 Stiffness of left shoulder, not elsewhere classified: Secondary | ICD-10-CM | POA: Insufficient documentation

## 2016-05-17 NOTE — Therapy (Addendum)
[facility] [address] [place], [address] Phone: [phone]   Fax:  [phone]  Physical Therapy Evaluation  Patient Details  Name: Claudia [name] MRN: [id number] Date of Birth: [birth year] Referring Provider: Juanell Fairly MD  Encounter Date: 05/17/2016      PT End of Session - 05/17/16 1040    Visit Number 1   Number of Visits 16   Date for PT Re-Evaluation 07/12/16   PT Start Time 0910   PT Stop Time 1018   PT Time Calculation (min) 68 min   Activity Tolerance Patient tolerated treatment well;Patient limited by pain   Behavior During Therapy [facility] for tasks assessed/performed      Past Medical History:  Diagnosis Date  . Arthritis    knees, back;   . Asthma    H/O YEARS AGO-NO INHALERS  . Difficult intubation   . Dysrhythmia    H/O [facility] PROBLEMS SINCE 2013  . Rotator cuff tear    left    Past Surgical History:  Procedure Laterality Date  . CARPAL TUNNEL RELEASE     x2  . CHOLECYSTECTOMY    . COLONOSCOPY    . SHOULDER ARTHROSCOPY WITH OPEN ROTATOR CUFF REPAIR Left 10/31/2015   Procedure: SHOULDER ARTHROSCOPY, SUBACROMIAL DECOMPRESSION, BICEPS TENOTOMY AND MINI OPEN ROTATOR CUFF REPAIR;  Surgeon: Juanell Fairly, MD;  Location: [facility] ORS;  Service: Orthopedics;  Laterality: Left;  . TONSILLECTOMY      There were no vitals filed for this visit.       Subjective Assessment - 05/17/16 0927    Subjective Patient reports pain in left shoulder and is self managing pain with medication and TENS unit. Pain is moderate and is constant.    Pertinent History left shoulder pain over the last couple of years when she had a pop in left shoulder after raising arm in shower. She was treated conservatively with PT and medication and taken OOW 3 months. She was able to return to work at that time and pain progressed and she had further work up with diagnosis of RTC tear. She underwent surgery  10/31/15 for RTC and has had PT s/p RTC with continued pain as primary limiting factor to full function.   Limitations House hold activities;Other (comment);Lifting  personal care, work related tasks   Diagnostic tests had MRI which showed full thickness tear prior to surgery; post  surgery X rays and attempted MRI without results recently 03/2016   Patient Stated Goals pain relief to  be able to care for self, hair care, use left UE for housework, lifting pots, pans   Currently in Pain? Yes   Pain Score 5   ranges from 2/10 up to 7/10   Pain Location Shoulder   Pain Orientation Left   Pain Type Chronic pain   Pain Onset More than a month ago   Pain Frequency Constant   Aggravating Factors  reaching across or back (seatbelt) and overhead with  left UE   Pain Relieving Factors TENS, ice, medication   Effect of Pain on Daily Activities limited ability to use left UE for personal care and household chores            [facility] PT Assessment - 05/17/16 0914      Assessment   Medical Diagnosis impingement syndrome left shoulder M75.42   Referring Provider Juanell Fairly MD   Onset Date/Surgical Date 10/31/15   Hand Dominance Right   Next MD Visit  06/24/16   Prior Therapy PT post surgery left RTC repair 10/31/15 through 02/2016     Precautions   Precautions --   Type of Shoulder Precautions --   Required Braces or Orthoses --     Restrictions   Weight Bearing Restrictions --     Home Environment   Living Environment Private residence   Living Arrangements Children  daughter 54 yo   Home Access Level entry   Entrance Stairs-Number of Steps --   Entrance Stairs-Rails --   Home Layout Two level   Alternate Level Stairs-Number of Steps 10   Alternate Level Stairs-Rails Left   Home Equipment Shower seat;Hand held shower head   Additional Comments --     Prior Function   Level of Independence Independent;Independent with gait;Independent with transfers   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[employer]; 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;    [employer] bathing patients, vital signs, help patients with transporting and in bed   Leisure going to movies, going to dinner, hanging out with family/friends;                                                                                                                                                                             Objective; AROM: left shoulder flexion 70 degrees, abduction 60 degrees, ER unable to reach ear, IR to side of left hip AAROM: left shoulder supine; flexion 120, abduction 100, ER 70, IR across trunk Strength: grossly tested isometric hold in neutral flexion, abduction, rotations both UE's; strong, without reproduction of symptoms Special tests; Neer impingement left shoulder (+) for left shoulder pain Posture: hiking left shoulder Palpation: + spasms along left upper trapezius, medial border of left scapula  Treatment: Therapeutic exercise: patient performed exercises with guidance, verbal and tactile cues and demonstration of PT: Standing at door frame; scapular retraction x 10 scaption x 10 Modalities: Electrical stimulation: russian stimulation 10/10 cycle with patient performing scapular retraction with each cycle; high volt estim., for muscle spasms, intensity to comfort. patient seated in chair with UE's supported, moist heat applied to left shoulder during treatment x 20 min. (no adverse reactions noted)  Patient response to treatment: demonstrated good technique/postures with exercises following demonstration and with moderate VC; reported decreased pain to mild 2/10 with electrical stimulation         PT Education - 05/17/16 1013    Education provided Yes   Education Details HEP: scapular retraction at door frame, scaption, pain control with ice/heat   Person(s) Educated Patient   Methods Explanation;Demonstration;Verbal cues    Comprehension Verbalized understanding;Returned demonstration;Verbal cues required             PT Long Term Goals -  05/17/16 1036      PT LONG TERM GOAL #1   Title Patient will be independent in home exercise program to improve strength/mobility for better functional independence with ADLs b 07/12/2016.    Baseline limited knowledge of appropriate pain control strategies, exercise progression for strength and ROM   Status New     PT LONG TERM GOAL #2   Title Patient will decrease Quick DASH score by > 8 points demonstrating reduced self-reported upper extremity disability every 4 weeks with <30% disabilty by 07/12/2016.    Baseline Quick dash 70%   Status New     PT LONG TERM GOAL #3   Title Patient will increase LUE shoulder AROM: sitting: Shoulder flexion >120 degrees, abduction: >120 degrees, supine: ER: >50 degrees, IR: >50 degrees for increased functional ROM with ADLs such as grooming, dressing etc by 07/12/2016   Baseline AROM left shoulder sitting: flexion 70, abduction 60, IR to side of hip, ER severe limitation with functional ROM; unable to raise to ear   Status New     PT LONG TERM GOAL #4   Title Patient will report pain level max. of 2/10 with right UE use by 07/12/2016 to allow improved function with overhead activities/personal care   Baseline pain leve max. 7/10 with using right UE for daily activities   Status New               Plan - 05/17/16 1029    Clinical Impression Statement Patient is a 50 year old right hand dominant female who presents with pain and decreased function with left UE s/p RTC surgery 10/31/15. She has quickDash score of 70% self perceived disability for left UE use and pain. She has difficulty with sleeping and has limited knoweldge of appropriate pain control strategies, progression of exercises in order to improve functional use of left UE and will benefit from physical therapy intervention to address limitations.   Rehab Potential  Fair   Clinical Impairments Affecting Rehab Potential (+) age, motivated (-)chronic pain right shoulder even following surgery 10/2015   PT Frequency 2x / week   PT Duration 8 weeks   PT Treatment/Interventions Iontophoresis 4mg /ml Dexamethasone;Electrical Stimulation;Cryotherapy;Moist Heat;Ultrasound;Patient/family education;Neuromuscular re-education;Therapeutic exercise;Manual techniques;Scar mobilization;Taping;Dry needling   PT Next Visit Plan modalities for pain control, muslce re education, ther. ex for motor control, strength, manual STM   PT Home Exercise Plan re education for scapular control, scaption for movement, pain control with ice/heat/TENS   Consulted and Agree with Plan of Care Patient      Patient will benefit from skilled therapeutic intervention in order to improve the following deficits and impairments:  Impaired UE functional use, Increased muscle spasms, Decreased activity tolerance, Decreased knowledge of precautions, Pain, Impaired perceived functional ability, Decreased strength  Visit Diagnosis: Pain in left shoulder - Plan: PT plan of care cert/re-cert  Stiffness of left shoulder, not elsewhere classified - Plan: PT plan of care cert/re-cert  Muscle weakness (generalized) - Plan: PT plan of care cert/re-cert     Problem List Patient Active Problem List   Diagnosis Date Noted  . Pulmonary hypertension (HCC) 08/16/2012  . POTS (postural orthostatic tachycardia syndrome) 08/16/2012  . Chest pain 06/26/2012  . Shortness of breath 06/26/2012  . Rapid heart beat 06/26/2012    Beacher May PT 05/17/2016, 9:23 PM  [facility] PHYSICAL AND SPORTS MEDICINE [address] [place], [address] Phone: [phone]   Fax:  [phone]  Name: ZULEYMA SCHARF MRN:  [id number] Date of Birth: [birth year]

## 2016-05-22 ENCOUNTER — Encounter: Payer: Self-pay | Admitting: Physical Therapy

## 2016-05-22 ENCOUNTER — Ambulatory Visit: Payer: 59 | Admitting: Physical Therapy

## 2016-05-22 DIAGNOSIS — M6281 Muscle weakness (generalized): Secondary | ICD-10-CM | POA: Diagnosis not present

## 2016-05-22 DIAGNOSIS — M25512 Pain in left shoulder: Secondary | ICD-10-CM | POA: Diagnosis not present

## 2016-05-22 DIAGNOSIS — M25612 Stiffness of left shoulder, not elsewhere classified: Secondary | ICD-10-CM

## 2016-05-22 DIAGNOSIS — M62838 Other muscle spasm: Secondary | ICD-10-CM | POA: Diagnosis not present

## 2016-05-22 NOTE — Therapy (Signed)
[facility] PH[facility]TS [facility]IC[facility] [address] [place], [address] Phone: [phone]   Fax:  [phone]  Physical Therapy Treatment  Patient Details  Name: Claudia [name] MRN: [id number] Date of Birth: 1966-04-01 Referring Provider: Juanell Fairly MD  Encounter Date: 05/22/2016      PT End of Session - 05/22/16 1000    Visit Number 2   Number of Visits 16   Date for PT Re-Evaluation 07/12/16   PT Start Time 0855   PT Stop Time 0945   PT Time Calculation (min) 50 min   Activity Tolerance Patient tolerated treatment well;Patient limited by pain   Behavior During Therapy [facility] for tasks assessed/performed      Past Medical History:  Diagnosis Date  . Arthritis    knees, back;   . Asthma    H/O YEARS AGO-NO INHALERS  . Difficult intubation   . Dysrhythmia    H/O [facility] PROBLEMS SINCE 2013  . Rotator cuff tear    left    Past Surgical History:  Procedure Laterality Date  . CARPAL TUNNEL RELEASE     x2  . CHOLECYSTECTOMY    . COLONOSCOPY    . SHOULDER ARTHROSCOPY WITH OPEN ROTATOR CUFF REPAIR Left 10/31/2015   Procedure: SHOULDER ARTHROSCOPY, SUBACROMIAL DECOMPRESSION, BICEPS TENOTOMY AND MINI OPEN ROTATOR CUFF REPAIR;  Surgeon: Juanell Fairly, MD;  Location: [facility] ORS;  Service: Orthopedics;  Laterality: Left;  . TONSILLECTOMY      There were no vitals filed for this visit.      Subjective Assessment - 05/22/16 0858    Subjective Patient reports she is still having pain in left shoulder. She reports previous session was helpful in decreasing pain.    Limitations House hold activities;Other (comment);Lifting   Diagnostic tests had MRI which showed full thickness tear prior to surgery; post  surgery X rays and attempted MRI without results recently 03/2016   Patient Stated Goals pain relief to  be able to care for self, hair care, use left UE for housework, lifting pots, pans   Currently in Pain? Yes   Pain Score 3    Pain Location Shoulder   Pain Orientation Left   Pain Descriptors / Indicators Sore   Pain Type Chronic pain   Pain Onset More than a month ago   Pain Frequency Constant      Objective; AROM: left shoulder flexion 70 degrees, abduction 70 degrees,  IR to side of left hip AAROM: supine lying left shoulder 0-120, abduction 0-100, ER 0-80, IR across trunk Posture: hiking left shoulder, forward sitting left shoulder (unable to re position to neutral) Palpation: + spasms along left upper trapezius  Treatment: Therapeutic exercise: patient performed exercises with guidance, verbal and tactile cues and demonstration of PT: Supine lying: AAROM left shoulder forward elevation, abduction and rotations 2 x 10 each Instructed in self AAROM with clasped hands with patient performing 2 x 10 reps to forehead, top of head Rhythmic stabilization performed with shoulder in neutral position 90 degrees: x 10 reps  Instructed to perform at home with arm at 90 degrees perform small circles CW/CCW x 10 each Side lying: Scapular control elevation/depression x 10 Protraction/retraction x 10 Sitting: AAROM with clasped hands to forehead x 10  Manual therapy: STM with patient supine lying: upper trapezius trigger point techniques Side lying: scapular mobilization rotations and lateral glides x 10, upper trapezius release 3 x 20 seconds  Modalities: Electrical stimulation: russian stimulation applied to medial  border left scapula, 10/10 cycle with patient performing scapular retraction with each cycle; high volt estim., for muscle spasms (2) electrodes applied to upper trapezius and posterior aspect of left shoulder, intensity to comfort. patient seated in chair with UE's supported, moist heat applied to left shoulder during treatment x 20 min. (no adverse reactions noted)  Patient response to treatment: improved posture awareness and scapular control following demonstration and with tactile  and VC, decreased pain with elevation of left shoulder following STM and estim/moist heat. No adverse reactions to moist heat noted following treatment.  Patient education; HEP: side lying scapular control, supine lying AAROM, sitting AAROM left shoulder Patient was instructed with verbal cues, tactile cues and demonstration; handout given Patient returned demonstration with tactile and VC with good understanding verbalized      PT Long Term Goals - 05/17/16 1036      PT LONG TERM GOAL #1   Title Patient will be independent in home exercise program to improve strength/mobility for better functional independence with ADLs b 07/12/2016.    Baseline limited knowledge of appropriate pain control strategies, exercise progression for strength and ROM   Status New     PT LONG TERM GOAL #2   Title Patient will decrease Quick DASH score by > 8 points demonstrating reduced self-reported upper extremity disability every 4 weeks with <30% disabilty by 07/12/2016.    Baseline Quick dash 70%   Status New     PT LONG TERM GOAL #3   Title Patient will increase LUE shoulder AROM: sitting: Shoulder flexion >120 degrees, abduction: >120 degrees, supine: ER: >50 degrees, IR: >50 degrees for increased functional ROM with ADLs such as grooming, dressing etc by 07/12/2016   Baseline AROM left shoulder sitting: flexion 70, abduction 60, IR to side of hip, ER severe limitation with functional ROM; unable to raise to ear   Status New     PT LONG TERM GOAL #4   Title Patient will report pain level max. of 2/10 with right UE use by 07/12/2016 to allow improved function with overhead activities/personal care   Baseline pain leve max. 7/10 with using right UE for daily activities   Status New               Plan - 05/22/16 1000    Clinical Impression Statement Patient demonstrated improved soft tissue mobility, improved AROM left shoulder to 80 forward elevation and abduction with decreased pain at end of  session. She is more aware of positioning of shoulder with movement and the importance of scapular control. She should continue to improve with additional physical therapy intervention.    Rehab Potential Fair   PT Frequency 2x / week   PT Duration 8 weeks   PT Treatment/Interventions Iontophoresis 4mg /ml Dexamethasone;Electrical Stimulation;Cryotherapy;Moist Heat;Ultrasound;Patient/family education;Neuromuscular re-education;Therapeutic exercise;Manual techniques;Scar mobilization;Taping;Dry needling   PT Next Visit Plan modalities for pain control, muslce re education, ther. ex for motor control, strength, manual STM   PT Home Exercise Plan re education for scapular control, scaption for movement, pain control with ice/heat/TENS      Patient will benefit from skilled therapeutic intervention in order to improve the following deficits and impairments:  Impaired UE functional use, Increased muscle spasms, Decreased activity tolerance, Decreased knowledge of precautions, Pain, Impaired perceived functional ability, Decreased strength  Visit Diagnosis: Stiffness of left shoulder, not elsewhere classified  Muscle weakness (generalized)  Pain in left shoulder     Problem List Patient Active Problem List   Diagnosis Date Noted  .  Pulmonary hypertension (HCC) 08/16/2012  . POTS (postural orthostatic tachycardia syndrome) 08/16/2012  . Chest pain 06/26/2012  . Shortness of breath 06/26/2012  . Rapid heart beat 06/26/2012    Beacher MayBrooks, Orean Giarratano PT 05/22/2016, 10:03 AM  [place] [facility] [address] [place], [name], [id number] Phone: [phone]   Fax:  [phone]  Name: Hortencia PilarLawana A Paolillo MRN: [id number] Date of Birth: Oct 09, 1965

## 2016-05-24 ENCOUNTER — Encounter: Payer: Self-pay | Admitting: Physical Therapy

## 2016-05-24 ENCOUNTER — Ambulatory Visit: Payer: 59 | Admitting: Physical Therapy

## 2016-05-24 DIAGNOSIS — M25612 Stiffness of left shoulder, not elsewhere classified: Secondary | ICD-10-CM

## 2016-05-24 DIAGNOSIS — M6281 Muscle weakness (generalized): Secondary | ICD-10-CM | POA: Diagnosis not present

## 2016-05-24 DIAGNOSIS — M62838 Other muscle spasm: Secondary | ICD-10-CM

## 2016-05-24 DIAGNOSIS — M25512 Pain in left shoulder: Secondary | ICD-10-CM | POA: Diagnosis not present

## 2016-05-24 NOTE — Therapy (Signed)
[facility] [address] [place], [address] Phone: [phone]   Fax:  [phone]  Physical Therapy Treatment  Patient Details  Name: Claudia [name] MRN: [id number] Date of Birth: [birth year] Referring Provider: Juanell Fairly MD  Encounter Date: 05/24/2016      PT End of Session - 05/24/16 0920    Visit Number 3   Number of Visits 16   Date for PT Re-Evaluation 07/12/16   PT Start Time 0823   PT Stop Time 0916   PT Time Calculation (min) 53 min   Activity Tolerance Patient tolerated treatment well;Patient limited by pain   Behavior During Therapy [facility] for tasks assessed/performed      Past Medical History:  Diagnosis Date  . Arthritis    knees, back;   . Asthma    H/O YEARS AGO-NO INHALERS  . Difficult intubation   . Dysrhythmia    H/O [facility] PROBLEMS SINCE 2013  . Rotator cuff tear    left    Past Surgical History:  Procedure Laterality Date  . CARPAL TUNNEL RELEASE     x2  . CHOLECYSTECTOMY    . COLONOSCOPY    . SHOULDER ARTHROSCOPY WITH OPEN ROTATOR CUFF REPAIR Left 10/31/2015   Procedure: SHOULDER ARTHROSCOPY, SUBACROMIAL DECOMPRESSION, BICEPS TENOTOMY AND MINI OPEN ROTATOR CUFF REPAIR;  Surgeon: Juanell Fairly, MD;  Location: [facility] ORS;  Service: Orthopedics;  Laterality: Left;  . TONSILLECTOMY      There were no vitals filed for this visit.      Subjective Assessment - 05/24/16 0825    Subjective Patient reports having soreness in left shoulder following previous session, pain today is about the same.    Limitations House hold activities;Other (comment);Lifting   Patient Stated Goals pain relief to  be able to care for self, hair care, use left UE for housework, lifting pots, pans   Currently in Pain? Yes   Pain Score 3    Pain Location Shoulder   Pain Orientation Left   Pain Descriptors / Indicators Sore   Pain Type Chronic pain   Pain Onset More than a month ago    Pain Frequency Constant        Objective; AROM: left shoulder flexion 80 degrees, abduction 70 degrees,  IR to side of left hip AAROM: supine lying left shoulder 0-120, abduction 0-100, ER 0-80, IR across trunk Posture: hiking left shoulder, forward sitting left shoulder decreased from previous session Palpation: + spasms along left upper trapezius, decreased soft tissue mobility lats, subscapularis  Treatment: Therapeutic exercise: patient performed exercises with guidance, verbal and tactile cues and demonstration of PT: Supine lying: AAROM left shoulder forward elevation x 10 (improved FF to 130 degrees) Rhythmic stabilization performed with shoulder in neutral position 90 degrees: x 10 reps  Side lying: Scapular control elevation/depression x 10 Protraction/retraction x 10 Side lying reaching (forward elevation) 2 x 10 (manual therapy performed between sets) Side lying ER left shoulder x 10 Standing:  Door frame scapular retraction and scaption x 10 reps each   Manual therapy: Side lying: scapular mobilization rotations and lateral glides x 10, upper trapezius release 3 x 20 seconds, STM lats and subscapularis muscles Supine lying: latissimus/subscapularis release techniques  Modalities: Electrical stimulation: russian stimulation applied to medial border left scapula, 10/10 cycle with patient performing scapular retraction with each cycle; high volt estim., for muscle spasms (2) electrodes applied to upper trapezius and posterior aspect of left shoulder, intensity to comfort.  patient seated in chair with UE's supported, moist heat applied to left shoulder during treatment x 20 min. (no adverse reactions noted)  Patient response to treatment: patient demonstrated improved ability to raise left UE to 90 degrees without pain reproduction at end of session, improved flexibility in left shoulder following STM of lats, subscapularis muscles. good demonstration of exercises with  verbal cues. Decreased pain to mild/none at end of sesion following moist heat, estim.            PT Education - 05/24/16 0924    Education provided Yes   Education Details HEP: sidelying forward elevation, scaption and scapular retraction at door   Person(s) Educated Patient   Methods Explanation;Demonstration;Verbal cues;Handout   Comprehension Verbalized understanding;Returned demonstration;Verbal cues required             PT Long Term Goals - 05/17/16 1036      PT LONG TERM GOAL #1   Title Patient will be independent in home exercise program to improve strength/mobility for better functional independence with ADLs b 07/12/2016.    Baseline limited knowledge of appropriate pain control strategies, exercise progression for strength and ROM   Status New     PT LONG TERM GOAL #2   Title Patient will decrease Quick DASH score by > 8 points demonstrating reduced self-reported upper extremity disability every 4 weeks with <30% disabilty by 07/12/2016.    Baseline Quick dash 70%   Status New     PT LONG TERM GOAL #3   Title Patient will increase LUE shoulder AROM: sitting: Shoulder flexion >120 degrees, abduction: >120 degrees, supine: ER: >50 degrees, IR: >50 degrees for increased functional ROM with ADLs such as grooming, dressing etc by 07/12/2016   Baseline AROM left shoulder sitting: flexion 70, abduction 60, IR to side of hip, ER severe limitation with functional ROM; unable to raise to ear   Status New     PT LONG TERM GOAL #4   Title Patient will report pain level max. of 2/10 with right UE use by 07/12/2016 to allow improved function with overhead activities/personal care   Baseline pain leve max. 7/10 with using right UE for daily activities   Status New               Plan - 05/24/16 1610    Clinical Impression Statement Improved ability to perform elevation of left UE into scaption to 90 degrees without pain at end of session and gained 10 degrees of  elevation supine lying with exercises. Decreased spasms and improved soft tissue elasticity/mobility which allowed patient to perform exercise with less pain, stiffness.    Rehab Potential Fair   PT Frequency 2x / week   PT Duration 8 weeks   PT Treatment/Interventions Iontophoresis 4mg /ml Dexamethasone;Electrical Stimulation;Cryotherapy;Moist Heat;Ultrasound;Patient/family education;Neuromuscular re-education;Therapeutic exercise;Manual techniques;Scar mobilization;Taping;Dry needling   PT Next Visit Plan modalities for pain control, muslce re education, ther. ex for motor control, strength, manual STM   PT Home Exercise Plan re education for scapular control, scaption for movement, pain control with ice/heat/TENS      Patient will benefit from skilled therapeutic intervention in order to improve the following deficits and impairments:  Impaired UE functional use, Increased muscle spasms, Decreased activity tolerance, Decreased knowledge of precautions, Pain, Impaired perceived functional ability, Decreased strength  Visit Diagnosis: Stiffness of left shoulder, not elsewhere classified  Muscle weakness (generalized)  Pain in left shoulder  Other muscle spasm     Problem List Patient Active Problem List  Diagnosis Date Noted  . Pulmonary hypertension (HCC) 08/16/2012  . POTS (postural orthostatic tachycardia syndrome) 08/16/2012  . Chest pain 06/26/2012  . Shortness of breath 06/26/2012  . Rapid heart beat 06/26/2012    Beacher MayBrooks, Marie PT 05/24/2016, 9:25 AM  [facility] [address] [place], [name], [address] Phone: [phone]   Fax:  [phone]  Name: Hortencia PilarLawana A Burak MRN: [id number] Date of Birth: December 06, 1965

## 2016-05-29 ENCOUNTER — Ambulatory Visit: Payer: 59 | Admitting: Physical Therapy

## 2016-05-29 ENCOUNTER — Ambulatory Visit: Payer: 59 | Attending: Orthopedic Surgery | Admitting: Physical Therapy

## 2016-05-29 ENCOUNTER — Encounter: Payer: Self-pay | Admitting: Physical Therapy

## 2016-05-29 ENCOUNTER — Ambulatory Visit: Payer: 59

## 2016-05-29 DIAGNOSIS — M75112 Incomplete rotator cuff tear or rupture of left shoulder, not specified as traumatic: Secondary | ICD-10-CM | POA: Diagnosis not present

## 2016-05-29 DIAGNOSIS — M25512 Pain in left shoulder: Secondary | ICD-10-CM | POA: Insufficient documentation

## 2016-05-29 DIAGNOSIS — M25612 Stiffness of left shoulder, not elsewhere classified: Secondary | ICD-10-CM | POA: Diagnosis not present

## 2016-05-29 DIAGNOSIS — M6281 Muscle weakness (generalized): Secondary | ICD-10-CM | POA: Diagnosis not present

## 2016-05-29 NOTE — Therapy (Signed)
[facility] [address], [place], [address] Phone: [phone]   Fax:  [phone]  Physical Therapy Treatment  Patient Details  Name: Claudia [name] MRN: [id number] Date of Birth: 08/15/1966 Referring Provider: Juanell Fairly MD  Encounter Date: 05/29/2016      PT End of Session - 05/29/16 0931    Visit Number 4   Number of Visits 16   Date for PT Re-Evaluation 07/12/16   PT Start Time 0843   PT Stop Time 0931   PT Time Calculation (min) 48 min   Activity Tolerance Patient tolerated treatment well;Patient limited by pain   Behavior During Therapy [facility] for tasks assessed/performed      Past Medical History:  Diagnosis Date  . Arthritis    knees, back;   . Asthma    H/O YEARS AGO-NO INHALERS  . Difficult intubation   . Dysrhythmia    H/O [facility] PROBLEMS SINCE 2013  . Rotator cuff tear    left    Past Surgical History:  Procedure Laterality Date  . CARPAL TUNNEL RELEASE     x2  . CHOLECYSTECTOMY    . COLONOSCOPY    . SHOULDER ARTHROSCOPY WITH OPEN ROTATOR CUFF REPAIR Left 10/31/2015   Procedure: SHOULDER ARTHROSCOPY, SUBACROMIAL DECOMPRESSION, BICEPS TENOTOMY AND MINI OPEN ROTATOR CUFF REPAIR;  Surgeon: Juanell Fairly, MD;  Location: [facility];  Service: Orthopedics;  Laterality: Left;  . TONSILLECTOMY      There were no vitals filed for this visit.      Subjective Assessment - 05/29/16 0843    Subjective Patient reports her pain in left shoulder is doing better today.   Limitations House hold activities;Other (comment);Lifting   Patient Stated Goals pain relief to  be able to care for self, hair care, use left UE for housework, lifting pots, pans   Currently in Pain? Yes   Pain Score 2    Pain Location Shoulder   Pain Orientation Left   Pain Descriptors / Indicators Sore   Pain Type Chronic pain   Pain Onset More than a month ago   Pain Frequency Intermittent         Objective; AROM: left shoulder flexion 105 degrees, abduction 80 degrees, IR to side of left hip AAROM: supine lying left shoulder 0-130, abduction 0-100, ER 0-80, IR across trunk Posture: mild hiking left shoulder, forward sitting left shoulder decreased from previous session Palpation: + spasms along left upper trapezius, decreased soft tissue mobility lats, subscapularis  Treatment: Therapeutic exercise: patient performed exercises with guidance, verbal and tactile cues and demonstration of PT: Supine lying: AAROM left shoulder forward elevation x 10 (improved FF to 130 degrees) Rhythmic stabilization performed with shoulder in neutral position 90 degrees: x 10 reps  Side lying: Scapular control elevation/depression x 10 Protraction/retraction x 10 Side lying reaching (forward elevation) x 10  Side lying ER left shoulder x 10 Standing:  Door frame scapular retraction and scaption x 10 reps each  Manual therapy: Side lying: scapular mobilization rotations and lateral glides x 10, upper trapezius release 3 x 20 seconds, STM lats and subscapularis muscles  Modalities: Electrical stimulation: russian stimulation applied to medial border left scapula,10/10 cycle with patient performing scapular retraction with each cycle; high volt estim., for muscle spasms (2) electrodes applied to upper trapezius and posterior aspect of left shoulder, intensity to comfort. patient seated in chair with UE's supported, moist heat applied to left shoulder during treatment x 20 min. (  no adverse reactions noted)  Patient response to treatment: Patient demonstrated improve motor control with minimal tactile, VC today, technique and position sense improved with forward elevation exercises (decreased hiking of left shoulder and more aligned with decreased forward position) with repetition and VC. Improved scapular retraction and decreased spasms noted following modalities and STM  Pain decreased to  0/10         PT Education - 05/29/16 0845    Education provided Yes   Education Details HEP re assessed side lying exercises   Person(s) Educated Patient   Methods Explanation;Demonstration;Verbal cues   Comprehension Verbalized understanding;Returned demonstration;Verbal cues required             PT Long Term Goals - 05/17/16 1036      PT LONG TERM GOAL #1   Title Patient will be independent in home exercise program to improve strength/mobility for better functional independence with ADLs b 07/12/2016.    Baseline limited knowledge of appropriate pain control strategies, exercise progression for strength and ROM   Status New     PT LONG TERM GOAL #2   Title Patient will decrease Quick DASH score by > 8 points demonstrating reduced self-reported upper extremity disability every 4 weeks with <30% disabilty by 07/12/2016.    Baseline Quick dash 70%   Status New     PT LONG TERM GOAL #3   Title Patient will increase LUE shoulder AROM: sitting: Shoulder flexion >120 degrees, abduction: >120 degrees, supine: ER: >50 degrees, IR: >50 degrees for increased functional ROM with ADLs such as grooming, dressing etc by 07/12/2016   Baseline AROM left shoulder sitting: flexion 70, abduction 60, IR to side of hip, ER severe limitation with functional ROM; unable to raise to ear   Status New     PT LONG TERM GOAL #4   Title Patient will report pain level max. of 2/10 with right UE use by 07/12/2016 to allow improved function with overhead activities/personal care   Baseline pain leve max. 7/10 with using right UE for daily activities   Status New               Plan - 05/29/16 0932    Clinical Impression Statement Patient demonstrates good progress towards goals with improving motor control, position sense during exercises and decreasing pain with improved soft tissue mobility which is allowing her to perform more functional activities at home. She continues with pain and  weakness that limits full, normal function using left UE and will therefore require additional physical therapy intervention.     Rehab Potential Fair   PT Frequency 2x / week   PT Duration 8 weeks   PT Treatment/Interventions Iontophoresis 4mg /ml Dexamethasone;Electrical Stimulation;Cryotherapy;Moist Heat;Ultrasound;Patient/family education;Neuromuscular re-education;Therapeutic exercise;Manual techniques;Scar mobilization;Taping;Dry needling   PT Next Visit Plan modalities for pain control, muslce re education, ther. ex for motor control, strength, manual STM   PT Home Exercise Plan re education for scapular control, scaption for movement, pain control with ice/heat/TENS      Patient will benefit from skilled therapeutic intervention in order to improve the following deficits and impairments:  Impaired UE functional use, Increased muscle spasms, Decreased activity tolerance, Decreased knowledge of precautions, Pain, Impaired perceived functional ability, Decreased strength  Visit Diagnosis: Stiffness of left shoulder, not elsewhere classified  Muscle weakness (generalized)  Left shoulder pain, unspecified chronicity     Problem List Patient Active Problem List   Diagnosis Date Noted  . Pulmonary hypertension 08/16/2012  . POTS (postural orthostatic tachycardia syndrome)  08/16/2012  . Chest pain 06/26/2012  . Shortness of breath 06/26/2012  . Rapid heart beat 06/26/2012    Beacher MayBrooks, Marie PT 05/30/2016, 12:29 PM  [facility] [address] [place], [name], [address] Phone: [phone]   Fax:  [phone]  Name: Claudia [name] MRN: [id number] Date of Birth: 1966/03/11

## 2016-05-31 ENCOUNTER — Ambulatory Visit: Payer: 59

## 2016-05-31 ENCOUNTER — Encounter: Payer: 59 | Admitting: Physical Therapy

## 2016-06-03 ENCOUNTER — Ambulatory Visit: Payer: 59

## 2016-06-04 ENCOUNTER — Ambulatory Visit: Payer: 59 | Admitting: Physical Therapy

## 2016-06-04 ENCOUNTER — Encounter: Payer: Self-pay | Admitting: Physical Therapy

## 2016-06-04 DIAGNOSIS — M25612 Stiffness of left shoulder, not elsewhere classified: Secondary | ICD-10-CM

## 2016-06-04 DIAGNOSIS — M25512 Pain in left shoulder: Secondary | ICD-10-CM | POA: Diagnosis not present

## 2016-06-04 DIAGNOSIS — M6281 Muscle weakness (generalized): Secondary | ICD-10-CM

## 2016-06-04 DIAGNOSIS — M75112 Incomplete rotator cuff tear or rupture of left shoulder, not specified as traumatic: Secondary | ICD-10-CM | POA: Diagnosis not present

## 2016-06-04 NOTE — Therapy (Signed)
[place] [facility] [address], [place], [address] Phone: [phone]   Fax:  [phone]  Physical Therapy Treatment  Patient Details  Name: Claudia [name] MRN: [id number] Date of Birth: 1966/06/24 Referring Provider: Juanell Fairly MD  Encounter Date: 06/04/2016      PT End of Session - 06/04/16 0850    Visit Number 5   Number of Visits 16   Date for PT Re-Evaluation 07/12/16   PT Start Time 0756   PT Stop Time 0843   PT Time Calculation (min) 47 min   Activity Tolerance Patient tolerated treatment well   Behavior During Therapy [facility] for tasks assessed/performed      Past Medical History:  Diagnosis Date  . Arthritis    knees, back;   . Asthma    H/O YEARS AGO-NO INHALERS  . Difficult intubation   . Dysrhythmia    H/O [facility] PROBLEMS SINCE 2013  . Rotator cuff tear    left    Past Surgical History:  Procedure Laterality Date  . CARPAL TUNNEL RELEASE     x2  . CHOLECYSTECTOMY    . COLONOSCOPY    . SHOULDER ARTHROSCOPY WITH OPEN ROTATOR CUFF REPAIR Left 10/31/2015   Procedure: SHOULDER ARTHROSCOPY, SUBACROMIAL DECOMPRESSION, BICEPS TENOTOMY AND MINI OPEN ROTATOR CUFF REPAIR;  Surgeon: Juanell Fairly, MD;  Location: [facility] OR[facility];  Service: Orthopedics;  Laterality: Left;  . TONSILLECTOMY      There were no vitals filed for this visit.      Subjective Assessment - 06/04/16 0759    Subjective Patient reports her left shoulder is no worse. Her shoulder is a little painful and stiff this morning.   Limitations House hold activities;Other (comment);Lifting   Patient Stated Goals pain relief to  be able to care for self, hair care, use left UE for housework, lifting pots, pans   Currently in Pain? Yes   Pain Score 2    Pain Location Shoulder   Pain Orientation Left   Pain Descriptors / Indicators Discomfort   Pain Type Chronic pain   Pain Onset More than a month ago   Pain Frequency  Intermittent       Objective; AROM: left shoulder flexion 110 degrees, abduction 90 degrees AAROM: supine lying left shoulder 0-130, abduction 0-100, ER 0-80, IR across trunk Posture: elevated left shoulder, forward rounded left shoulder Palpation: + spasms along left upper trapezius, decreased soft tissue mobility lats, subscapularis  Treatment: Therapeutic exercise: patient performed exercises with guidance, verbal and tactile cues and demonstration of PT: Supine lying: AAROM left shoulder forward elevation 3 x 10  Rhythmic stabilization performed with shoulder in neutral position 90 degrees: x 10 reps  Side lying: Scapular control elevation/depression x 10 Protraction/retraction x 10  Side lying ER left shoulder x 10 Standing:  Door frame scapular retraction and scaption x 10 reps each  Manual therapy: Side lying: scapular mobilization rotations and lateral glides x 10, upper trapezius release 3 x 20 seconds, STM superficial techniques for lats and subscapularis muscles  Modalities: Electrical stimulation: russian stimulation applied to medial border left scapula,10/10 cycle with patient performing scapular retraction with each cycle; high volt estim., for muscle spasms (2) electrodes applied to upper trapezius and posterior aspect of left shoulder, intensity to comfort. patient seated in chair with UE's supported, moist heat applied to left shoulder during treatment x 20 min. (no adverse reactions noted)  Patient response to treatment: Patient with improved motor control for  scapular elevation, depression and protraction, retraction. Improved soft tissue elasticity in lats. Which allowed improved forward elevation supine lying. Patient more aware of shoulder positioning following VC and repetition of exercises. Improved posture at end of session following estim and moist heat.          PT Education - 06/04/16 0801    Education provided Yes   Education Details HEP: re  assessed side lying exercises, educated in proper posture with left shoulder held in more neutral position, not forward   Person(s) Educated Patient   Methods Explanation;Demonstration;Verbal cues   Comprehension Verbalized understanding;Returned demonstration;Verbal cues required             PT Long Term Goals - 05/17/16 1036      PT LONG TERM GOAL #1   Title Patient will be independent in home exercise program to improve strength/mobility for better functional independence with ADLs b 07/12/2016.    Baseline limited knowledge of appropriate pain control strategies, exercise progression for strength and ROM   Status New     PT LONG TERM GOAL #2   Title Patient will decrease Quick DASH score by > 8 points demonstrating reduced self-reported upper extremity disability every 4 weeks with <30% disabilty by 07/12/2016.    Baseline Quick dash 70%   Status New     PT LONG TERM GOAL #3   Title Patient will increase LUE shoulder AROM: sitting: Shoulder flexion >120 degrees, abduction: >120 degrees, supine: ER: >50 degrees, IR: >50 degrees for increased functional ROM with ADLs such as grooming, dressing etc by 07/12/2016   Baseline AROM left shoulder sitting: flexion 70, abduction 60, IR to side of hip, ER severe limitation with functional ROM; unable to raise to ear   Status New     PT LONG TERM GOAL #4   Title Patient will report pain level max. of 2/10 with right UE use by 07/12/2016 to allow improved function with overhead activities/personal care   Baseline pain leve max. 7/10 with using right UE for daily activities   Status New               Plan - 06/04/16 1610    Clinical Impression Statement Patient with improved ROM and soft tissue elasticity with treatment. She continues with left shoulder pain and stiffness that requires additional physical therapy intervention in order to return to PLOF.   Rehab Potential Fair       PT Frequency 2x / week   PT Duration 8 weeks    PT Treatment/Interventions Iontophoresis 4mg /ml Dexamethasone;Electrical Stimulation;Cryotherapy;Moist Heat;Ultrasound;Patient/family education;Neuromuscular re-education;Therapeutic exercise;Manual techniques;Scar mobilization;Taping;Dry needling   PT Next Visit Plan modalities for pain control, muslce re education, ther. ex for motor control, strength, manual STM   PT Home Exercise Plan re education for scapular control, scaption for movement, pain control with ice/heat/TENS      Patient will benefit from skilled therapeutic intervention in order to improve the following deficits and impairments:  Impaired UE functional use, Increased muscle spasms, Decreased activity tolerance, Decreased knowledge of precautions, Pain, Impaired perceived functional ability, Decreased strength  Visit Diagnosis: Stiffness of left shoulder, not elsewhere classified  Muscle weakness (generalized)  Left shoulder pain, unspecified chronicity     Problem List Patient Active Problem List   Diagnosis Date Noted  . Pulmonary hypertension 08/16/2012  . POTS (postural orthostatic tachycardia syndrome) 08/16/2012  . Chest pain 06/26/2012  . Shortness of breath 06/26/2012  . Rapid heart beat 06/26/2012    Beacher May PT 06/04/2016, 5:07  PM  [facility] [address] [place], [name], [address] Phone: [phone]   Fax:  [phone]  Name: Claudia [name] MRN: [id number] Date of Birth: 11/10/1965

## 2016-06-06 ENCOUNTER — Ambulatory Visit: Payer: 59 | Admitting: Physical Therapy

## 2016-06-06 ENCOUNTER — Encounter: Payer: Self-pay | Admitting: Physical Therapy

## 2016-06-06 DIAGNOSIS — M25612 Stiffness of left shoulder, not elsewhere classified: Secondary | ICD-10-CM | POA: Diagnosis not present

## 2016-06-06 DIAGNOSIS — M6281 Muscle weakness (generalized): Secondary | ICD-10-CM

## 2016-06-06 DIAGNOSIS — M25512 Pain in left shoulder: Secondary | ICD-10-CM | POA: Diagnosis not present

## 2016-06-06 DIAGNOSIS — M75112 Incomplete rotator cuff tear or rupture of left shoulder, not specified as traumatic: Secondary | ICD-10-CM | POA: Diagnosis not present

## 2016-06-07 NOTE — Therapy (Signed)
[facility] [address], [place], [address] Phone: [phone]   Fax:  [phone]  Physical Therapy Treatment  Patient Details  Name: Claudia [name] MRN: [id number] Date of Birth: 08/15/1966 Referring Provider: Juanell Fairly MD  Encounter Date: 06/06/2016      PT End of Session - 06/06/16 0850    Visit Number 6   Number of Visits 16   Date for PT Re-Evaluation 07/12/16   PT Start Time 0845   PT Stop Time 0930   PT Time Calculation (min) 45 min   Activity Tolerance Patient tolerated treatment well   Behavior During Therapy [facility] for tasks assessed/performed      Past Medical History:  Diagnosis Date  . Arthritis    knees, back;   . Asthma    H/O YEARS AGO-NO INHALERS  . Difficult intubation   . Dysrhythmia    H/O [facility] PROBLEMS SINCE 2013  . Rotator cuff tear    left    Past Surgical History:  Procedure Laterality Date  . CARPAL TUNNEL RELEASE     x2  . CHOLECYSTECTOMY    . COLONOSCOPY    . SHOULDER ARTHROSCOPY WITH OPEN ROTATOR CUFF REPAIR Left 10/31/2015   Procedure: SHOULDER ARTHROSCOPY, SUBACROMIAL DECOMPRESSION, BICEPS TENOTOMY AND MINI OPEN ROTATOR CUFF REPAIR;  Surgeon: Juanell Fairly, MD;  Location: [facility] ORS;  Service: Orthopedics;  Laterality: Left;  . TONSILLECTOMY      There were no vitals filed for this visit.      Subjective Assessment - 06/06/16 0848    Subjective Pain is about the same in left shoulder and has increased pain with laying on left side.    Limitations House hold activities;Other (comment);Lifting   Patient Stated Goals pain relief to  be able to care for self, hair care, use left UE for housework, lifting pots, pans   Currently in Pain? Yes  reports waking up with pain and swelling this morning due to laying on that side   Pain Score 2    Pain Location Shoulder   Pain Orientation Left   Pain Descriptors / Indicators Discomfort   Pain Type  Chronic pain   Pain Onset More than a month ago        Objective; AROM: left shoulder flexion 110degrees, abduction 90 degrees with stiffness/pain limiting further motion AAROM: supine lying left shoulder 0-130, abduction 0-100, ER 0-80 (scapular plane), IR across trunk Posture: elevated left shoulder, forward rounded left shoulder, no swelling noted in upper arm Palpation: + spasms along left upper trapezius, decreased soft tissue mobility lats, subscapularis  Treatment: Therapeutic exercise: patient performed exercises with guidance, verbal and tactile cues and demonstration of PT: Supine lying: AAROM left shoulder forward elevation 3 x 10  Rhythmic stabilization performed with shoulder in neutral position 90 degrees: x 10 reps  Side lying: Scapular control elevation/depression x 10 Protraction/retraction x 10  Side lying ER left shoulder x 10 with hold at end range and controlled eccentric lowering Standing:  UE ranger with 2# above elbow forward/back x 1 min,. Rotations x 1 min. With assistance of therapist to control alignment of shoulder and perform correct activation of scapular muscles  Manual therapy: Side lying: scapular mobilization rotations and lateral glides x 10, upper trapezius release 3 x 20 seconds, STM superficial techniques for lats and subscapularis muscles  Modalities: Electrical stimulation: russian stimulation applied to medial border left scapula,10/10 cycle with patient performing scapular retraction with each cycle; high  volt estim., for muscle spasms (2) electrodes applied to upper trapezius and posterior aspect of left shoulder, intensity to comfort. patient seated in chair with UE's supported, moist heat applied to left shoulder during treatment x 20 min. (no adverse reactions noted)  Patient response to treatment: Patient with improved strength and decreased pain with exercises as compared to previous session. Improved soft tissue elasticity in left  shoulder musculature, allowing improved pain free ROM into forward elevation and ER in side lying. Improved posture with decreased hiking of left shoulder at end of sessionI         PT Education - 06/06/16 0945    Education provided Yes   Education Details HEP: continue with exercises as instructed; scapular control exercises   Person(s) Educated Patient   Methods Explanation;Demonstration;Verbal cues   Comprehension Verbalized understanding;Returned demonstration;Verbal cues required             PT Long Term Goals - 05/17/16 1036      PT LONG TERM GOAL #1   Title Patient will be independent in home exercise program to improve strength/mobility for better functional independence with ADLs b 07/12/2016.    Baseline limited knowledge of appropriate pain control strategies, exercise progression for strength and ROM   Status New     PT LONG TERM GOAL #2   Title Patient will decrease Quick DASH score by > 8 points demonstrating reduced self-reported upper extremity disability every 4 weeks with <30% disabilty by 07/12/2016.    Baseline Quick dash 70%   Status New     PT LONG TERM GOAL #3   Title Patient will increase LUE shoulder AROM: sitting: Shoulder flexion >120 degrees, abduction: >120 degrees, supine: ER: >50 degrees, IR: >50 degrees for increased functional ROM with ADLs such as grooming, dressing etc by 07/12/2016   Baseline AROM left shoulder sitting: flexion 70, abduction 60, IR to side of hip, ER severe limitation with functional ROM; unable to raise to ear   Status New     PT LONG TERM GOAL #4   Title Patient will report pain level max. of 2/10 with right UE use by 07/12/2016 to allow improved function with overhead activities/personal care   Baseline pain leve max. 7/10 with using right UE for daily activities   Status New               Plan - 06/06/16 0981    Clinical Impression Statement Patient with improving soft tissue elasticity and pain. She is  progressing well and slowly with goals due to chronicity of pain and should continue to progress with additional physical therapy intervention.    Rehab Potential Fair   PT Frequency 2x / week   PT Duration 8 weeks   PT Treatment/Interventions Iontophoresis 4mg /ml Dexamethasone;Electrical Stimulation;Cryotherapy;Moist Heat;Ultrasound;Patient/family education;Neuromuscular re-education;Therapeutic exercise;Manual techniques;Scar mobilization;Taping;Dry needling   PT Next Visit Plan modalities for pain control, muslce re education, ther. ex for motor control, strength, manual STM   PT Home Exercise Plan re education for scapular control, scaption for movement, pain control with ice/heat/TENS      Patient will benefit from skilled therapeutic intervention in order to improve the following deficits and impairments:  Impaired UE functional use, Increased muscle spasms, Decreased activity tolerance, Decreased knowledge of precautions, Pain, Impaired perceived functional ability, Decreased strength  Visit Diagnosis: Stiffness of left shoulder, not elsewhere classified  Muscle weakness (generalized)  Left shoulder pain, unspecified chronicity     Problem List Patient Active Problem List   Diagnosis Date Noted  .  Pulmonary hypertension 08/16/2012  . POTS (postural orthostatic tachycardia syndrome) 08/16/2012  . Chest pain 06/26/2012  . Shortness of breath 06/26/2012  . Rapid heart beat 06/26/2012    Beacher MayBrooks, Khiana Camino PT 06/07/2016, 1:42 PM  [facility] [address] [place], [name], [address] Phone: [phone]   Fax:  [phone]  Name: Hortencia PilarLawana A Weisberg MRN: [id number] Date of Birth: 30-May-1966

## 2016-06-10 ENCOUNTER — Ambulatory Visit: Payer: 59

## 2016-06-11 ENCOUNTER — Encounter: Payer: Self-pay | Admitting: Physical Therapy

## 2016-06-11 ENCOUNTER — Ambulatory Visit: Payer: 59 | Admitting: Physical Therapy

## 2016-06-11 DIAGNOSIS — M25612 Stiffness of left shoulder, not elsewhere classified: Secondary | ICD-10-CM | POA: Diagnosis not present

## 2016-06-11 DIAGNOSIS — M6281 Muscle weakness (generalized): Secondary | ICD-10-CM | POA: Diagnosis not present

## 2016-06-11 DIAGNOSIS — M25512 Pain in left shoulder: Secondary | ICD-10-CM | POA: Diagnosis not present

## 2016-06-11 DIAGNOSIS — M75112 Incomplete rotator cuff tear or rupture of left shoulder, not specified as traumatic: Secondary | ICD-10-CM | POA: Diagnosis not present

## 2016-06-11 NOTE — Therapy (Signed)
[facility] [address], [place], [address] Phone: [phone]   Fax:  [phone]  Physical Therapy Treatment  Patient Details  Name: Claudia [name] MRN: [id number] Date of Birth: Apr 12, 1966 Referring Provider: Juanell Fairly MD  Encounter Date: 06/11/2016      PT End of Session - 06/11/16 0900    Visit Number 7   Number of Visits 16   Date for PT Re-Evaluation 07/12/16   PT Start Time 0755   PT Stop Time 0855   PT Time Calculation (min) 60 min   Activity Tolerance Patient tolerated treatment well   Behavior During Therapy [facility] for tasks assessed/performed      Past Medical History:  Diagnosis Date  . Arthritis    knees, back;   . Asthma    H/O YEARS AGO-NO INHALERS  . Difficult intubation   . Dysrhythmia    H/O [facility] PROBLEMS SINCE 2013  . Rotator cuff tear    left    Past Surgical History:  Procedure Laterality Date  . CARPAL TUNNEL RELEASE     x2  . CHOLECYSTECTOMY    . COLONOSCOPY    . SHOULDER ARTHROSCOPY WITH OPEN ROTATOR CUFF REPAIR Left 10/31/2015   Procedure: SHOULDER ARTHROSCOPY, SUBACROMIAL DECOMPRESSION, BICEPS TENOTOMY AND MINI OPEN ROTATOR CUFF REPAIR;  Surgeon: Juanell Fairly, MD;  Location: [facility] ORS;  Service: Orthopedics;  Laterality: Left;  . TONSILLECTOMY      There were no vitals filed for this visit.      Subjective Assessment - 06/11/16 0759    Subjective Patient reports she is improving with less pain and able to use left arm more automatically and still has a few positions if she moves too fast it hurts.    Limitations House hold activities;Other (comment);Lifting   Patient Stated Goals pain relief to  be able to care for self, hair care, use left UE for housework, lifting pots, pans   Currently in Pain? Yes   Pain Score 2    Pain Location Shoulder   Pain Orientation Left   Pain Descriptors / Indicators Discomfort   Pain Type Chronic pain   Pain  Onset More than a month ago   Pain Frequency Intermittent     Objective:  Sitting/standing:  AROM left shoulder 0-110 flexion Supine lying: AAROM: left shoulder flexion 0-130 with stiffness reported at end range Palpation: decreased soft tissue elasticity left shoulder pectoral muscles, lats, subscapularis,   Treatment: Therapeutic exercise: patient performed exercises with verbal, tactile cues  demonstration of therapist: Sitting/standing: OMEGA; Resistive scapular rows 10# supinated forearms x 15 reps Straight arm pull downs standing: 10# x 15 reps Standing single arm row right UE 10#, left UE 5# x 15 reps each Instructed in use of green resistive band for home: Scapular retraction through short arc x 15 reps Shoulder extension to hips x 15 reps Double band single arm row in standing x 10 reps each LE  Manual therapy:  Supine lying pectoral and subscapularis STM superficial and deep techniques: goal improve ROM, decrease spasms  Modalities: Electrical stimulation: russian stimulation applied to medial border left scapula,10/10 cycle with patient performing scapular retraction with each cycle; high volt estim., for muscle spasms (2) electrodes applied to anterior and posterior aspect of left shoulder, intensity to comfort. patient seated in chair with UE's supported, moist heat applied to left shoulder during treatment x 20 min. (no adverse reactions noted)  Patient response to treatment: patient demonstrated improved  technique with exercises with minimal VC for correct alignment of trunk, stance and shoulder. Patient with decreased spasms by 50% following STM with improved flexibility into shoulder flexion, scaption. Improved motor control with repetition and cuing; decreased soreness reported following estim.         PT Education - 06/11/16 0900    Education provided Yes   Education Details HEP: green resistive band scapular retraction, single arm and both arms, shoulder  extension to hips   Person(s) Educated Patient   Methods Explanation;Demonstration;Verbal cues   Comprehension Verbalized understanding;Verbal cues required;Returned demonstration             PT Long Term Goals - 05/17/16 1036      PT LONG TERM GOAL #1   Title Patient will be independent in home exercise program to improve strength/mobility for better functional independence with ADLs b 07/12/2016.    Baseline limited knowledge of appropriate pain control strategies, exercise progression for strength and ROM   Status New     PT LONG TERM GOAL #2   Title Patient will decrease Quick DASH score by > 8 points demonstrating reduced self-reported upper extremity disability every 4 weeks with <30% disabilty by 07/12/2016.    Baseline Quick dash 70%   Status New     PT LONG TERM GOAL #3   Title Patient will increase LUE shoulder AROM: sitting: Shoulder flexion >120 degrees, abduction: >120 degrees, supine: ER: >50 degrees, IR: >50 degrees for increased functional ROM with ADLs such as grooming, dressing etc by 07/12/2016   Baseline AROM left shoulder sitting: flexion 70, abduction 60, IR to side of hip, ER severe limitation with functional ROM; unable to raise to ear   Status New     PT LONG TERM GOAL #4   Title Patient will report pain level max. of 2/10 with right UE use by 07/12/2016 to allow improved function with overhead activities/personal care   Baseline pain leve max. 7/10 with using right UE for daily activities   Status New               Plan - 06/11/16 0901    Clinical Impression Statement Patient is progressing well with ability to progress strengthening exercises without increased pain in left shoulder. She continues with stiffness due to decreased soft tissue  mobility and mild pain in left shoulder and decreased strength. She will benefit form continued physical therapy intervention to achive maximal function with left UE.    Rehab Potential Fair   PT Frequency  2x / week   PT Duration 8 weeks   PT Treatment/Interventions Iontophoresis 4mg /ml Dexamethasone;Electrical Stimulation;Cryotherapy;Moist Heat;Ultrasound;Patient/family education;Neuromuscular re-education;Therapeutic exercise;Manual techniques;Scar mobilization;Taping;Dry needling   PT Next Visit Plan modalities for pain control, muslce re education, ther. ex for motor control, strength, manual STM   PT Home Exercise Plan re education for scapular control, scaption for movement, pain control with ice/heat/TENS      Patient will benefit from skilled therapeutic intervention in order to improve the following deficits and impairments:  Impaired UE functional use, Increased muscle spasms, Decreased activity tolerance, Decreased knowledge of precautions, Pain, Impaired perceived functional ability, Decreased strength  Visit Diagnosis: Stiffness of left shoulder, not elsewhere classified  Muscle weakness (generalized)  Left shoulder pain, unspecified chronicity     Problem List Patient Active Problem List   Diagnosis Date Noted  . Pulmonary hypertension 08/16/2012  . POTS (postural orthostatic tachycardia syndrome) 08/16/2012  . Chest pain 06/26/2012  . Shortness of breath 06/26/2012  . Rapid  heart beat 06/26/2012    Beacher MayBrooks, Marie PT 06/11/2016, 9:03 PM  [facility] [address] [place], [name], [address] Phone: [phone]   Fax:  [phone]  Name: Claudia [name] MRN: [id number] Date of Birth: 1966-02-26

## 2016-06-13 ENCOUNTER — Ambulatory Visit: Payer: 59 | Admitting: Physical Therapy

## 2016-06-13 ENCOUNTER — Ambulatory Visit
Admission: RE | Admit: 2016-06-13 | Discharge: 2016-06-13 | Disposition: A | Discharge status: Home / Self Care | Payer: 59 | Source: Ambulatory Visit | Attending: Orthopedic Surgery | Admitting: Orthopedic Surgery

## 2016-06-13 ENCOUNTER — Encounter: Payer: Self-pay | Admitting: Physical Therapy

## 2016-06-13 DIAGNOSIS — M25512 Pain in left shoulder: Secondary | ICD-10-CM

## 2016-06-13 DIAGNOSIS — M6281 Muscle weakness (generalized): Secondary | ICD-10-CM | POA: Diagnosis not present

## 2016-06-13 DIAGNOSIS — M75112 Incomplete rotator cuff tear or rupture of left shoulder, not specified as traumatic: Secondary | ICD-10-CM | POA: Diagnosis not present

## 2016-06-13 DIAGNOSIS — M25612 Stiffness of left shoulder, not elsewhere classified: Secondary | ICD-10-CM

## 2016-06-13 DIAGNOSIS — S46012A Strain of muscle(s) and tendon(s) of the rotator cuff of left shoulder, initial encounter: Secondary | ICD-10-CM | POA: Diagnosis not present

## 2016-06-13 MED ORDER — LIDOCAINE HCL (PF) 1 % IJ SOLN
5.0000 mL | Freq: Once | INTRAMUSCULAR | Status: AC
  Administered 2016-06-13: 5 mL
  Filled 2016-06-13: qty 5

## 2016-06-13 MED ORDER — SODIUM CHLORIDE 0.9 % IJ SOLN: 10.0000 mL | INTRAMUSCULAR | Status: DC | PRN

## 2016-06-13 MED ORDER — IOPAMIDOL (ISOVUE-300) INJECTION 61%
10.0000 mL | Freq: Once | INTRAVENOUS | Status: AC | PRN
  Administered 2016-06-13: 10 mL via INTRAVENOUS

## 2016-06-14 NOTE — Therapy (Signed)
[facility]ICINE [address], [place], [address] Phone: [phone]   Fax:  [phone]  Physical Therapy Treatment  Patient Details  Name: Claudia [name] MRN: [id number] Date of Birth: 11/14/1965 Referring Provider: Juanell Fairly MD  Encounter Date: 06/13/2016      PT End of Session - 06/13/16 0900    Visit Number 8   Number of Visits 16   Date for PT Re-Evaluation 07/12/16   PT Start Time 0800   PT Stop Time 0847   PT Time Calculation (min) 47 min   Activity Tolerance Patient tolerated treatment well   Behavior During Therapy [facility] for tasks assessed/performed      Past Medical History:  Diagnosis Date  . Arthritis    knees, back;   . Asthma    H/O YEARS AGO-NO INHALERS  . Difficult intubation   . Dysrhythmia    H/O [facility] PROBLEMS SINCE 2013  . Rotator cuff tear    left    Past Surgical History:  Procedure Laterality Date  . CARPAL TUNNEL RELEASE     x2  . CHOLECYSTECTOMY    . COLONOSCOPY    . SHOULDER ARTHROSCOPY WITH OPEN ROTATOR CUFF REPAIR Left 10/31/2015   Procedure: SHOULDER ARTHROSCOPY, SUBACROMIAL DECOMPRESSION, BICEPS TENOTOMY AND MINI OPEN ROTATOR CUFF REPAIR;  Surgeon: Juanell Fairly, MD;  Location: [facility] ORS;  Service: Orthopedics;  Laterality: Left;  . TONSILLECTOMY      There were no vitals filed for this visit.      Subjective Assessment - 06/13/16 0809    Subjective Patient reports she is little sore in right shoulder:    Limitations House hold activities;Other (comment);Lifting   Patient Stated Goals pain relief to  be able to care for self, hair care, use left UE for housework, lifting pots, pans   Currently in Pain? Yes   Pain Score 2    Pain Location Shoulder   Pain Orientation Left   Pain Descriptors / Indicators Discomfort   Pain Type Chronic pain   Pain Onset More than a month ago   Pain Frequency Intermittent      Objective:  Sitting/standing:  AROM left shoulder standing flexion 0-110 Supine lying: AAROM: left shoulder flexion 0-130 with stiffness reported at end range; abduction 100 Palpation: decreased soft tissue elasticity left shoulder pectoral muscles, lats, subscapularis, RTC posterior aspec/t left shoulder  Treatment: Therapeutic exercise: patient performed exercises with verbal, tactile cues  demonstration of therapist: Sitting/standing: OMEGA; Seated resistive scapular rows 10# supinated forearms x 15 reps Straight arm pull downs standing: 10# x 15 reps Standing single arm row right and left UE 10#, left UE Standing UE Ranger for forward flexion within available ROM: pain/strain end range of motion Supine lying: Rhythmic stabilization at 90 degrees flexion 2 x 10 reps AAROM/holding 3# dumbbell forward flexion x 10 reps with assistance of therapist (pain limited exercise) Instructed patient (demonstration) in MFR technique prone lying with light weight held in hand 2-5 min. Daily  Manual therapy:  Supine lying: left shoulder: posterior aspect RTC muscles, pectoral and subscapularis STM superficial and deep techniques: goal improve ROM, decrease spasms  Modalities: Electrical stimulation: russian stimulation applied to medial border left scapula,10/10 cycle with patient performing scapular retraction with each cycle; high volt estim., for muscle spasms (2) electrodes applied to anterior and posterior aspect of left shoulder, intensity to comfort. patient seated in chair with UE's supported, moist heat applied to left shoulder during treatment x 20  min. (no adverse reactions noted)  Patient response to treatment: Patient response to treatment: patient demonstrated improved technique with exercises with minimal VC for correct alignment. No increased pain with exercises, stretch felt at end ranges of flexion and abduction. Patient with decreased spasms by 50% following STM. Improved motor control with repetition and cuing,  following estim.             PT Education - 06/13/16 0855    Education provided Yes   Education Details HEP: add stretching, MFR prone lying wiht right arm hanging with 3# weight , continue with scapular exercises and strengthening and ROM exercies   Person(s) Educated Patient   Methods Explanation;Demonstration;Verbal cues   Comprehension Verbalized understanding;Returned demonstration;Verbal cues required             PT Long Term Goals - 05/17/16 1036      PT LONG TERM GOAL #1   Title Patient will be independent in home exercise program to improve strength/mobility for better functional independence with ADLs b 07/12/2016.    Baseline limited knowledge of appropriate pain control strategies, exercise progression for strength and ROM   Status New     PT LONG TERM GOAL #2   Title Patient will decrease Quick DASH score by > 8 points demonstrating reduced self-reported upper extremity disability every 4 weeks with <30% disabilty by 07/12/2016.    Baseline Quick dash 70%   Status New     PT LONG TERM GOAL #3   Title Patient will increase LUE shoulder AROM: sitting: Shoulder flexion >120 degrees, abduction: >120 degrees, supine: ER: >50 degrees, IR: >50 degrees for increased functional ROM with ADLs such as grooming, dressing etc by 07/12/2016   Baseline AROM left shoulder sitting: flexion 70, abduction 60, IR to side of hip, ER severe limitation with functional ROM; unable to raise to ear   Status New     PT LONG TERM GOAL #4   Title Patient will report pain level max. of 2/10 with right UE use by 07/12/2016 to allow improved function with overhead activities/personal care   Baseline pain leve max. 7/10 with using right UE for daily activities   Status New               Plan - 06/13/16 0900    Clinical Impression Statement Patient is progressing well with improvement in strength and ROM left UE. She is able to perform exercises with minimal to no discomfort in  left shoulder and reports stiffness and weakness as primary limiting factors. She continues with functional limitations of decreased strength and ROM left shoulder and will require additional physical therapy intervention to achieve maximal functional use left UE.   Rehab Potential Fair   PT Frequency 2x / week   PT Duration 8 weeks   PT Treatment/Interventions Iontophoresis 4mg /ml Dexamethasone;Electrical Stimulation;Cryotherapy;Moist Heat;Ultrasound;Patient/family education;Neuromuscular re-education;Therapeutic exercise;Manual techniques;Scar mobilization;Taping;Dry needling   PT Next Visit Plan modalities for pain control, muslce re education, ther. ex for motor control, strength, manual STM   PT Home Exercise Plan re education for scapular control, scaption for movement, pain control with ice/heat/TENS      Patient will benefit from skilled therapeutic intervention in order to improve the following deficits and impairments:  Impaired UE functional use, Increased muscle spasms, Decreased activity tolerance, Decreased knowledge of precautions, Pain, Impaired perceived functional ability, Decreased strength  Visit Diagnosis: Stiffness of left shoulder, not elsewhere classified  Muscle weakness (generalized)  Left shoulder pain, unspecified chronicity     Problem  List Patient Active Problem List   Diagnosis Date Noted  . Pulmonary hypertension 08/16/2012  . POTS (postural orthostatic tachycardia syndrome) 08/16/2012  . Chest pain 06/26/2012  . Shortness of breath 06/26/2012  . Rapid heart beat 06/26/2012    Beacher MayBrooks, Marie PT 06/14/2016, 1:45 PM  [facility] [address] [place], [name], [address] Phone: [phone]   Fax:  [phone]  Name: Claudia [name] MRN: [id number] Date of Birth: 1966/06/18

## 2016-06-18 ENCOUNTER — Encounter: Payer: Self-pay | Admitting: Physical Therapy

## 2016-06-18 ENCOUNTER — Ambulatory Visit: Payer: 59 | Admitting: Physical Therapy

## 2016-06-18 DIAGNOSIS — M75112 Incomplete rotator cuff tear or rupture of left shoulder, not specified as traumatic: Secondary | ICD-10-CM | POA: Diagnosis not present

## 2016-06-18 DIAGNOSIS — M6281 Muscle weakness (generalized): Secondary | ICD-10-CM | POA: Diagnosis not present

## 2016-06-18 DIAGNOSIS — M25512 Pain in left shoulder: Secondary | ICD-10-CM | POA: Diagnosis not present

## 2016-06-18 DIAGNOSIS — M25612 Stiffness of left shoulder, not elsewhere classified: Secondary | ICD-10-CM | POA: Diagnosis not present

## 2016-06-19 NOTE — Therapy (Signed)
[place] [facility] [address] [place], [address] Phone: [phone]   Fax:  [phone]  Physical Therapy Treatment  Patient Details  Name: Claudia [name] MRN: [id number] Date of Birth: [birth year] Referring Provider: Juanell Fairly MD  Encounter Date: 06/18/2016      PT End of Session - 06/18/16 0845    Visit Number 9   Number of Visits 16   Date for PT Re-Evaluation 07/12/16   PT Start Time 0757   PT Stop Time 0845   PT Time Calculation (min) 48 min   Activity Tolerance Patient tolerated treatment well   Behavior During Therapy [facility] for tasks assessed/performed      Past Medical History:  Diagnosis Date  . Arthritis    knees, back;   . Asthma    H/O YEARS AGO-NO INHALERS  . Difficult intubation   . Dysrhythmia    H/O [facility] PROBLEMS SINCE 2013  . Rotator cuff tear    left    Past Surgical History:  Procedure Laterality Date  . CARPAL TUNNEL RELEASE     x2  . CHOLECYSTECTOMY    . COLONOSCOPY    . SHOULDER ARTHROSCOPY WITH OPEN ROTATOR CUFF REPAIR Left 10/31/2015   Procedure: SHOULDER ARTHROSCOPY, SUBACROMIAL DECOMPRESSION, BICEPS TENOTOMY AND MINI OPEN ROTATOR CUFF REPAIR;  Surgeon: Juanell Fairly, MD;  Location: [facility] ORS;  Service: Orthopedics;  Laterality: Left;  . TONSILLECTOMY      There were no vitals filed for this visit.      Subjective Assessment - 06/18/16 0800    Subjective Patient reports she is little better with left shoulder pain. She had an arthrogram last week and is to meet with MD regarding results .   Limitations House hold activities;Other (comment);Lifting   Patient Stated Goals pain relief to  be able to care for self, hair care, use left UE for housework, lifting pots, pans   Currently in Pain? Yes   Pain Score 2    Pain Location Shoulder   Pain Orientation Left   Pain Descriptors / Indicators Discomfort   Pain Type Chronic pain   Pain Onset More than a  month ago   Pain Frequency Intermittent      Objective:  Sitting/standing: AROM left shoulder standing flexion 0-115 Supine lying: AAROM: left shoulder flexion 0-130 with stiffness reported at end range; abduction 100 Palpation: decreased soft tissue elasticity left shoulder pectoral muscles, lats, subscapularis, RTC posterior aspec/t left shoulder  Treatment: Therapeutic exercise: patient performed exercises with verbal, tactile cues demonstration of therapist: Prior to exercises instructed patient in anatomy of shoulder and tendons involved in RTC impingement Supine lying: Rhythmic stabilization at 90 degrees flexion 2 x 10 reps AAROM left shoulder forward elevation, rotation with isometric rotations performed with manual resistance x 10 reps  Scapular retraction at door x 10 reps ROM for scaption, abduction with assistance x 10 reps  Manual therapy:  Supine lying: left shoulder: posterior aspect RTC muscles, pectoral and subscapularis STM superficial and deep techniques: goal improve ROM, decrease spasms  Modalities: Electrical stimulation: russian stimulation applied to medial border left scapula,10/10 cycle with patient performing scapular retraction with each cycle; high volt estim., for muscle spasms (2) electrodes applied to upper trapeziusand posterior aspect of left shoulder, intensity to comfort. patient seated in chair with UE's supported, moist heat applied to left shoulder during treatment x 20 min. (no adverse reactions noted)  Patient response to treatment:Patient able to perform exercises with assistance  and minimal cuing. She demonstrated improved soft tissue elasticity following STM, demonstrated improved motor control with repetition and cuing following estim. verbalized good understanding of home program          PT Education - 06/18/16 845    Education provided Yes   Education Details HEP: continue with exercises for scapular retraction, ROM of shoulder,  pain control; instructed in anatomy of shoulder and impingement mechanics   Person(s) Educated Patient   Methods Explanation;Demonstration;Verbal cues; anatomy model   Comprehension Verbalized understanding;Returned demonstration;Verbal cues required             PT Long Term Goals - 05/17/16 1036      PT LONG TERM GOAL #1   Title Patient will be independent in home exercise program to improve strength/mobility for better functional independence with ADLs b 07/12/2016.    Baseline limited knowledge of appropriate pain control strategies, exercise progression for strength and ROM   Status New     PT LONG TERM GOAL #2   Title Patient will decrease Quick DASH score by > 8 points demonstrating reduced self-reported upper extremity disability every 4 weeks with <30% disabilty by 07/12/2016.    Baseline Quick dash 70%   Status New     PT LONG TERM GOAL #3   Title Patient will increase LUE shoulder AROM: sitting: Shoulder flexion >120 degrees, abduction: >120 degrees, supine: ER: >50 degrees, IR: >50 degrees for increased functional ROM with ADLs such as grooming, dressing etc by 07/12/2016   Baseline AROM left shoulder sitting: flexion 70, abduction 60, IR to side of hip, ER severe limitation with functional ROM; unable to raise to ear   Status New     PT LONG TERM GOAL #4   Title Patient will report pain level max. of 2/10 with right UE use by 07/12/2016 to allow improved function with overhead activities/personal care   Baseline pain leve max. 7/10 with using right UE for daily activities   Status New               Plan - 06/18/16 0847    Clinical Impression Statement Patient tolerated session well with decreased pain in left shoulder, improving scapular control with exercises. She continues with pain with overhead motions and weakness in left UE and will therefore require additional physical therapy intervention to achieve goals.   Rehab Potential Fair   PT Frequency 2x /  week   PT Duration 8 weeks   PT Treatment/Interventions Iontophoresis 4mg /ml Dexamethasone;Electrical Stimulation;Cryotherapy;Moist Heat;Ultrasound;Patient/family education;Neuromuscular re-education;Therapeutic exercise;Manual techniques;Scar mobilization;Taping;Dry needling   PT Next Visit Plan modalities for pain control, muslce re education, ther. ex for motor control, strength, manual STM   PT Home Exercise Plan re education for scapular control, scaption for movement, pain control with ice/heat/TENS      Patient will benefit from skilled therapeutic intervention in order to improve the following deficits and impairments:  Impaired UE functional use, Increased muscle spasms, Decreased activity tolerance, Decreased knowledge of precautions, Pain, Impaired perceived functional ability, Decreased strength  Visit Diagnosis: Stiffness of left shoulder, not elsewhere classified  Muscle weakness (generalized)  Left shoulder pain, unspecified chronicity     Problem List Patient Active Problem List   Diagnosis Date Noted  . Pulmonary hypertension 08/16/2012  . POTS (postural orthostatic tachycardia syndrome) 08/16/2012  . Chest pain 06/26/2012  . Shortness of breath 06/26/2012  . Rapid heart beat 06/26/2012    Beacher May PT 06/19/2016, 1:54 PM   [facility] [address] , [name], [address] Phone: [phone]   Fax:  [phone]  Name: Claudia [name] MRN: [id number] Date of Birth: 06-01-1966

## 2016-06-20 ENCOUNTER — Ambulatory Visit: Payer: 59 | Admitting: Physical Therapy

## 2016-06-20 ENCOUNTER — Encounter: Payer: Self-pay | Admitting: Physical Therapy

## 2016-06-20 DIAGNOSIS — M75112 Incomplete rotator cuff tear or rupture of left shoulder, not specified as traumatic: Secondary | ICD-10-CM | POA: Diagnosis not present

## 2016-06-20 DIAGNOSIS — M25512 Pain in left shoulder: Secondary | ICD-10-CM

## 2016-06-20 DIAGNOSIS — M25612 Stiffness of left shoulder, not elsewhere classified: Secondary | ICD-10-CM

## 2016-06-20 DIAGNOSIS — M6281 Muscle weakness (generalized): Secondary | ICD-10-CM | POA: Diagnosis not present

## 2016-06-20 NOTE — Therapy (Signed)
[place] [facility] [address] [place], [address] Phone: [phone]   Fax:  [phone]  Physical Therapy Treatment  Patient Details  Name: Claudia [name] MRN: [id number] Date of Birth: Feb 15, 1966 Referring Provider: Juanell Fairly MD  Encounter Date: 06/20/2016      PT End of Session - 06/20/16 0856    Visit Number 10   Number of Visits 16   Date for PT Re-Evaluation 07/12/16   PT Start Time 0843   PT Stop Time 0940   PT Time Calculation (min) 57 min   Activity Tolerance Patient tolerated treatment well   Behavior During Therapy [facility] for tasks assessed/performed      Past Medical History:  Diagnosis Date  . Arthritis    knees, back;   . Asthma    H/O YEARS AGO-NO INHALERS  . Difficult intubation   . Dysrhythmia    H/O [facility] PROBLEMS SINCE 2013  . Rotator cuff tear    left    Past Surgical History:  Procedure Laterality Date  . CARPAL TUNNEL RELEASE     x2  . CHOLECYSTECTOMY    . COLONOSCOPY    . SHOULDER ARTHROSCOPY WITH OPEN ROTATOR CUFF REPAIR Left 10/31/2015   Procedure: SHOULDER ARTHROSCOPY, SUBACROMIAL DECOMPRESSION, BICEPS TENOTOMY AND MINI OPEN ROTATOR CUFF REPAIR;  Surgeon: Juanell Fairly, MD;  Location: [facility] ORS;  Service: Orthopedics;  Laterality: Left;  . TONSILLECTOMY      There were no vitals filed for this visit.      Subjective Assessment - 06/20/16 0845    Subjective Patient reports she was a little sore following therapy session, feels tender in left shoulder. she is using heat and TENS with good results at home for pain control   Limitations House hold activities;Other (comment);Lifting   Patient Stated Goals pain relief to  be able to care for self, hair care, use left UE for housework, lifting pots, pans   Currently in Pain? Yes   Pain Score 3    Pain Location Shoulder   Pain Orientation Left   Pain Descriptors / Indicators Aching;Tender;Dull;Sharp   Pain  Type Chronic pain   Pain Onset More than a month ago   Pain Frequency Intermittent      Objective:  Palpation: decreased soft tissue elasticity left shoulder upper trapezius, lats, subscapularis Posture: mild forward head with left shoulder elevated and forward as compared to right  Treatment: Therapeutic exercise: patient performed exercises with verbal, tactile cues  demonstration of therapist: Sitting/standing: OMEGA; Resistive scapular rows 10# supinated forearms x 15 reps Straight arm pull downs standing: 10# x 15 reps Standing single arm row right UE 10#, left UE 5# x 15 reps each Supine: AAROM left shoulder flexion, abduction, rotations  X 10 reps each through pain free range Rhythmic stabilization at 90 degrees flexion 2 x 10, IR/ER in scapular plane neutral rotation x 10 Standing at door frame: Instructed in isometric flexion, ER/IR and extension against towel 5 x 5 second holds Upper trapezius stretch with holding left hand under chair 3 x 10 seconds  IManual therapy:  Supine lying pectoral and subscapularis STM superficial and deep techniques: goal improve ROM, decrease spasms  Modalities: Electrical stimulation: russian stimulation applied to medial border left scapula,10/10 cycle with patient performing scapular retraction with each cycle; high volt estim., for muscle spasms (2) electrodes applied to anterior and posterior aspect of left shoulder, intensity to comfort. patient seated in chair with UE's supported, moist heat  applied to left shoulder during treatment x 20 min. (no adverse reactions noted)  Patient response to treatment: Patient demonstrated improved technique with exercises with moderate VC and repeated demonstration for correct alignment. Patient with mild soreness in left shoulder with exercises, unable to perform UE exercises with as much resistance as right, limited ROM with ROM exercises due to stiffness and pain. Patient with improved soft tissue  elasticity by 50% following STM. Improved motor control with repetition and cuing. Decreased soreness to minimal following estim/moist heat.         PT Education - 06/20/16 [phone]    Education provided Yes   Education Details HEP: Upper trapezius stretch; isometric shoulder exercises   Person(s) Educated Patient   Methods Explanation;Demonstration;Verbal cues; handout   Comprehension Verbalized understanding;Returned demonstration;Verbal cues required             PT Long Term Goals - 05/17/16 1036      PT LONG TERM GOAL #1   Title Patient will be independent in home exercise program to improve strength/mobility for better functional independence with ADLs b 07/12/2016.    Baseline limited knowledge of appropriate pain control strategies, exercise progression for strength and ROM   Status New     PT LONG TERM GOAL #2   Title Patient will decrease Quick DASH score by > 8 points demonstrating reduced self-reported upper extremity disability every 4 weeks with <30% disabilty by 07/12/2016.    Baseline Quick dash 70%   Status New     PT LONG TERM GOAL #3   Title Patient will increase LUE shoulder AROM: sitting: Shoulder flexion >120 degrees, abduction: >120 degrees, supine: ER: >50 degrees, IR: >50 degrees for increased functional ROM with ADLs such as grooming, dressing etc by 07/12/2016   Baseline AROM left shoulder sitting: flexion 70, abduction 60, IR to side of hip, ER severe limitation with functional ROM; unable to raise to ear   Status New     PT LONG TERM GOAL #4   Title Patient will report pain level max. of 2/10 with right UE use by 07/12/2016 to allow improved function with overhead activities/personal care   Baseline pain leve max. 7/10 with using right UE for daily activities   Status New               Plan - 06/20/16 1191    Clinical Impression Statement Patient with improving control and strength in left UE. She is able to perform light resistive  exercise with mild soreness reported. Pain limited ROM > 130 overhead (supine) and weakness/pain limit overhead activities in sitting/standing. She will benefit from continued physical therapy intervention to achieve maximal function with minimal difficulty and pain.    Rehab Potential Fair   PT Frequency 2x / week   PT Duration 8 weeks   PT Treatment/Interventions Iontophoresis 4mg /ml Dexamethasone;Electrical Stimulation;Cryotherapy;Moist Heat;Ultrasound;Patient/family education;Neuromuscular re-education;Therapeutic exercise;Manual techniques;Scar mobilization;Taping;Dry needling   PT Next Visit Plan modalities for pain control, muslce re education, ther. ex for motor control, strength, manual STM   PT Home Exercise Plan re education for scapular control, scaption for movement, pain control with ice/heat/TENS      Patient will benefit from skilled therapeutic intervention in order to improve the following deficits and impairments:  Impaired UE functional use, Increased muscle spasms, Decreased activity tolerance, Decreased knowledge of precautions, Pain, Impaired perceived functional ability, Decreased strength  Visit Diagnosis: Stiffness of left shoulder, not elsewhere classified  Muscle weakness (generalized)  Left shoulder pain, unspecified chronicity  Problem List Patient Active Problem List   Diagnosis Date Noted  . Pulmonary hypertension 08/16/2012  . POTS (postural orthostatic tachycardia syndrome) 08/16/2012  . Chest pain 06/26/2012  . Shortness of breath 06/26/2012  . Rapid heart beat 06/26/2012    Beacher MayBrooks, Mercades Bajaj PT 06/21/2016, 7:17 AM  [place] [facility] [address] [place], [name], [address] Phone: [phone]   Fax:  [phone]  Name: Claudia [name] MRN: [id number] Date of Birth: 01-20-1966

## 2016-06-24 DIAGNOSIS — M25612 Stiffness of left shoulder, not elsewhere classified: Secondary | ICD-10-CM | POA: Diagnosis not present

## 2016-06-24 DIAGNOSIS — M75112 Incomplete rotator cuff tear or rupture of left shoulder, not specified as traumatic: Secondary | ICD-10-CM | POA: Diagnosis not present

## 2016-06-25 ENCOUNTER — Encounter: Payer: Self-pay | Admitting: Physical Therapy

## 2016-06-25 ENCOUNTER — Ambulatory Visit: Payer: 59 | Admitting: Physical Therapy

## 2016-06-25 DIAGNOSIS — M6281 Muscle weakness (generalized): Secondary | ICD-10-CM | POA: Diagnosis not present

## 2016-06-25 DIAGNOSIS — M75112 Incomplete rotator cuff tear or rupture of left shoulder, not specified as traumatic: Secondary | ICD-10-CM | POA: Diagnosis not present

## 2016-06-25 DIAGNOSIS — M25612 Stiffness of left shoulder, not elsewhere classified: Secondary | ICD-10-CM | POA: Diagnosis not present

## 2016-06-25 DIAGNOSIS — M25512 Pain in left shoulder: Secondary | ICD-10-CM | POA: Diagnosis not present

## 2016-06-25 NOTE — Therapy (Signed)
[place] [facility] [address] [place], [name], [address] Phone: [phone]   Fax:  [phone]  Physical Therapy Treatment  Patient Details  Name: Claudia [name] MRN: [id number] Date of Birth: 10-Feb-1966 Referring Provider: Juanell FairlyKrasinski, Kevin, MD  Encounter Date: 06/25/2016      PT End of Session - 06/25/16 0811    Visit Number 11   Number of Visits 16   Date for PT Re-Evaluation 07/12/16   PT Start Time 0806   PT Stop Time 0855   PT Time Calculation (min) 49 min   Activity Tolerance Patient tolerated treatment well   Behavior [facility] for tasks assessed/performed      Past Medical History:  Diagnosis Date  . Arthritis    knees, back;   . Asthma    H/O YEARS AGO-NO INHALERS  . Difficult intubation   . Dysrhythmia    [facility]  . Rotator cuff tear    left    Past Surgical History:  Procedure Laterality Date  . CARPAL TUNNEL RELEASE     x2  . CHOLECYSTECTOMY    . COLONOSCOPY    . SHOULDER ARTHROSCOPY WITH OPEN ROTATOR CUFF REPAIR Left 10/31/2015   Procedure: SHOULDER ARTHROSCOPY, SUBACROMIAL DECOMPRESSION, BICEPS TENOTOMY AND MINI OPEN ROTATOR CUFF REPAIR;  Surgeon: Juanell FairlyKevin Krasinski, MD;  Location: [facility] ORS;  Service: Orthopedics;  Laterality: Left;  . TONSILLECTOMY      There were no vitals filed for this visit.      Subjective Assessment - 06/25/16 0809    Subjective Patient reports she is less tender in left shoulder. She was seen by surgeon and is referred back to therapy for 8 weeks with additional diagnosis of partial tear supraspinatus left shoulder.    Limitations House hold activities;Other (comment);Lifting   Diagnostic tests had another MRI which showed partial thickness tear left shoulder/RTC: supraspinatus tendon   Patient Stated Goals pain relief to  be able to care for self, hair care, use left UE for housework, lifting pots, pans   Currently in  Pain? Yes   Pain Score 2    Pain Location Shoulder   Pain Orientation Left   Pain Descriptors / Indicators Sore   Pain Type Chronic pain   Pain Onset More than a month ago   Pain Frequency Intermittent            [facility] PT Assessment - 06/25/16 1041      Assessment   Medical Diagnosis Incomplete rotator cuff tear or rupture of left shoulder, not specified as traumatic M75.112   Referring Provider Juanell FairlyKrasinski, Kevin, MD   Onset Date/Surgical Date 10/31/15   Hand Dominance Right   Next MD Visit 06/24/16   Prior Therapy PT post surgery left RTC repair 10/31/15 through 02/2016      Objective:  Palpation: + spasms left upper trapezius and posterior aspect of shoulder, subscapularis AROM: sitting: left shoulder 0-110 with stretch feeling in shoulder end range  Treatment: Therapeutic exercise: patient performed exercises with verbal, tactile cues demonstration of therapist: Sitting/standing: OMEGA; Resistive scapular rows 10# supinated forearms x 15 reps Straight arm pull downs standing: 10# x 15 reps Standing single arm row right UE 5#, left UE 5# x 15 reps each At wall:  Hold ball on wall and walk ball up wall x 10 Stabilize ball with hand left UE and roll clockwise, counter clockwise x 5 and up/down x 5 Supine: AAROM left shoulder flexion, abduction,  rotations  X 10 reps each through pain free range Rhythmic stabilization at 90 degrees flexion 1 x 10, IR/ER in scapular plane neutral rotation x 5 Standing at door frame: Scapular retraction x 15  Manual therapy:  Supine lying pectoral and subscapularis STM superficial and deep techniques: goal improve ROM, decrease spasms  Modalities: Electrical stimulation: russian stimulation applied to medial border left scapula,10/10 cycle with patient performing scapular retraction with each cycle; high volt estim., for muscle spasms (2) electrodes applied to upper trapeziusand posterior aspect of left shoulder, intensity to comfort.  patient seated in chair with UE's supported, moist heat applied to left shoulder during treatment x 20 min. (no adverse reactions noted)  Patient response to treatment: Patient demonstrated improved technique with exercises with minimal/moderatel VC for correct alignment with new exercises on wall and standing resistive exercises.  Patient with decreased spasms by 50% following STM. Improved motor control with repetition and cuing especially noted with OMEGA exercises scapular retraction         PT Education - 06/25/16 1043    Education provided Yes   Education Details HEP; added wall exercises with ball for stability; clockwise, counter clockwisee roll and up/down roll   Person(s) Educated Patient   Methods Explanation;Demonstration;Verbal cues   Comprehension Verbalized understanding;Returned demonstration;Verbal cues required             PT Long Term Goals - 05/17/16 1036      PT LONG TERM GOAL #1   Title Patient will be independent in home exercise program to improve strength/mobility for better functional independence with ADLs b 07/12/2016.    Baseline limited knowledge of appropriate pain control strategies, exercise progression for strength and ROM   Status New     PT LONG TERM GOAL #2   Title Patient will decrease Quick DASH score by > 8 points demonstrating reduced self-reported upper extremity disability every 4 weeks with <30% disabilty by 07/12/2016.    Baseline Quick dash 70%   Status New     PT LONG TERM GOAL #3   Title Patient will increase LUE shoulder AROM: sitting: Shoulder flexion >120 degrees, abduction: >120 degrees, supine: ER: >50 degrees, IR: >50 degrees for increased functional ROM with ADLs such as grooming, dressing etc by 07/12/2016   Baseline AROM left shoulder sitting: flexion 70, abduction 60, IR to side of hip, ER severe limitation with functional ROM; unable to raise to ear   Status New     PT LONG TERM GOAL #4   Title Patient will report  pain level max. of 2/10 with right UE use by 07/12/2016 to allow improved function with overhead activities/personal care   Baseline pain leve max. 7/10 with using right UE for daily activities   Status New               Plan - 06/25/16 1044    Clinical Impression Statement Patient presents with additional order for continued physical therapy left shoulder with new diagnosis of incomplete rotator cuff tear left shoulder. She is progressing well with staibilization and left shoulder stabilization/strengthening with guidance and cuing and should continue to improve with additional physical therpay intervention to address limitaitons of decresaed strength and ROM.    Rehab Potential Fair   PT Frequency 2x / week   PT Duration 8 weeks   PT Treatment/Interventions Iontophoresis 4mg /ml Dexamethasone;Electrical Stimulation;Cryotherapy;Moist Heat;Ultrasound;Patient/family education;Neuromuscular re-education;Therapeutic exercise;Manual techniques;Scar mobilization;Taping;Dry needling   PT Next Visit Plan modalities for pain control, muslce re education, ther. ex for motor  control, strength, manual STM; re assess   PT Home Exercise Plan re education for scapular control, scaption for movement, pain control with ice/heat/TENS      Patient will benefit from skilled therapeutic intervention in order to improve the following deficits and impairments:  Impaired UE functional use, Increased muscle spasms, Decreased activity tolerance, Decreased knowledge of precautions, Pain, Impaired perceived functional ability, Decreased strength  Visit Diagnosis: Stiffness of left shoulder, not elsewhere classified  Muscle weakness (generalized)  Left shoulder pain, unspecified chronicity     Problem List Patient Active Problem List   Diagnosis Date Noted  . Pulmonary hypertension 08/16/2012  . POTS (postural orthostatic tachycardia syndrome) 08/16/2012  . Chest pain 06/26/2012  . Shortness of breath  06/26/2012  . Rapid heart beat 06/26/2012    Beacher MayBrooks, Marie PT 06/25/2016, 10:51 AM  [place] [facility] [address] , [name], [address] Phone: [phone]   Fax:  [phone]  Name: Claudia [name] MRN: [id number] Date of Birth: [birth year]

## 2016-06-27 ENCOUNTER — Ambulatory Visit: Payer: 59 | Attending: Orthopedic Surgery | Admitting: Physical Therapy

## 2016-06-27 ENCOUNTER — Encounter: Payer: Self-pay | Admitting: Physical Therapy

## 2016-06-27 DIAGNOSIS — M6281 Muscle weakness (generalized): Secondary | ICD-10-CM | POA: Insufficient documentation

## 2016-06-27 DIAGNOSIS — M25612 Stiffness of left shoulder, not elsewhere classified: Secondary | ICD-10-CM | POA: Diagnosis not present

## 2016-06-27 DIAGNOSIS — M25512 Pain in left shoulder: Secondary | ICD-10-CM | POA: Diagnosis not present

## 2016-06-28 NOTE — Therapy (Signed)
[place] [facility] PH[facility] [address] [place], [name], [address] Phone: [phone]   Fax:  [phone]  Physical Therapy Treatment  Patient Details  Name: Claudia [name] MRN: [id number] Date of Birth: 07-15-1966 Referring Provider: Juanell FairlyKrasinski, Kevin, MD  Encounter Date: 06/27/2016      PT End of Session - 06/27/16 0851    Visit Number 12   Number of Visits 16   Date for PT Re-Evaluation 07/12/16   PT Start Time 0811   PT Stop Time 0900   PT Time Calculation (min) 49 min   Activity Tolerance Patient tolerated treatment well   Behavior During Therapy [facility] for tasks assessed/performed      Past Medical History:  Diagnosis Date  . Arthritis    knees, back;   . Asthma    H/O YEARS AGO-NO INHALERS  . Difficult intubation   . Dysrhythmia    [facility]  . Rotator cuff tear    left    Past Surgical History:  Procedure Laterality Date  . CARPAL TUNNEL RELEASE     x2  . CHOLECYSTECTOMY    . COLONOSCOPY    . SHOULDER ARTHROSCOPY WITH OPEN ROTATOR CUFF REPAIR Left 10/31/2015   Procedure: SHOULDER ARTHROSCOPY, SUBACROMIAL DECOMPRESSION, BICEPS TENOTOMY AND MINI OPEN ROTATOR CUFF REPAIR;  Surgeon: Juanell FairlyKevin Krasinski, MD;  Location: [facility] ORS;  Service: Orthopedics;  Laterality: Left;  . TONSILLECTOMY      There were no vitals filed for this visit.      Subjective Assessment - 06/27/16 0847    Subjective Patient reports increased pain and having to take Advil and Aleve to control pain. She began hurting this morning and woke up with more pain.    Limitations House hold activities;Other (comment);Lifting   Patient Stated Goals pain relief to  be able to care for self, hair care, use left UE for housework, lifting pots, pans   Currently in Pain? Yes   Pain Score 3    Pain Location Shoulder   Pain Orientation Left   Pain Descriptors / Indicators Sore;Tightness   Pain Type Chronic pain   Pain Onset  More than a month ago   Pain Frequency Intermittent      Objective:  Palpation: + spasms left upper trapezius and posterior aspect of shoulder, subscapularis AROM: sitting: left shoulder 0-115 with stretch feeling in shoulder end range  Treatment: Therapeutic exercise: patient performed exercises with verbal, tactile cues demonstration of therapist: Sitting/standing: Sitting: shoulder retraction, extension left with guided motion x 10 reps At wall:  Hold ball on wall and walk ball up wall x 10 Stabilize ball with hand left UE and roll clockwise, counter clockwise x 5 and up/down x 5 Supine: AAROM left shoulder flexion, abduction, rotations X 10 reps each through pain free range, pectoral stretch Rhythmic stabilization at 90 degrees flexion 1 x 10, IR/ER in scapular plane neutral rotation x 5 Standing at door frame: Scapular retraction x 15 scaption x 10 with controlled motion  Manual therapy:  Supine lying pectoral and subscapularis STM superficial and deep techniques: goal improve ROM, decrease spasms  Modalities: Electrical stimulation: russian stimulation applied to medial border left scapula,10/10 cycle with patient performing scapular retraction with each cycle; high volt estim., for muscle spasms (2) electrodes applied to upper trapeziusand posterior aspect of left shoulder, intensity to comfort. patient seated in chair with UE's supported, moist heat applied to left shoulder during treatment x 20 min. (no adverse reactions  noted)  Patient response to treatment: Modified treatment due to increased pain reported on arrival. Patient demonstrated improved technique with exercises with minimal VC; Improved motor control with repetition and cuing. Patient with decreased pain from 3/10 to 1/10. Patient with decreased spasms by 50% following STM.           PT Education - 06/27/16 0850    Education provided Yes   Education Details HEP: re assessed ball on wall exercise  with demonstration and return demonstration with small ball   Person(s) Educated Patient   Methods Explanation;Demonstration;Verbal cues   Comprehension Verbalized understanding;Returned demonstration;Verbal cues required             PT Long Term Goals - 05/17/16 1036      PT LONG TERM GOAL #1   Title Patient will be independent in home exercise program to improve strength/mobility for better functional independence with ADLs b 07/12/2016.    Baseline limited knowledge of appropriate pain control strategies, exercise progression for strength and ROM   Status New     PT LONG TERM GOAL #2   Title Patient will decrease Quick DASH score by > 8 points demonstrating reduced self-reported upper extremity disability every 4 weeks with <30% disabilty by 07/12/2016.    Baseline Quick dash 70%   Status New     PT LONG TERM GOAL #3   Title Patient will increase LUE shoulder AROM: sitting: Shoulder flexion >120 degrees, abduction: >120 degrees, supine: ER: >50 degrees, IR: >50 degrees for increased functional ROM with ADLs such as grooming, dressing etc by 07/12/2016   Baseline AROM left shoulder sitting: flexion 70, abduction 60, IR to side of hip, ER severe limitation with functional ROM; unable to raise to ear   Status New     PT LONG TERM GOAL #4   Title Patient will report pain level max. of 2/10 with right UE use by 07/12/2016 to allow improved function with overhead activities/personal care   Baseline pain leve max. 7/10 with using right UE for daily activities   Status New               Plan - 06/27/16 0902    Clinical Impression Statement Patient is progressing well with good carry over between sessions with pain control and improving AROM and strength. She continues with weakness and pain that limit full AROM, functional use for daily activities sand will require additional physical therapy intervention to achieve maximal function.    PT Frequency 2x / week   PT Duration 8  weeks   PT Treatment/Interventions Iontophoresis 4mg /ml Dexamethasone;Electrical Stimulation;Cryotherapy;Moist Heat;Ultrasound;Patient/family education;Neuromuscular re-education;Therapeutic exercise;Manual techniques;Scar mobilization;Taping;Dry needling   PT Next Visit Plan modalities for pain control, muslce re education, ther. ex for motor control, strength, manual STM; re assess   PT Home Exercise Plan re education for scapular control, scaption for movement, pain control with ice/heat/TENS      Patient will benefit from skilled therapeutic intervention in order to improve the following deficits and impairments:  Impaired UE functional use, Increased muscle spasms, Decreased activity tolerance, Decreased knowledge of precautions, Pain, Impaired perceived functional ability, Decreased strength  Visit Diagnosis: Stiffness of left shoulder, not elsewhere classified  Muscle weakness (generalized)  Left shoulder pain, unspecified chronicity     Problem List Patient Active Problem List   Diagnosis Date Noted  . Pulmonary hypertension 08/16/2012  . POTS (postural orthostatic tachycardia syndrome) 08/16/2012  . Chest pain 06/26/2012  . Shortness of breath 06/26/2012  . Rapid heart beat  06/26/2012    Beacher May PT 06/28/2016, 3:31 PM  [place] [facility] PHYSICAL AND SPORTS MEDICINE [address] [place], [address] Phone: [phone]   Fax:  [phone]  Name: Claudia [name] MRN: [id number] Date of Birth: [birth year]

## 2016-07-03 ENCOUNTER — Encounter: Payer: Self-pay | Admitting: Physical Therapy

## 2016-07-03 ENCOUNTER — Ambulatory Visit: Payer: 59 | Admitting: Physical Therapy

## 2016-07-03 DIAGNOSIS — M6281 Muscle weakness (generalized): Secondary | ICD-10-CM | POA: Diagnosis not present

## 2016-07-03 DIAGNOSIS — M25512 Pain in left shoulder: Secondary | ICD-10-CM | POA: Diagnosis not present

## 2016-07-03 DIAGNOSIS — M25612 Stiffness of left shoulder, not elsewhere classified: Secondary | ICD-10-CM

## 2016-07-04 NOTE — Therapy (Signed)
[place] [facility] [address] [place], [name], [address] Phone: [phone]   Fax:  [phone]  Physical Therapy Treatment  Patient Details  Name: Claudia [name] MRN: [id number] Date of Birth: Nov 15, 1965 Referring Provider: Juanell FairlyKrasinski, Kevin, MD  Encounter Date: 07/03/2016      PT End of Session - 07/03/16 0949    Visit Number 13   Number of Visits 16   Date for PT Re-Evaluation 07/12/16   PT Start Time 0942   PT Stop Time 1035   PT Time Calculation (min) 53 min   Activity Tolerance Patient tolerated treatment well   Behavior During Therapy [facility] for tasks assessed/performed      Past Medical History:  Diagnosis Date  . Arthritis    knees, back;   . Asthma    H/O YEARS AGO-NO INHALERS  . Difficult intubation   . Dysrhythmia    [facility]  . Rotator cuff tear    left    Past Surgical History:  Procedure Laterality Date  . CARPAL TUNNEL RELEASE     x2  . CHOLECYSTECTOMY    . COLONOSCOPY    . SHOULDER ARTHROSCOPY WITH OPEN ROTATOR CUFF REPAIR Left 10/31/2015   Procedure: SHOULDER ARTHROSCOPY, SUBACROMIAL DECOMPRESSION, BICEPS TENOTOMY AND MINI OPEN ROTATOR CUFF REPAIR;  Surgeon: Juanell FairlyKevin Krasinski, MD;  Location: [facility] ORS;  Service: Orthopedics;  Laterality: Left;  . TONSILLECTOMY      There were no vitals filed for this visit.      Subjective Assessment - 07/03/16 0945    Subjective patient is alternating tylenol and anti inflammatory medication with good results. She is compliant with home program.    Limitations House hold activities;Other (comment);Lifting   Patient Stated Goals pain relief to  be able to care for self, hair care, use left UE for housework, lifting pots, pans   Currently in Pain? Yes   Pain Score 2    Pain Location Shoulder   Pain Orientation Left   Pain Descriptors / Indicators Discomfort;Sore   Pain Type Chronic pain   Pain Onset More than a month ago    Pain Frequency Intermittent      Objective:  Palpation: + spasms left upper trapezius and posterior aspect of shoulder, subscapularis AROM: sitting: left shoulder 0-105 degrees pre treatment  Treatment: Therapeutic exercise: patient performed exercises with verbal, tactile cues demonstration of therapist: Sitting/standing: Pendulums x 30 seconds  At door frame x 10 scapular retraction, scaption Shoulder rolls back x 10 Upper trapezius stretch 3 x 20 seconds Re check AROM: flexion left shoulder 0-115 degrees  At wall:  Hold ball on wall and walk ball up wall x 10 Stabilize ball with hand left UE and roll clockwise, counter clockwise x 5 and up/down x 5 Supine: AAROM left shoulder flexion, abduction, rotations X 10 reps each through pain free range, pectoral stretch Rhythmic stabilization at 90 degrees flexion 1x 10, IR/ER in scapular plane neutral rotation x 5  Manual therapy:  Supine lying pectoral and subscapularis STM superficial and deep techniques: goal improve ROM, decrease spasms  Modalities: Electrical stimulation: russian stimulation applied to medial border left scapula,10/10 cycle with patient performing scapular retraction with each cycle; high volt estim., for muscle spasms (2) electrodes applied to upper trapeziusand posterior aspect of left shoulder, intensity to comfort. patient seated in chair with UE's supported, moist heat applied to left shoulder during treatment x 20 min. (no adverse reactions noted)  Patient response  to treatment: Patient demonstrated improved AROM by 10 degrees with stretches and guided exercises, performed exercises with good technique with minimal VC for correct alignment. Patient with pain average 2/10  Patient with decreased spasms by 50% following STM. Improved motor control with repetition and cuing; decreased stiffness/pain to mild/none following moist heat/estim.         PT Education - 07/03/16 1030    Education provided  Yes   Education Details HEP: re assessed exercises for left shoulder scapular control, stabilization   Person(s) Educated Patient   Methods Explanation;Demonstration;Verbal cues   Comprehension Verbalized understanding;Returned demonstration;Verbal cues required             PT Long Term Goals - 05/17/16 1036      PT LONG TERM GOAL #1   Title Patient will be independent in home exercise program to improve strength/mobility for better functional independence with ADLs b 07/12/2016.    Baseline limited knowledge of appropriate pain control strategies, exercise progression for strength and ROM   Status New     PT LONG TERM GOAL #2   Title Patient will decrease Quick DASH score by > 8 points demonstrating reduced self-reported upper extremity disability every 4 weeks with <30% disabilty by 07/12/2016.    Baseline Quick dash 70%   Status New     PT LONG TERM GOAL #3   Title Patient will increase LUE shoulder AROM: sitting: Shoulder flexion >120 degrees, abduction: >120 degrees, supine: ER: >50 degrees, IR: >50 degrees for increased functional ROM with ADLs such as grooming, dressing etc by 07/12/2016   Baseline AROM left shoulder sitting: flexion 70, abduction 60, IR to side of hip, ER severe limitation with functional ROM; unable to raise to ear   Status New     PT LONG TERM GOAL #4   Title Patient will report pain level max. of 2/10 with right UE use by 07/12/2016 to allow improved function with overhead activities/personal care   Baseline pain leve max. 7/10 with using right UE for daily activities   Status New               Plan - 07/03/16 0950    Clinical Impression Statement Patient is progressing well with good carry over between visits with decreasing pain and improvement with functional use of left UE. She continues with limitations of ROM, strength and endurance and decreased knowledge of appropriate exercise progression.    Rehab Potential Fair   PT Frequency 2x /  week   PT Duration 8 weeks   PT Treatment/Interventions Iontophoresis 4mg /ml Dexamethasone;Electrical Stimulation;Cryotherapy;Moist Heat;Ultrasound;Patient/family education;Neuromuscular re-education;Therapeutic exercise;Manual techniques;Scar mobilization;Taping;Dry needling   PT Next Visit Plan modalities for pain control, muslce re education, ther. ex for motor control, strength, manual STM; re assess   PT Home Exercise Plan re education for scapular control, scaption for movement, pain control with ice/heat/TENS      Patient will benefit from skilled therapeutic intervention in order to improve the following deficits and impairments:  Impaired UE functional use, Increased muscle spasms, Decreased activity tolerance, Decreased knowledge of precautions, Pain, Impaired perceived functional ability, Decreased strength  Visit Diagnosis: Stiffness of left shoulder, not elsewhere classified  Muscle weakness (generalized)  Left shoulder pain, unspecified chronicity     Problem List Patient Active Problem List   Diagnosis Date Noted  . Pulmonary hypertension 08/16/2012  . POTS (postural orthostatic tachycardia syndrome) 08/16/2012  . Chest pain 06/26/2012  . Shortness of breath 06/26/2012  . Rapid heart beat 06/26/2012  Beacher MayBrooks, Marie PT 07/04/2016, 1:46 PM  [place] [facility] [address] [place], [name], [address] Phone: [phone]   Fax:  [phone]  Name: Claudia [name] MRN: [id number] Date of Birth: [birth year]

## 2016-07-08 ENCOUNTER — Encounter: Payer: Self-pay | Admitting: Physical Therapy

## 2016-07-08 ENCOUNTER — Ambulatory Visit: Payer: 59 | Admitting: Physical Therapy

## 2016-07-08 DIAGNOSIS — M6281 Muscle weakness (generalized): Secondary | ICD-10-CM | POA: Diagnosis not present

## 2016-07-08 DIAGNOSIS — M25612 Stiffness of left shoulder, not elsewhere classified: Secondary | ICD-10-CM

## 2016-07-08 DIAGNOSIS — M25512 Pain in left shoulder: Secondary | ICD-10-CM

## 2016-07-08 NOTE — Therapy (Signed)
[place] [facility] [address] [place], [name], [address] Phone: [phone]   Fax:  [phone]  Physical Therapy Treatment  Patient Details  Name: Claudia [name] MRN: [id number] Date of Birth: 1965-08-30 Referring Provider: Juanell FairlyKrasinski, Kevin, MD  Encounter Date: 07/08/2016      PT End of Session - 07/08/16 1010    Visit Number 14   Number of Visits 16   Date for PT Re-Evaluation 07/12/16   PT Start Time 0933   PT Stop Time 1019   PT Time Calculation (min) 46 min   Activity Tolerance Patient tolerated treatment well   Behavior During Therapy [facility] for tasks assessed/performed      Past Medical History:  Diagnosis Date  . Arthritis    knees, back;   . Asthma    H/O YEARS AGO-NO INHALERS  . Difficult intubation   . [facility]rhythmia    [facility]  . Rotator cuff tear    left    Past Surgical History:  Procedure Laterality Date  . CARPAL TUNNEL RELEASE     x2  . CHOLECYSTECTOMY    . COLONOSCOPY    . SHOULDER ARTHROSCOPY WITH OPEN ROTATOR CUFF REPAIR Left 10/31/2015   Procedure: SHOULDER ARTHROSCOPY, SUBACROMIAL DECOMPRESSION, BICEPS TENOTOMY AND MINI OPEN ROTATOR CUFF REPAIR;  Surgeon: Juanell FairlyKevin Krasinski, MD;  Location: [facility];  Service: Orthopedics;  Laterality: Left;  . TONSILLECTOMY      There were no vitals filed for this visit.      Subjective Assessment - 07/08/16 0936    Subjective patient still alteranting tylenol and anti inflammatory medication with good results. She reports she is improving with therapy treatments and is able to move left arm with less pain now. She is still weak.    Limitations House hold activities;Other (comment);Lifting   Patient Stated Goals pain relief to  be able to care for self, hair care, use left UE for housework, lifting pots, pans   Currently in Pain? Yes   Pain Score 1    Pain Location Shoulder   Pain Orientation Left   Pain Descriptors /  Indicators Discomfort;Sore   Pain Type Chronic pain   Pain Onset More than a month ago   Pain Frequency Intermittent      Objective:  Palpation: + spasms left upper trapezius and posterior aspect of shoulder, subscapularis, lats AROM: sitting: left shoulder 0-120 degrees pre treatment  Treatment: Therapeutic exercise: patient performed exercises with verbal, tactile cues demonstration of therapist: Sitting/standing: At door frame x 10 scapular retraction, scaption Shoulder rolls back x 10 Upper trapezius stretch 3 x 20 seconds, holding 3# weight in hand AAROM with 3# weight in left hand, raise hands to forehead x 5 reps At wall:  Hold ball on wall and walk ball up wall x 10 Stabilize ball with hand left UE and roll clockwise, counter clockwise 2 x 5 and up/down 2 x 5 Supine: AAROM left shoulder flexion, abduction, rotations X 10 reps each through pain free range, pectoral stretch ( increased AAROM to 130 degrees flexion) Chest press holding 3# weight in both hands x 10 ER off trunk with 3# weight and in scapular plane x 10 with guided motion of therapist Rhythmic stabilization at 90 degrees flexion 1x 10, IR/ER in scapular plane neutral rotation  2 x 5 reps  Manual therapy: Supine lying pectoral and subscapularis STM superficial and deep techniques: goal improve ROM, decrease spasms  Modalities: Electrical stimulation: russian  stimulation applied to medial border left scapula,10/10 cycle with patient performing scapular retraction with each cycle; high volt estim., for muscle spasms (2) electrodes applied to upper trapeziusand posterior aspect of left shoulder, intensity to comfort. patient seated in chair with UE's supported, moist heat applied to left shoulder during treatment x 15 min. (no adverse reactions noted)  Patient response to treatment: patient demonstrated improved technique with exercises with minimal VC for correct alignment. No increased pain, only stretch  sensations with AAROM supine lying. Patient with decreased spasms by 50% following STM. Improved motor control with repetition and cuing        PT Education - 07/08/16 1030    Education provided Yes   Education Details HEP: shoulder exercises performed with VC and tactile cuing, correct alignment of shoulder   Person(s) Educated Patient   Methods Explanation;Demonstration;Tactile cues;Verbal cues   Comprehension Verbalized understanding;Returned demonstration;Verbal cues required;Tactile cues required             PT Long Term Goals - 05/17/16 1036      PT LONG TERM GOAL #1   Title Patient will be independent in home exercise program to improve strength/mobility for better functional independence with ADLs b 07/12/2016.    Baseline limited knowledge of appropriate pain control strategies, exercise progression for strength and ROM   Status New     PT LONG TERM GOAL #2   Title Patient will decrease Quick DASH score by > 8 points demonstrating reduced self-reported upper extremity disability every 4 weeks with <30% disabilty by 07/12/2016.    Baseline Quick dash 70%   Status New     PT LONG TERM GOAL #3   Title Patient will increase LUE shoulder AROM: sitting: Shoulder flexion >120 degrees, abduction: >120 degrees, supine: ER: >50 degrees, IR: >50 degrees for increased functional ROM with ADLs such as grooming, dressing etc by 07/12/2016   Baseline AROM left shoulder sitting: flexion 70, abduction 60, IR to side of hip, ER severe limitation with functional ROM; unable to raise to ear   Status New     PT LONG TERM GOAL #4   Title Patient will report pain level max. of 2/10 with right UE use by 07/12/2016 to allow improved function with overhead activities/personal care   Baseline pain leve max. 7/10 with using right UE for daily activities   Status New               Plan - 07/08/16 1011    Clinical Impression Statement Patient is progressing well with good carry over  between visits. She has improved mobility and has increased intensity of exercises demonstrating improvement with strength and endurance. She continues with weakness and limitations of ROM, endurance and pain and will benefit from additional physical therapy intervention to achieve maximal functional return.    Rehab Potential Fair   PT Frequency 2x / week   PT Duration 8 weeks   PT Treatment/Interventions Iontophoresis 4mg /ml Dexamethasone;Electrical Stimulation;Cryotherapy;Moist Heat;Ultrasound;Patient/family education;Neuromuscular re-education;Therapeutic exercise;Manual techniques;Scar mobilization;Taping;Dry needling   PT Next Visit Plan modalities for pain control, muslce re education, ther. ex for motor control, strength, manual STM; re assess   PT Home Exercise Plan re education for scapular control, scaption for movement, pain control with ice/heat/TENS      Patient will benefit from skilled therapeutic intervention in order to improve the following deficits and impairments:  Impaired UE functional use, Increased muscle spasms, Decreased activity tolerance, Decreased knowledge of precautions, Pain, Impaired perceived functional ability, Decreased strength  Visit  Diagnosis: Stiffness of left shoulder, not elsewhere classified  Muscle weakness (generalized)  Left shoulder pain, unspecified chronicity     Problem List Patient Active Problem List   Diagnosis Date Noted  . Pulmonary hypertension 08/16/2012  . POTS (postural orthostatic tachycardia syndrome) 08/16/2012  . Chest pain 06/26/2012  . Shortness of breath 06/26/2012  . Rapid heart beat 06/26/2012    Beacher May PT 07/09/2016, 1:54 PM  [place] [facility] PHYSICAL AND SPORTS MEDICINE [address], [place], [address] Phone: [phone]   Fax:  [phone]  Name: Claudia [name] MRN: [id number] Date of Birth: December 21, 1965

## 2016-07-12 ENCOUNTER — Encounter: Payer: Self-pay | Admitting: Physical Therapy

## 2016-07-12 ENCOUNTER — Ambulatory Visit: Payer: 59 | Admitting: Physical Therapy

## 2016-07-12 DIAGNOSIS — M6281 Muscle weakness (generalized): Secondary | ICD-10-CM

## 2016-07-12 DIAGNOSIS — M25612 Stiffness of left shoulder, not elsewhere classified: Secondary | ICD-10-CM

## 2016-07-12 DIAGNOSIS — M25512 Pain in left shoulder: Secondary | ICD-10-CM

## 2016-07-12 NOTE — Therapy (Signed)
[place] [facility] [address] [place], [address] Phone: [phone]   Fax:  [phone]  Physical Therapy Treatment  Patient Details  Name: Claudia [name] MRN: [id number] Date of Birth: [birth year] Referring Provider: Juanell Fairly, MD  Encounter Date: 07/12/2016      PT End of Session - 07/12/16 0938    Visit Number 15   Number of Visits 16   Date for PT Re-Evaluation 07/12/16   PT Start Time 0926   PT Stop Time 1015   PT Time Calculation (min) 49 min   Activity Tolerance Patient tolerated treatment well   Behavior During Therapy [facility] for tasks assessed/performed      Past Medical History:  Diagnosis Date  . Arthritis    knees, back;   . Asthma    H/O YEARS AGO-NO INHALERS  . Difficult intubation   . Dysrhythmia    H/O [facility] PROBLEMS SINCE 2013  . Rotator cuff tear    left    Past Surgical History:  Procedure Laterality Date  . CARPAL TUNNEL RELEASE     x2  . CHOLECYSTECTOMY    . COLONOSCOPY    . SHOULDER ARTHROSCOPY WITH OPEN ROTATOR CUFF REPAIR Left 10/31/2015   Procedure: SHOULDER ARTHROSCOPY, SUBACROMIAL DECOMPRESSION, BICEPS TENOTOMY AND MINI OPEN ROTATOR CUFF REPAIR;  Surgeon: Juanell Fairly, MD;  Location: [facility] ORS;  Service: Orthopedics;  Laterality: Left;  . TONSILLECTOMY      There were no vitals filed for this visit.      Subjective Assessment - 07/12/16 0933    Subjective Patient reports she is controlling pain with medication as before. She did have to do more at home with moving furniture, light objects such as clothes and nothing heavy, around a little and she    Limitations House hold activities;Other (comment);Lifting   Patient Stated Goals pain relief to  be able to care for self, hair care, use left UE for housework, lifting pots, pans   Currently in Pain? Yes   Pain Score 1    Pain Location Shoulder   Pain Orientation Left   Pain Descriptors / Indicators  Discomfort;Sore   Pain Type Chronic pain   Pain Onset More than a month ago   Pain Frequency Intermittent      Treatment: Therapeutic exercise: patient performed exercises with verbal, tactile cues demonstration of therapist: Sitting/standing: At door frame x 10 scapular retraction, scaption Upper trapezius stretch 3 x 20 seconds Hold ball on wall and walk ball up wall x 10 Stabilize ball with hand left UE and roll clockwise, counter clockwise 2 x 5 and up/down 2 x 5 Isometric exercises with resistive band scapular retraction, ER/IR x 5 reps with 5 second holds Supine: AAROM left shoulder flexion, abduction, rotations X 10 reps each through pain free range, pectoral stretch ( increased AAROM to 130 degrees flexion) Rhythmic stabilization at 90 degrees flexion 1x 10, IR/ER in scapular plane neutral rotation  2 x 5 reps  Manual therapy: Supine lying pectoral and subscapularis STM superficial and deep techniques: goal improve ROM, decrease spasms  Modalities: Electrical stimulation: russian stimulation applied to medial border left scapula,10/10 cycle with patient performing scapular retraction with each cycle; high volt estim., for muscle spasms (2) electrodes applied to upper trapeziusand posterior aspect of left shoulder, intensity to comfort. patient seated in chair with UE's supported, moist heat applied to left shoulder during treatment x 20 min. (no adverse reactions noted)  Patient response to treatment:  patient demonstrated improved technique with exercises with minimal VC for correct alignment.  Patient with decreased spasms by 50% following STM. Improved motor control with repetition and cuing          PT Education - 07/12/16 0936    Education provided Yes   Education Details HEP: isometric exercises with resistive band, stabilization with ball on wall   Person(s) Educated Patient   Methods Explanation;Demonstration;Verbal cues   Comprehension Verbalized  understanding;Returned demonstration;Verbal cues required             PT Long Term Goals - 05/17/16 1036      PT LONG TERM GOAL #1   Title Patient will be independent in home exercise program to improve strength/mobility for better functional independence with ADLs b 07/12/2016.    Baseline limited knowledge of appropriate pain control strategies, exercise progression for strength and ROM   Status New     PT LONG TERM GOAL #2   Title Patient will decrease Quick DASH score by > 8 points demonstrating reduced self-reported upper extremity disability every 4 weeks with <30% disabilty by 07/12/2016.    Baseline Quick dash 70%   Status New     PT LONG TERM GOAL #3   Title Patient will increase LUE shoulder AROM: sitting: Shoulder flexion >120 degrees, abduction: >120 degrees, supine: ER: >50 degrees, IR: >50 degrees for increased functional ROM with ADLs such as grooming, dressing etc by 07/12/2016   Baseline AROM left shoulder sitting: flexion 70, abduction 60, IR to side of hip, ER severe limitation with functional ROM; unable to raise to ear   Status New     PT LONG TERM GOAL #4   Title Patient will report pain level max. of 2/10 with right UE use by 07/12/2016 to allow improved function with overhead activities/personal care   Baseline pain leve max. 7/10 with using right UE for daily activities   Status New               Plan - 07/12/16 0940    Clinical Impression Statement Patient is progressing well demonstrating improvement with less pain, improving strength in left shoulder with guided exercises. She will continue to benefit from physical therapy intervention to achieve goals.    Rehab Potential Fair   PT Frequency 2x / week   PT Duration 8 weeks   PT Treatment/Interventions Iontophoresis 4mg /ml Dexamethasone;Electrical Stimulation;Cryotherapy;Moist Heat;Ultrasound;Patient/family education;Neuromuscular re-education;Therapeutic exercise;Manual techniques;Scar  mobilization;Taping;Dry needling   PT Next Visit Plan modalities for pain control, muslce re education, ther. ex for motor control, strength, manual STM; re assess   PT Home Exercise Plan re education for scapular control, scaption for movement, pain control with ice/heat/TENS      Patient will benefit from skilled therapeutic intervention in order to improve the following deficits and impairments:  Impaired UE functional use, Increased muscle spasms, Decreased activity tolerance, Decreased knowledge of precautions, Pain, Impaired perceived functional ability, Decreased strength  Visit Diagnosis: Stiffness of left shoulder, not elsewhere classified  Muscle weakness (generalized)  Left shoulder pain, unspecified chronicity     Problem List Patient Active Problem List   Diagnosis Date Noted  . Pulmonary hypertension 08/16/2012  . POTS (postural orthostatic tachycardia syndrome) 08/16/2012  . Chest pain 06/26/2012  . Shortness of breath 06/26/2012  . Rapid heart beat 06/26/2012    Beacher MayBrooks, Marie PT 07/13/2016, 10:32 AM  [place] [facility] [address] [place], [name], [address] Phone: [phone]   Fax:  [phone]  Name: Hortencia PilarLawana A Detert MRN: [id number] Date of Birth: 09-04-1965

## 2016-07-16 ENCOUNTER — Ambulatory Visit: Payer: 59 | Admitting: Physical Therapy

## 2016-07-16 ENCOUNTER — Encounter: Payer: Self-pay | Admitting: Physical Therapy

## 2016-07-16 DIAGNOSIS — M25512 Pain in left shoulder: Secondary | ICD-10-CM

## 2016-07-16 DIAGNOSIS — M25612 Stiffness of left shoulder, not elsewhere classified: Secondary | ICD-10-CM | POA: Diagnosis not present

## 2016-07-16 DIAGNOSIS — M6281 Muscle weakness (generalized): Secondary | ICD-10-CM

## 2016-07-16 NOTE — Therapy (Signed)
[place] [facility] [address] [place], [name], [address] Phone: [phone]   Fax:  [phone]  Physical Therapy Treatment  Patient Details  Name: Claudia PilarLawana A Bacallao MRN: [id number] Date of Birth: 19-Apr-1966 Referring Provider: Juanell FairlyKrasinski, Kevin, MD  Encounter Date: 07/16/2016      PT End of Session - 07/16/16 0820    Visit Number 16   Number of Visits 32   Date for PT Re-Evaluation 09/10/16   PT Start Time 0810   PT Stop Time 0910   PT Time Calculation (min) 60 min   Activity Tolerance Patient tolerated treatment well   Behavior During Therapy [facility] for tasks assessed/performed      Past Medical History:  Diagnosis Date  . Arthritis    knees, back;   . Asthma    H/O YEARS AGO-NO INHALERS  . Difficult intubation   . [facility]    [facility]  . Rotator cuff tear    left    Past Surgical History:  Procedure Laterality Date  . CARPAL TUNNEL RELEASE     x2  . CHOLECYSTECTOMY    . COLONOSCOPY    . SHOULDER ARTHROSCOPY WITH OPEN ROTATOR CUFF REPAIR Left 10/31/2015   Procedure: SHOULDER ARTHROSCOPY, SUBACROMIAL DECOMPRESSION, BICEPS TENOTOMY AND MINI OPEN ROTATOR CUFF REPAIR;  Surgeon: Juanell FairlyKevin Krasinski, MD;  Location: [facility] OR[facility];  Service: Orthopedics;  Laterality: Left;  . TONSILLECTOMY      There were no vitals filed for this visit.      Subjective Assessment - 07/16/16 0815    Subjective Patient reports she is doing better with improving flexiblity in left shoulder with less pain. She feels the therapy is still helping with stiffness and pain in shoulder and the electrical stimulation is helping quite a bit.    Limitations House hold activities;Other (comment);Lifting   Patient Stated Goals pain relief to  be able to care for self, hair care, use left UE for housework, lifting pots, pans   Currently in Pain? Yes   Pain Score 1    Pain Location Shoulder   Pain Orientation Left    Pain Descriptors / Indicators Tender   Pain Type Chronic pain   Pain Onset More than a month ago   Pain Frequency Intermittent     Objective: Outcome Measure: QuickDash 40% impairment AROM: left shoulder flexion in sitting: 115 with stiffness, weakness  Strengt left shoulder,  3+/5 flexion, abduction, ER left shoulder   Treatment: Therapeutic exercise: patient performed exercises with verbal, tactile cues demonstration of therapist: Sitting/standing: At door frame x 10 scapular retraction, scaption Upper trapezius stretch 3 x 20 seconds Isometric exercises with resistive band scapular retraction, ER/IR x 5 reps with 5 second holds Supine: AAROM left shoulder flexion, abduction, rotations X 10 reps each through pain free range, pectoral stretch ( increased AAROM to 130 degrees flexion) Rhythmic stabilization at 90 degrees flexion 1x 10, IR/ER in scapular plane neutral rotation 2 x 5 reps  Manual therapy: Supine lying pectoral and subscapularis STM superficial and deep techniques: goal improve ROM, decrease spasms  Modalities: Electrical stimulation: russian stimulation applied to medial border left scapula,10/10 cycle with patient performing scapular retraction with each cycle; high volt estim., for muscle spasms (2) electrodes applied to upper trapeziusand posterior aspect of left shoulder, intensity to comfort. patient seated in chair with UE's supported, moist heat applied to left shoulder during treatment x 20min. (no adverse reactions noted)  Patient response to treatment: Patient  response to treatment: patient demonstrated improved technique with exercises with minimal VC for correct alignment. No increased pain reported with exercises, remained sore, stretching sensation with ROM overhead.  Patient with decreased spasms by 50% following STM. Improved motor control with repetition and cuing.      PT Education - 07/16/16 0900    Education provided Yes   Education  Details HEP; re assessed isometric exercises with resistive band, ROM exercises   Person(s) Educated Patient   Methods Explanation;Demonstration;Verbal cues   Comprehension Verbalized understanding;Returned demonstration;Verbal cues required             PT Long Term Goals - 07/16/16 0912      PT LONG TERM GOAL #1   Title Patient will be independent in home exercise program to improve strength/mobility for better functional independence with ADLs b 09/10/2016.    Baseline limited knowledge of appropriate pain control strategies, exercise progression for strength and ROM   Status Revised     PT LONG TERM GOAL #2   Title Patient will improved Quick DASH score to 20% or less impairment demonstrating reduced self-reported upper extremity disability by 09/10/2016.    Baseline Quick dash 70%; current 07/16/16 40% impairment    Status Revised     PT LONG TERM GOAL #3   Title Patient will increase LUE shoulder AROM: sitting: Shoulder flexion >140 degrees, abduction: >120 degrees,  for increased functional ROM with ADLs such as grooming, dressing etc by 09/10/2016   Baseline AROM left shoulder sitting: flexion 70, abduction 60, IR to side of hip, ER severe limitation with functional ROM; unable to raise to ear; current 07/16/16 flexion 0-115, abduction 100, ER/IR WFL   Status Revised     PT LONG TERM GOAL #4   Title Patient will report pain level max. of 2/10 with right UE use by 09/10/2016 to allow improved function with overhead activities/personal care   Baseline pain leve max. 7/10 with using right UE for daily activities; current max level 5/10   Status Revised               Plan - 07/16/16 0904    Clinical Impression Statement Progressing well with goals and continues with 40% impairment based on quickDash score, strength and ROM deficits. She will continue to benefit from physical therapy intervention to address limitations of strength, ROM in order to maximize functional use  with left UE in order to return to prior level of function.    Rehab Potential Fair   PT Frequency 2x / week   PT Duration 8 weeks   PT Treatment/Interventions Iontophoresis 4mg /ml Dexamethasone;Electrical Stimulation;Cryotherapy;Moist Heat;Ultrasound;Patient/family education;Neuromuscular re-education;Therapeutic exercise;Manual techniques;Scar mobilization;Taping;Dry needling   PT Next Visit Plan modalities for pain control, muslce re education, ther. ex for motor control, strength, manual STM   PT Home Exercise Plan scapular control, scaption for movement, pain control with heat/TENS, isometric band exercises and scapular retraction band exercise      Patient will benefit from skilled therapeutic intervention in order to improve the following deficits and impairments:  Impaired UE functional use, Increased muscle spasms, Decreased activity tolerance, Decreased knowledge of precautions, Pain, Impaired perceived functional ability, Decreased strength  Visit Diagnosis: Stiffness of left shoulder, not elsewhere classified - Plan: PT plan of care cert/re-cert  Muscle weakness (generalized) - Plan: PT plan of care cert/re-cert  Left shoulder pain, unspecified chronicity - Plan: PT plan of care cert/re-cert     Problem List Patient Active Problem List   Diagnosis Date Noted  .  Pulmonary hypertension 08/16/2012  . POTS (postural orthostatic tachycardia syndrome) 08/16/2012  . Chest pain 06/26/2012  . Shortness of breath 06/26/2012  . Rapid heart beat 06/26/2012    Beacher May PT 07/17/2016, 12:02 PM  [place] [facility] PHYS[facility] AND SPORTS MEDICINE [address], [place], [address] Phone: [phone]   Fax:  [phone]  Name: KEMBERLY TAVES MRN: [id number] Date of Birth: 01-Mar-1966

## 2016-07-23 ENCOUNTER — Encounter: Payer: Self-pay | Admitting: Physical Therapy

## 2016-07-23 ENCOUNTER — Ambulatory Visit: Payer: 59 | Admitting: Physical Therapy

## 2016-07-23 DIAGNOSIS — M25512 Pain in left shoulder: Secondary | ICD-10-CM

## 2016-07-23 DIAGNOSIS — M6281 Muscle weakness (generalized): Secondary | ICD-10-CM

## 2016-07-23 DIAGNOSIS — M25612 Stiffness of left shoulder, not elsewhere classified: Secondary | ICD-10-CM

## 2016-07-23 NOTE — Therapy (Signed)
[place] [facility] [address] [place], [name], [address] Phone: [phone]   Fax:  [phone]  Physical Therapy Treatment  Patient Details  Name: Claudia PilarLawana A Lim MRN: [id number] Date of Birth: 11-09-1965 Referring Provider: Juanell FairlyKrasinski, Kevin, MD  Encounter Date: 07/23/2016      PT End of Session - 07/23/16 0916    Visit Number 17   Number of Visits 32   Date for PT Re-Evaluation 09/10/16   PT Start Time 0840   PT Stop Time 0930   PT Time Calculation (min) 50 min   Activity Tolerance Patient tolerated treatment well   Behavior During Therapy [facility] for tasks assessed/performed      Past Medical History:  Diagnosis Date  . Arthritis    knees, back;   . Asthma    H/O YEARS AGO-NO INHALERS  . Difficult intubation   . D[facility]rhythmia    [facility]  . Rotator cuff tear    left    Past Surgical History:  Procedure Laterality Date  . CARPAL TUNNEL RELEASE     x2  . CHOLECYSTECTOMY    . COLONOSCOPY    . SHOULDER ARTHROSCOPY WITH OPEN ROTATOR CUFF REPAIR Left 10/31/2015   Procedure: SHOULDER ARTHROSCOPY, SUBACROMIAL DECOMPRESSION, BICEPS TENOTOMY AND MINI OPEN ROTATOR CUFF REPAIR;  Surgeon: Juanell FairlyKevin Krasinski, MD;  Location: [facility];  Service: Orthopedics;  Laterality: Left;  . TONSILLECTOMY      There were no vitals filed for this visit.      Subjective Assessment - 07/23/16 0842    Subjective Patient reports she is doing well with left shoulder and is still about the same as last week. The cold weather is affecting her with increased stiffness in the morning. She is still taking tylenol for pain control.   Limitations House hold activities;Other (comment);Lifting   Patient Stated Goals pain relief to  be able to care for self, hair care, use left UE for housework, lifting pots, pans   Currently in Pain? Yes   Pain Score 1    Pain Location Shoulder   Pain Orientation Left   Pain  Descriptors / Indicators Discomfort   Pain Type Chronic pain   Pain Onset More than a month ago   Pain Frequency Intermittent       Objective: AROM: left shoulder sitting pre treatment 0-110 AAROM  Left shoulder supine lying 0-140  Treatment: Therapeutic exercise: patient performed exercises with verbal, tactile cues demonstration of therapist: Sitting/standing: At door frame x 10 scapular retraction, scaption UE Ranger forward flexion on floor x 25, rotations x 25 Standing forward flexion with UE Ranger x 25, rotations x 25 Cable seated row 5# x 15 Standing straight arm pull downs 10# x 15 reps Supine: AAROM left shoulder flexion, abduction, rotations X 10 reps each through pain free range, pectoral stretch ( increased AAROM to 130 degrees flexion) Rhythmic stabilization at 90 degrees flexion 1x 10, IR/ER in scapular plane neutral rotation 2 x 5 reps  Manual therapy:  Supine  lying pectoral and subscapularis and upper trapezius STM superficial and deep techniques: goal improve ROM, decrease spasms  Modalities: Electrical stimulation: russian stimulation applied to medial border left scapula,10/10 cycle with patient performing scapular retraction with each cycle; high volt estim., for muscle spasms (2) electrodes applied to upper trapeziusand posterior aspect of left shoulder, intensity to comfort. patient seated in chair with UE's supported, moist heat applied to left shoulder during treatment x 20min. (no  adverse reactions noted)  Patient response to treatment: Patient able to perform exercises through increased ROM with assistance of UE Ranger. Improved AROM to 120 following exercises with ranger and cable. Patient demonstrated improved technique with exercises with minimal VC for correct alignment. No increased pain reported with exercises.  Patient with decreased spasms by 50% following STM. Decreased soreness in shoulder following estim/heat         PT Education -  07/23/16 0930    Education provided Yes   Education Details HEP: re assessed exercises for home, strength and posture   Person(s) Educated Patient   Methods Explanation   Comprehension Verbalized understanding             PT Long Term Goals - 07/16/16 0912      PT LONG TERM GOAL #1   Title Patient will be independent in home exercise program to improve strength/mobility for better functional independence with ADLs b 09/10/2016.    Baseline limited knowledge of appropriate pain control strategies, exercise progression for strength and ROM   Status Revised     PT LONG TERM GOAL #2   Title Patient will improved Quick DASH score to 20% or less impairment demonstrating reduced self-reported upper extremity disability by 09/10/2016.    Baseline Quick dash 70%; current 07/16/16 40% impairment    Status Revised     PT LONG TERM GOAL #3   Title Patient will increase LUE shoulder AROM: sitting: Shoulder flexion >140 degrees, abduction: >120 degrees,  for increased functional ROM with ADLs such as grooming, dressing etc by 09/10/2016   Baseline AROM left shoulder sitting: flexion 70, abduction 60, IR to side of hip, ER severe limitation with functional ROM; unable to raise to ear; current 07/16/16 flexion 0-115, abduction 100, ER/IR WFL   Status Revised     PT LONG TERM GOAL #4   Title Patient will report pain level max. of 2/10 with right UE use by 09/10/2016 to allow improved function with overhead activities/personal care   Baseline pain leve max. 7/10 with using right UE for daily activities; current max level 5/10   Status Revised               Plan - 07/23/16 0916    Clinical Impression Statement Progressing well with stretching and scapular stabilization. She continues with decreased strength and ROM which limits ability to perform daily tasks without difficulty and will therefore benefit from continued physical therapy intervention.   Rehab Potential Fair   PT Frequency 2x /  week   PT Duration 8 weeks   PT Treatment/Interventions Iontophoresis 4mg /ml Dexamethasone;Electrical Stimulation;Cryotherapy;Moist Heat;Ultrasound;Patient/family education;Neuromuscular re-education;Therapeutic exercise;Manual techniques;Scar mobilization;Taping;Dry needling   PT Next Visit Plan modalities for pain control, muslce re education, ther. ex for motor control, strength, manual STM   PT Home Exercise Plan scapular control, scaption for movement, pain control with heat/TENS, isometric band exercises and scapular retraction band exercise      Patient will benefit from skilled therapeutic intervention in order to improve the following deficits and impairments:  Impaired UE functional use, Increased muscle spasms, Decreased activity tolerance, Decreased knowledge of precautions, Pain, Impaired perceived functional ability, Decreased strength  Visit Diagnosis: Stiffness of left shoulder, not elsewhere classified  Muscle weakness (generalized)  Left shoulder pain, unspecified chronicity     Problem List Patient Active Problem List   Diagnosis Date Noted  . Pulmonary hypertension 08/16/2012  . POTS (postural orthostatic tachycardia syndrome) 08/16/2012  . Chest pain 06/26/2012  . Shortness of breath  06/26/2012  . Rapid heart beat 06/26/2012    Beacher May PT 07/24/2016, 4:24 PM  [place] [facility] PHYSICAL AND SPORTS MEDICINE [address] [place], [address] Phone: [phone]   Fax:  [phone]  Name: KHYA HALLS MRN: [id number] Date of Birth: 21-Jan-1966

## 2016-07-25 ENCOUNTER — Ambulatory Visit: Payer: 59 | Admitting: Physical Therapy

## 2016-07-25 ENCOUNTER — Encounter: Payer: Self-pay | Admitting: Physical Therapy

## 2016-07-25 DIAGNOSIS — M25512 Pain in left shoulder: Secondary | ICD-10-CM

## 2016-07-25 DIAGNOSIS — M25612 Stiffness of left shoulder, not elsewhere classified: Secondary | ICD-10-CM

## 2016-07-25 DIAGNOSIS — M6281 Muscle weakness (generalized): Secondary | ICD-10-CM | POA: Diagnosis not present

## 2016-07-25 NOTE — Therapy (Signed)
[place] [facility] [address] [place], [name], [address] Phone: [phone]   Fax:  [phone]  Physical Therapy Treatment  Patient Details  Name: Claudia [name] MRN: [id number] Date of Birth: 1966-01-12 Referring Provider: Juanell FairlyKrasinski, Kevin, MD  Encounter Date: 07/25/2016      [name] End of Session - 07/25/16 0809    Visit Number 18   Number of Visits 32   Date for [name] Re-Evaluation 09/10/16   [name] Start Time 0802   [name] Stop Time 0850   [name] Time Calculation (min) 48 min   Activity Tolerance Patient tolerated treatment well   Behavior During Therapy [facility] for tasks assessed/performed      Past Medical History:  Diagnosis Date  . Arthritis    knees, back;   . Asthma    H/O YEARS AGO-NO INHALERS  . Difficult intubation   . [facility]rhythmia    [facility]  . Rotator cuff tear    left    Past Surgical History:  Procedure Laterality Date  . CARPAL TUNNEL RELEASE     x2  . CHOLECYSTECTOMY    . COLONOSCOPY    . SHOULDER ARTHROSCOPY WITH OPEN ROTATOR CUFF REPAIR Left 10/31/2015   Procedure: SHOULDER ARTHROSCOPY, SUBACROMIAL DECOMPRESSION, BICEPS TENOTOMY AND MINI OPEN ROTATOR CUFF REPAIR;  Surgeon: Juanell FairlyKevin Krasinski, MD;  Location: [facility] ORS;  Service: Orthopedics;  Laterality: Left;  . TONSILLECTOMY      There were no vitals filed for this visit.      Subjective Assessment - 07/25/16 0806    Subjective Patient reports no problems since last session.   Limitations House hold activities;Other (comment);Lifting   Patient Stated Goals pain relief to  be able to care for self, hair care, use left UE for housework, lifting pots, pans   Currently in Pain? Yes   Pain Score 1   on waking has stiffness 1/10   Pain Location Shoulder   Pain Orientation Left   Pain Descriptors / Indicators Discomfort;Tightness   Pain Type Chronic pain   Pain Onset More than a month ago   Pain Frequency Intermittent       Objective: AROM: left shoulder sitting pre treatment 0-110 with effort AAROM  Left shoulder supine lying 0-130 Palpation: increased tone/spasm left upper trapezius muscle and increased tenderness along anterior aspect of left shoulder  Treatment: Therapeutic exercise: patient performed exercises with verbal, tactile cues demonstration of therapist: Sitting/standing: At door frame x 10 scapular retraction, scaption UE Ranger forward flexion on floor x 25, rotations x 25 Standing forward flexion with UE Ranger x 25, rotations x 25 Supine: AAROM left shoulder flexion, abduction, rotations X 10 reps each through pain free range, pectoral stretch ( increased AAROM to 130 degrees flexion)  Manual therapy:  Supine  lying pectoral and anterior aspect of left shoulder STM superficial and deep techniques: goal improve ROM, decrease spasms  Modalities: Electrical stimulation: russian stimulation applied to medial border left scapula,10/10 cycle with patient performing scapular retraction with each cycle; high volt estim., for muscle spasms (2) electrodes applied to upper trapeziusand anterior aspect of left shoulder, intensity to comfort. patient seated in chair with UE's supported, moist heat applied to left shoulder during treatment x 20min. (no adverse reactions noted)  Patient response to treatment: patient demonstrated improved technique with exercises with minimal VC for correct alignment and to keep left shoulder from hiking. Patient continued with 1/10 pain level throughout session, no worse. Patient with decreased  spasms by 50% following STM. Decreased spasms and stiffness to 0/10 following estim. And moist heat treatment.        [name] Education - 07/25/16 0840    Education provided Yes   Education Details HEP; modify exercises and perform stretching and pain control and add resistive exercises tomorrow   Person(s) Educated Patient   Methods Explanation   Comprehension  Verbalized understanding             [name] Long Term Goals - 07/16/16 0912      [name] LONG TERM GOAL #1   Title Patient will be independent in home exercise program to improve strength/mobility for better functional independence with ADLs b 09/10/2016.    Baseline limited knowledge of appropriate pain control strategies, exercise progression for strength and ROM   Status Revised     [name] LONG TERM GOAL #2   Title Patient will improved Quick DASH score to 20% or less impairment demonstrating reduced self-reported upper extremity disability by 09/10/2016.    Baseline Quick dash 70%; current 07/16/16 40% impairment    Status Revised     [name] LONG TERM GOAL #3   Title Patient will increase LUE shoulder AROM: sitting: Shoulder flexion >140 degrees, abduction: >120 degrees,  for increased functional ROM with ADLs such as grooming, dressing etc by 09/10/2016   Baseline AROM left shoulder sitting: flexion 70, abduction 60, IR to side of hip, ER severe limitation with functional ROM; unable to raise to ear; current 07/16/16 flexion 0-115, abduction 100, ER/IR WFL   Status Revised     [name] LONG TERM GOAL #4   Title Patient will report pain level max. of 2/10 with right UE use by 09/10/2016 to allow improved function with overhead activities/personal care   Baseline pain leve max. 7/10 with using right UE for daily activities; current max level 5/10   Status Revised               Plan - 07/25/16 0833    Clinical Impression Statement Progressing well with current treatment, patient aggravated shoulder pain yesterday when taking out her hair extensions. She continues with weakness and pain and will benefit from continued physical therapy intervention to achieve goals, maximal functional use left UE   Rehab Potential Fair   [name] Frequency 2x / week   [name] Duration 8 weeks   [name] Treatment/Interventions Iontophoresis 4mg /ml Dexamethasone;Electrical Stimulation;Cryotherapy;Moist Heat;Ultrasound;Patient/family  education;Neuromuscular re-education;Therapeutic exercise;Manual techniques;Scar mobilization;Taping;Dry needling   [name] Next Visit Plan modalities for pain control, muslce re education, ther. ex for motor control, strength, manual STM   [name] Home Exercise Plan scapular control, scaption for movement, pain control with heat/TENS, isometric band exercises and scapular retraction band exercise      Patient will benefit from skilled therapeutic intervention in order to improve the following deficits and impairments:  Impaired UE functional use, Increased muscle spasms, Decreased activity tolerance, Decreased knowledge of precautions, Pain, Impaired perceived functional ability, Decreased strength  Visit Diagnosis: Stiffness of left shoulder, not elsewhere classified  Muscle weakness (generalized)  Left shoulder pain, unspecified chronicity     Problem List Patient Active Problem List   Diagnosis Date Noted  . Pulmonary hypertension 08/16/2012  . POTS (postural orthostatic tachycardia syndrome) 08/16/2012  . Chest pain 06/26/2012  . Shortness of breath 06/26/2012  . Rapid heart beat 06/26/2012    Beacher MayBrooks, Claudia [name] 07/25/2016, 10:04 PM  [facility] [address] [place], [name], [address] Phone: [phone]  Fax:  [phone]  Name: Claudia [name] MRN: [id number] Date of Birth: September 12, 1965

## 2016-07-29 ENCOUNTER — Ambulatory Visit: Payer: 59 | Attending: Orthopedic Surgery | Admitting: Physical Therapy

## 2016-07-29 ENCOUNTER — Encounter: Payer: Self-pay | Admitting: Physical Therapy

## 2016-07-29 DIAGNOSIS — M6281 Muscle weakness (generalized): Secondary | ICD-10-CM | POA: Insufficient documentation

## 2016-07-29 DIAGNOSIS — M25612 Stiffness of left shoulder, not elsewhere classified: Secondary | ICD-10-CM | POA: Insufficient documentation

## 2016-07-29 DIAGNOSIS — M25512 Pain in left shoulder: Secondary | ICD-10-CM | POA: Diagnosis not present

## 2016-07-29 NOTE — Therapy (Signed)
[facility] [facility] [address] [place], [name], [address] Phone: [phone]   Fax:  [phone]  Physical Therapy Treatment  Patient Details  Name: Claudia PilarLawana A Simm MRN: [id number] Date of Birth: May 17, 1966 Referring Provider: Juanell FairlyKrasinski, Kevin, MD  Encounter Date: 07/29/2016      PT End of Session - 07/29/16 1116    Visit Number 19   Number of Visits 32   Date for PT Re-Evaluation 09/10/16   PT Start Time 1026   PT Stop Time 1115   PT Time Calculation (min) 49 min   Activity Tolerance Patient tolerated treatment well   Behavior During Therapy [facility] for tasks assessed/performed      Past Medical History:  Diagnosis Date  . Arthritis    knees, back;   . Asthma    H/O YEARS AGO-NO INHALERS  . Difficult intubation   . Dysrhythmia    [facility]  . Rotator cuff tear    left    Past Surgical History:  Procedure Laterality Date  . CARPAL TUNNEL RELEASE     x2  . CHOLECYSTECTOMY    . COLONOSCOPY    . SHOULDER ARTHROSCOPY WITH OPEN ROTATOR CUFF REPAIR Left 10/31/2015   Procedure: SHOULDER ARTHROSCOPY, SUBACROMIAL DECOMPRESSION, BICEPS TENOTOMY AND MINI OPEN ROTATOR CUFF REPAIR;  Surgeon: Juanell FairlyKevin Krasinski, MD;  Location: [facility] ORS;  Service: Orthopedics;  Laterality: Left;  . TONSILLECTOMY      There were no vitals filed for this visit.      Subjective Assessment - 07/29/16 1029    Subjective Patient reports having some pain in left shoulder due to hair care and using left UE for overhead activity.   Limitations House hold activities;Other (comment);Lifting   Patient Stated Goals pain relief to  be able to care for self, hair care, use left UE for housework, lifting pots, pans   Currently in Pain? Yes   Pain Score 1   stiffness, increased to 2/10 with overhead activity (hair care)   Pain Location Shoulder   Pain Orientation Left   Pain Descriptors / Indicators  Aching;Discomfort;Tightness   Pain Type Chronic pain   Pain Onset More than a month ago   Pain Frequency Intermittent      Objective: AROM: left shoulder sitting pre treatment 0-105 AAROM  Left shoulder supine lying 0-140  Treatment: Therapeutic exercise: patient performed exercises with verbal, tactile cues demonstration of therapist: Sitting/standing: At door frame x 10 scapular retraction UE Ranger forward flexion with 2# weight above elbow, on floor x 25, rotations x 25 left UE Standing forward flexion with UE Ranger x 15 with 2# weight above elbow, rotations x 25 Cable seated row 5# x 15 both UE with palms up Standing straight arm pull downs 15# x 15 reps Supine: AAROM left shoulder flexion, with 2# weight above elbow, serratus punchesX 10 reps each through pain free range AAROM left shoulder 140 degrees at end of session  Modalities: Electrical stimulation: russian stimulation applied to medial border left scapula,10/10 cycle with patient performing scapular retraction with each cycle; high volt estim., for muscle spasms (2) electrodes applied to upper trapeziusand anterior aspect of left shoulder, intensity to comfort. patient seated in chair with UE's supported, moist heat applied to left shoulder during treatment x 15min. (no adverse reactions noted)  Patient response to treatment: Patient demonstrated improved AROM to 120 degrees from 105 degrees following standing exercises. Patient demonstrated improved technique with exercises following demonstraiton and with  VC/tactile cuing. Patient reported decreased soreness to mild/none following estim/moist heat. No adverse reaction noted following moist heat treatment.          PT Education - 07/29/16 1034    Education provided Yes   Education Details HEP: work on forward elevation on wall and supine lying (use weight in lying)   Person(s) Educated Patient   Methods Explanation;Demonstration;Verbal cues    Comprehension Verbalized understanding;Returned demonstration;Verbal cues required             PT Long Term Goals - 07/16/16 0912      PT LONG TERM GOAL #1   Title Patient will be independent in home exercise program to improve strength/mobility for better functional independence with ADLs b 09/10/2016.    Baseline limited knowledge of appropriate pain control strategies, exercise progression for strength and ROM   Status Revised     PT LONG TERM GOAL #2   Title Patient will improved Quick DASH score to 20% or less impairment demonstrating reduced self-reported upper extremity disability by 09/10/2016.    Baseline Quick dash 70%; current 07/16/16 40% impairment    Status Revised     PT LONG TERM GOAL #3   Title Patient will increase LUE shoulder AROM: sitting: Shoulder flexion >140 degrees, abduction: >120 degrees,  for increased functional ROM with ADLs such as grooming, dressing etc by 09/10/2016   Baseline AROM left shoulder sitting: flexion 70, abduction 60, IR to side of hip, ER severe limitation with functional ROM; unable to raise to ear; current 07/16/16 flexion 0-115, abduction 100, ER/IR WFL   Status Revised     PT LONG TERM GOAL #4   Title Patient will report pain level max. of 2/10 with right UE use by 09/10/2016 to allow improved function with overhead activities/personal care   Baseline pain leve max. 7/10 with using right UE for daily activities; current max level 5/10   Status Revised               Plan - 07/29/16 1115    Clinical Impression Statement Patient is progessing with strength as demonstrated with improved AROM in sitting to 120 degrees flexion. She continues with limitations with function above shoulder level due to decreased strength and endurance. She requires guidance and VC to perform all exercises correctly with correct intensity and positioning and will benefit from continued physical therapy intervention.     Rehab Potential Fair   Clinical  Impairments Affecting Rehab Potential (+) age, motivated (-)chronic pain right shoulder even following surgery 10/2015   PT Frequency 2x / week   PT Duration 8 weeks   PT Treatment/Interventions Iontophoresis 4mg /ml Dexamethasone;Electrical Stimulation;Cryotherapy;Moist Heat;Ultrasound;Patient/family education;Neuromuscular re-education;Therapeutic exercise;Manual techniques;Scar mobilization;Taping;Dry needling   PT Next Visit Plan modalities for pain control, muslce re education, ther. ex for motor control, strength, manual STM   PT Home Exercise Plan scapular control, scaption for movement, pain control with heat/TENS, isometric band exercises and scapular retraction band exercise      Patient will benefit from skilled therapeutic intervention in order to improve the following deficits and impairments:  Impaired UE functional use, Increased muscle spasms, Decreased activity tolerance, Decreased knowledge of precautions, Pain, Impaired perceived functional ability, Decreased strength  Visit Diagnosis: Stiffness of left shoulder, not elsewhere classified  Muscle weakness (generalized)  Left shoulder pain, unspecified chronicity     Problem List Patient Active Problem List   Diagnosis Date Noted  . Pulmonary hypertension 08/16/2012  . POTS (postural orthostatic tachycardia syndrome) 08/16/2012  . Chest  pain 06/26/2012  . Shortness of breath 06/26/2012  . Rapid heart beat 06/26/2012    Beacher MayBrooks, Marie PT 07/29/2016, 2:38 PM  [place] [facility] [address] [place], [name], [address] Phone: [phone]   Fax:  [phone]  Name: Claudia PilarLawana A [name] MRN: [id number] Date of Birth: [birth year]

## 2016-07-31 ENCOUNTER — Encounter: Payer: Self-pay | Admitting: Physical Therapy

## 2016-07-31 ENCOUNTER — Ambulatory Visit: Payer: 59 | Admitting: Physical Therapy

## 2016-07-31 DIAGNOSIS — M6281 Muscle weakness (generalized): Secondary | ICD-10-CM

## 2016-07-31 DIAGNOSIS — M25512 Pain in left shoulder: Secondary | ICD-10-CM

## 2016-07-31 DIAGNOSIS — M25612 Stiffness of left shoulder, not elsewhere classified: Secondary | ICD-10-CM | POA: Diagnosis not present

## 2016-07-31 NOTE — Therapy (Signed)
[place] [facility] [address] [place], [name], [address] Phone: [phone]   Fax:  [phone]  Physical Therapy Treatment  Patient Details  Name: Claudia [name] MRN: [id number] Date of Birth: 1965-09-07 Referring Provider: Juanell FairlyKrasinski, Kevin, MD  Encounter Date: 07/31/2016      PT End of Session - 07/31/16 0900    Visit Number 20   Number of Visits 32   Date for PT Re-Evaluation 09/10/16   PT Start Time 0755   PT Stop Time 0850   PT Time Calculation (min) 55 min   Activity Tolerance Patient tolerated treatment well   Behavior During Therapy [facility] for tasks assessed/performed      Past Medical History:  Diagnosis Date  . Arthritis    knees, back;   . Asthma    H/O YEARS AGO-NO INHALERS  . Difficult intubation   . D[facility]rhythmia    [facility]  . Rotator cuff tear    left    Past Surgical History:  Procedure Laterality Date  . CARPAL TUNNEL RELEASE     x2  . CHOLECYSTECTOMY    . COLONOSCOPY    . SHOULDER ARTHROSCOPY WITH OPEN ROTATOR CUFF REPAIR Left 10/31/2015   Procedure: SHOULDER ARTHROSCOPY, SUBACROMIAL DECOMPRESSION, BICEPS TENOTOMY AND MINI OPEN ROTATOR CUFF REPAIR;  Surgeon: Juanell FairlyKevin Krasinski, MD;  Location: [facility] OR[facility];  Service: Orthopedics;  Laterality: Left;  . TONSILLECTOMY      There were no vitals filed for this visit.      Subjective Assessment - 07/31/16 0758    Subjective Patient reports no problems from previous session. Sleeping still props her arm on pillows to assist with pain control. She has increased pain/spasms in left shoulder due to cleaning more around the home with swiffer.    Limitations House hold activities;Other (comment);Lifting   Patient Stated Goals pain relief to  be able to care for self, hair care, use left UE for housework, lifting pots, pans   Currently in Pain? Yes   Pain Score 1    Pain Location Shoulder   Pain Orientation Left   Pain  Descriptors / Indicators Aching;Discomfort;Tightness   Pain Type Chronic pain   Pain Onset More than a month ago   Pain Frequency Intermittent      Objective: AROM: left shoulder sitting pre treatment 0-105 Posture: hiking left shoulder  Treatment: Modalities: Ultrasound:  1MHz 1.3w/cm2 x 10 min. Pulsed 50% applied to left upper trapezius spasms/tendernes with patient seated prior to exercise: goal: pain Manual therapy: With patient seated in chair: performed superficial techniques STM to left upper trapezius: patient reported increased sensitivity and pain therefore performed US over this area Therapeutic exercise: patient performed exercises with verbal, tactile cues demonstration of therapist: Sitting/standing: At door frame x 10 scapular retraction scaption x 8 with hold at end range 3 seconds UE Ranger  on floor x 25 with mild manual resistance, rotations x 25 left UE Cable seated row 12# x 15 both UE with palms up Standing straight arm pull downs 12# x 15 reps Sitting: Red resistive band 2 x 10 forward flexion and scapular rows with assistance of therapist  Modalities: Electrical stimulation: russian stimulation applied to medial border left scapula,10/10 cycle with patient performing scapular retraction with each cycle; high volt estim., for muscle spasms (2) electrodes applied to upper trapeziusand anterior aspect of left shoulder, intensity to comfort. patient seated in chair with UE's supported, moist heat applied to left shoulder during  treatment x . (no adverse reactions noted)  Patient response to treatment: patient demonstrated improved technique with exercises with minimal VC for correct alignment.Patient with decreased spasms by 50% following STM/US. Improved motor control with repetition and cuing,  No adverse reaction noted following moist heat treatment.         PT Education - 07/31/16 0900    Education provided Yes   Education Details HEP:  modified exercises for left UE with resistive band around upper arm for scapular rows and forward flexion   Person(s) Educated Patient   Methods Explanation;Demonstration;Verbal cues   Comprehension Verbalized understanding;Returned demonstration;Verbal cues required             PT Long Term Goals - 07/16/16 0912      PT LONG TERM GOAL #1   Title Patient will be independent in home exercise program to improve strength/mobility for better functional independence with ADLs b 09/10/2016.    Baseline limited knowledge of appropriate pain control strategies, exercise progression for strength and ROM   Status Revised     PT LONG TERM GOAL #2   Title Patient will improved Quick DASH score to 20% or less impairment demonstrating reduced self-reported upper extremity disability by 09/10/2016.    Baseline Quick dash 70%; current 07/16/16 40% impairment    Status Revised     PT LONG TERM GOAL #3   Title Patient will increase LUE shoulder AROM: sitting: Shoulder flexion >140 degrees, abduction: >120 degrees,  for increased functional ROM with ADLs such as grooming, dressing etc by 09/10/2016   Baseline AROM left shoulder sitting: flexion 70, abduction 60, IR to side of hip, ER severe limitation with functional ROM; unable to raise to ear; current 07/16/16 flexion 0-115, abduction 100, ER/IR WFL   Status Revised     PT LONG TERM GOAL #4   Title Patient will report pain level max. of 2/10 with right UE use by 09/10/2016 to allow improved function with overhead activities/personal care   Baseline pain leve max. 7/10 with using right UE for daily activities; current max level 5/10   Status Revised               Plan - 07/31/16 0855    Clinical Impression Statement Patient demonstrates good carry over with left shoulder strength and is becoming more functional at home however her pain is increased and she continues to have spasms in left upper trapezius with hiking of shoulder with weakness  and decreased endurance for overhead activity.    Rehab Potential Fair   PT Frequency 2x / week   PT Duration 8 weeks   PT Treatment/Interventions Iontophoresis 4mg /ml Dexamethasone;Electrical Stimulation;Cryotherapy;Moist Heat;Ultrasound;Patient/family education;Neuromuscular re-education;Therapeutic exercise;Manual techniques;Scar mobilization;Taping;Dry needling   PT Next Visit Plan modalities for pain control, muslce re education, ther. ex for motor control, strength, manual STM   PT Home Exercise Plan scapular control, scaption for movement, pain control with heat/TENS, isometric band exercises and scapular retraction band exercise      Patient will benefit from skilled therapeutic intervention in order to improve the following deficits and impairments:  Impaired UE functional use, Increased muscle spasms, Decreased activity tolerance, Decreased knowledge of precautions, Pain, Impaired perceived functional ability, Decreased strength  Visit Diagnosis: Stiffness of left shoulder, not elsewhere classified  Muscle weakness (generalized)  Left shoulder pain, unspecified chronicity     Problem List Patient Active Problem List   Diagnosis Date Noted  . Pulmonary hypertension 08/16/2012  . POTS (postural orthostatic tachycardia syndrome) 08/16/2012  .  Chest pain 06/26/2012  . Shortness of breath 06/26/2012  . Rapid heart beat 06/26/2012    Beacher MayBrooks, Cheynne Virden PT 07/31/2016, 7:03 PM  [place] [facility] [address] [place], [name], [address] Phone: [phone]   Fax:  [phone]  Name: Claudia [name] MRN: [id number] Date of Birth: 02/21/1966

## 2016-08-05 ENCOUNTER — Ambulatory Visit: Payer: 59 | Admitting: Physical Therapy

## 2016-08-05 ENCOUNTER — Encounter: Payer: Self-pay | Admitting: Physical Therapy

## 2016-08-05 DIAGNOSIS — M25512 Pain in left shoulder: Secondary | ICD-10-CM

## 2016-08-05 DIAGNOSIS — M6281 Muscle weakness (generalized): Secondary | ICD-10-CM

## 2016-08-05 DIAGNOSIS — M25612 Stiffness of left shoulder, not elsewhere classified: Secondary | ICD-10-CM | POA: Diagnosis not present

## 2016-08-05 NOTE — Therapy (Signed)
[facility] [address] [place], [name], [address] Phone: [phone]   Fax:  [phone]  Physical Therapy Treatment  Patient Details  Name: Claudia PilarLawana A Seldon MRN: [id number] Date of Birth: 03-07-1966 Referring Provider: Juanell FairlyKrasinski, Kevin, MD  Encounter Date: 08/05/2016      PT End of Session - 08/05/16 0904    Visit Number 21   Number of Visits 32   Date for PT Re-Evaluation 09/10/16   PT Start Time 0803   PT Stop Time 0847   PT Time Calculation (min) 44 min   Activity Tolerance Patient tolerated treatment well   Behavior During Therapy [facility] for tasks assessed/performed      Past Medical History:  Diagnosis Date  . Arthritis    knees, back;   . Asthma    H/O YEARS AGO-NO INHALERS  . Difficult intubation   . [facility]    [facility]  . Rotator cuff tear    left    Past Surgical History:  Procedure Laterality Date  . CARPAL TUNNEL RELEASE     x2  . CHOLECYSTECTOMY    . COLONOSCOPY    . SHOULDER ARTHROSCOPY WITH OPEN ROTATOR CUFF REPAIR Left 10/31/2015   Procedure: SHOULDER ARTHROSCOPY, SUBACROMIAL DECOMPRESSION, BICEPS TENOTOMY AND MINI OPEN ROTATOR CUFF REPAIR;  Surgeon: Juanell FairlyKevin Krasinski, MD;  Location: [facility] ORS;  Service: Orthopedics;  Laterality: Left;  . TONSILLECTOMY      There were no vitals filed for this visit.      Subjective Assessment - 08/05/16 0806    Subjective Reports hurting today in left shoulder upper trapezius and around the front of her shoulder.    Limitations House hold activities;Other (comment);Lifting   Diagnostic tests had another MRI which showed partial thickness tear left shoulder/RTC: supraspinatus tendon   Patient Stated Goals pain relief to  be able to care for self, hair care, use left UE for housework, lifting pots, pans   Currently in Pain? Yes   Pain Score 2    Pain Location Shoulder   Pain Orientation Left   Pain Descriptors /  Indicators Aching;Discomfort   Pain Onset More than a month ago   Pain Frequency Intermittent     Objective: AROM: pre treatment: left shoulder flexion 0-105 Palpation; + spasms along upper trapezius into cervical spine left   Treatment: Modalities: Ultrasound/high volt combination:  1MHz 1.3w/cm2 x 10 min. Pulsed 50% and high volt estim. To tolerance (105 volts) applied to left upper trapezius spasms/tendernes with patient seated post exercise: goal: pain Manual therapy: With patient seated in chair: performed superficial techniques STM to left upper trapezius:   Therapeutic exercise: patient performed exercises with verbal, tactile cues demonstration of therapist: Sitting/standing: At door frame x 10 scapular retraction scaption x 8 with hold at end range 3 seconds UE Ranger  on floor flexion x 25 with mild manual resistance, rotations x 25 left UE UE ranger on wall x 25 reps flexion and rotations UBE x 2 min. Alternating every 15 seconds forward and backwards Cable single arm standing row 5# x 15each UE with palms up Standing straight arm pull downs 15# x 15 reps  Patient response to treatment: Patient demonstrated decreased spasms and improved flexibility in left shoulder following treatment. Patient with decreased spasms by 50% following STM/US. Improved motor control with repetition and cuing for scapular retraction and shoulder exercises        PT Education - 08/05/16 [id number]  Education provided Yes   Education Details HEP: continue with exercises and concentrate on stretching, ROM today, add single arm rows with double red band   Person(s) Educated Patient   Methods Explanation;Demonstration;Verbal cues   Comprehension Verbalized understanding;Returned demonstration;Verbal cues required             PT Long Term Goals - 07/16/16 0912      PT LONG TERM GOAL #1   Title Patient will be independent in home exercise program to improve strength/mobility for better  functional independence with ADLs b 09/10/2016.    Baseline limited knowledge of appropriate pain control strategies, exercise progression for strength and ROM   Status Revised     PT LONG TERM GOAL #2   Title Patient will improved Quick DASH score to 20% or less impairment demonstrating reduced self-reported upper extremity disability by 09/10/2016.    Baseline Quick dash 70%; current 07/16/16 40% impairment    Status Revised     PT LONG TERM GOAL #3   Title Patient will increase LUE shoulder AROM: sitting: Shoulder flexion >140 degrees, abduction: >120 degrees,  for increased functional ROM with ADLs such as grooming, dressing etc by 09/10/2016   Baseline AROM left shoulder sitting: flexion 70, abduction 60, IR to side of hip, ER severe limitation with functional ROM; unable to raise to ear; current 07/16/16 flexion 0-115, abduction 100, ER/IR WFL   Status Revised     PT LONG TERM GOAL #4   Title Patient will report pain level max. of 2/10 with right UE use by 09/10/2016 to allow improved function with overhead activities/personal care   Baseline pain leve max. 7/10 with using right UE for daily activities; current max level 5/10   Status Revised               Plan - 08/05/16 0905    Clinical Impression Statement Patient demonstrated improved flexiblity and decreased pain in left shoulder following treatment. She continues with pain, decreased ROM and strength and will benefit from continued physical therapy intervention to achieve goals for improved function left UE.    PT Frequency 2x / week   PT Duration 8 weeks   PT Treatment/Interventions Iontophoresis 4mg /ml Dexamethasone;Electrical Stimulation;Cryotherapy;Moist Heat;Ultrasound;Patient/family education;Neuromuscular re-education;Therapeutic exercise;Manual techniques;Scar mobilization;Taping;Dry needling   PT Next Visit Plan modalities for pain control, muslce re education, ther. ex for motor control, strength, manual STM   PT  Home Exercise Plan scapular control, scaption for movement, pain control with heat/TENS, isometric band exercises and scapular retraction band exercise      Patient will benefit from skilled therapeutic intervention in order to improve the following deficits and impairments:  Impaired UE functional use, Increased muscle spasms, Decreased activity tolerance, Decreased knowledge of precautions, Pain, Impaired perceived functional ability, Decreased strength  Visit Diagnosis: Stiffness of left shoulder, not elsewhere classified  Muscle weakness (generalized)  Left shoulder pain, unspecified chronicity     Problem List Patient Active Problem List   Diagnosis Date Noted  . Pulmonary hypertension 08/16/2012  . POTS (postural orthostatic tachycardia syndrome) 08/16/2012  . Chest pain 06/26/2012  . Shortness of breath 06/26/2012  . Rapid heart beat 06/26/2012    Beacher MayBrooks, Afua Hoots PT 08/05/2016, 9:09 AM  [place] [facility] [address] [place], [name], [address] Phone: [phone]   Fax:  [phone]  Name: Claudia PilarLawana A [name] MRN: [id number] Date of Birth: 1966/04/04

## 2016-08-07 ENCOUNTER — Ambulatory Visit: Payer: 59 | Admitting: Physical Therapy

## 2016-08-07 ENCOUNTER — Encounter: Payer: Self-pay | Admitting: Physical Therapy

## 2016-08-07 DIAGNOSIS — M25612 Stiffness of left shoulder, not elsewhere classified: Secondary | ICD-10-CM | POA: Diagnosis not present

## 2016-08-07 DIAGNOSIS — M25512 Pain in left shoulder: Secondary | ICD-10-CM

## 2016-08-07 DIAGNOSIS — M6281 Muscle weakness (generalized): Secondary | ICD-10-CM | POA: Diagnosis not present

## 2016-08-07 DIAGNOSIS — M75112 Incomplete rotator cuff tear or rupture of left shoulder, not specified as traumatic: Secondary | ICD-10-CM | POA: Diagnosis not present

## 2016-08-07 NOTE — Therapy (Signed)
[facility] [address], [place], [address] Phone: [phone]   Fax:  [phone]  Physical Therapy Treatment  Patient Details  Name: Claudia [name] MRN: [id number] Date of Birth: 02-21-1966 Referring Provider: Juanell Fairly, MD  Encounter Date: 08/07/2016      PT End of Session - 08/07/16 0855    Visit Number 22   Number of Visits 32   Date for PT Re-Evaluation 09/10/16   PT Start Time 0750   PT Stop Time 0833   PT Time Calculation (min) 43 min   Activity Tolerance Patient tolerated treatment well   Behavior During Therapy [facility] for tasks assessed/performed      Past Medical History:  Diagnosis Date  . Arthritis    knees, back;   . Asthma    H/O YEARS AGO-NO INHALERS  . Difficult intubation   . Dysrhythmia    H/O [facility] PROBLEMS SINCE 2013  . Rotator cuff tear    left    Past Surgical History:  Procedure Laterality Date  . CARPAL TUNNEL RELEASE     x2  . CHOLECYSTECTOMY    . COLONOSCOPY    . SHOULDER ARTHROSCOPY WITH OPEN ROTATOR CUFF REPAIR Left 10/31/2015   Procedure: SHOULDER ARTHROSCOPY, SUBACROMIAL DECOMPRESSION, BICEPS TENOTOMY AND MINI OPEN ROTATOR CUFF REPAIR;  Surgeon: Juanell Fairly, MD;  Location: [facility] ORS;  Service: Orthopedics;  Laterality: Left;  . TONSILLECTOMY      There were no vitals filed for this visit.      Subjective Assessment - 08/07/16 0753    Subjective Patient reports she is better today and feels weak today. She is exercising for strength.    Limitations House hold activities;Other (comment);Lifting   Patient Stated Goals pain relief to  be able to care for self, hair care, use left UE for housework, lifting pots, pans   Currently in Pain? Yes   Pain Score 1    Pain Location Shoulder   Pain Orientation Left   Pain Descriptors / Indicators Aching;Discomfort   Pain Type Chronic pain   Pain Onset More than a month ago   Pain Frequency  Intermittent      Objective: AROM: pre treatment: left shoulder flexion 0-110 : up to 120 following US/estim. Treatment and stretching with pulleys Palpation; + spasms along upper trapezius into cervical spine left   Treatment: Modalities: Ultrasound/high volt combination:  1.3w/cm2 x 10 min. Pulsed 50% and high volt estim. To tolerance (105 volts) applied to left upper trapezius spasms/tendernes with patient seated post exercise: goal: pain Therapeutic exercise: patient performed exercises with verbal, tactile cues demonstration of therapist: Sitting/standing: At door frame x 10 scapular retraction Pulleys x 3 min. For end range stretch with proper alignment of shoulder to avoid impingement UE ranger on wall x 25 reps flexion and rotations UBE x 3 min. Alternating every 15 seconds forward and backwards Cable single arm standing row 5# x 20 each UE  Standing straight arm pull downs 10# x 20 reps  Patient response to treatment: Patient demonstrated improved technique with pulleys and exercises with VC and demonstration. She improved AROM from 110 to 120 degrees following US/estim.Improved motor control with repetition and cuing for scapular retraction and shoulder exercises          PT Education - 08/07/16 0840    Education provided Yes   Education Details HEP: work on correct positioning with pulley with arm rotated in proper position to avoid impingement, resistive  band exercises, scapular retraction and flexion in lying for strength   Person(s) Educated Patient   Methods Explanation;Demonstration;Verbal cues   Comprehension Verbalized understanding;Returned demonstration;Verbal cues required             PT Long Term Goals - 07/16/16 0912      PT LONG TERM GOAL #1   Title Patient will be independent in home exercise program to improve strength/mobility for better functional independence with ADLs b 09/10/2016.    Baseline limited knowledge of appropriate pain  control strategies, exercise progression for strength and ROM   Status Revised     PT LONG TERM GOAL #2   Title Patient will improved Quick DASH score to 20% or less impairment demonstrating reduced self-reported upper extremity disability by 09/10/2016.    Baseline Quick dash 70%; current 07/16/16 40% impairment    Status Revised     PT LONG TERM GOAL #3   Title Patient will increase LUE shoulder AROM: sitting: Shoulder flexion >140 degrees, abduction: >120 degrees,  for increased functional ROM with ADLs such as grooming, dressing etc by 09/10/2016   Baseline AROM left shoulder sitting: flexion 70, abduction 60, IR to side of hip, ER severe limitation with functional ROM; unable to raise to ear; current 07/16/16 flexion 0-115, abduction 100, ER/IR WFL   Status Revised     PT LONG TERM GOAL #4   Title Patient will report pain level max. of 2/10 with right UE use by 09/10/2016 to allow improved function with overhead activities/personal care   Baseline pain leve max. 7/10 with using right UE for daily activities; current max level 5/10   Status Revised               Plan - 08/07/16 0840    Clinical Impression Statement Patient is progressing well with improved shoulder AROM to up to 120 degrees following US/estim. and exercises for stretching. She is more aware of her shoulder alignment with exercises and requires cuing and guidance to perform all exercises with proper technique and sequence. She continues to benefit from physical therapy interveniton as she progresses towards full ROM and strength with goal of transitioning to home program for self managemnent.   Rehab Potential Fair   PT Frequency 2x / week   PT Duration 8 weeks   PT Treatment/Interventions Iontophoresis 4mg /ml Dexamethasone;Electrical Stimulation;Cryotherapy;Moist Heat;Ultrasound;Patient/family education;Neuromuscular re-education;Therapeutic exercise;Manual techniques;Scar mobilization;Taping;Dry needling   PT Next  Visit Plan modalities for pain control, muslce re education, ther. ex for motor control, strength, manual STM   PT Home Exercise Plan scapular control, scaption for movement, pain control with heat/TENS, isometric band exercises and scapular retraction band exercise      Patient will benefit from skilled therapeutic intervention in order to improve the following deficits and impairments:  Impaired UE functional use, Increased muscle spasms, Decreased activity tolerance, Decreased knowledge of precautions, Pain, Impaired perceived functional ability, Decreased strength  Visit Diagnosis: Stiffness of left shoulder, not elsewhere classified  Muscle weakness (generalized)  Left shoulder pain, unspecified chronicity     Problem List Patient Active Problem List   Diagnosis Date Noted  . Pulmonary hypertension 08/16/2012  . POTS (postural orthostatic tachycardia syndrome) 08/16/2012  . Chest pain 06/26/2012  . Shortness of breath 06/26/2012  . Rapid heart beat 06/26/2012    Beacher MayBrooks, Marie PT 08/07/2016, 8:59 AM  [place] [facility] [address] [place], [name], [address] Phone: [phone]   Fax:  [phone]  Name: Secundino GingerLawana A  Broadus JohnWarren MRN: [id number] Date of Birth: 1966-02-17

## 2016-08-12 ENCOUNTER — Ambulatory Visit: Payer: 59 | Admitting: Physical Therapy

## 2016-08-12 ENCOUNTER — Encounter: Payer: Self-pay | Admitting: Physical Therapy

## 2016-08-12 DIAGNOSIS — M25612 Stiffness of left shoulder, not elsewhere classified: Secondary | ICD-10-CM

## 2016-08-12 DIAGNOSIS — M25512 Pain in left shoulder: Secondary | ICD-10-CM

## 2016-08-12 DIAGNOSIS — M6281 Muscle weakness (generalized): Secondary | ICD-10-CM

## 2016-08-12 NOTE — Therapy (Signed)
[place] [facility] [address] [place], [name], [address] Phone: [phone]   Fax:  [phone]  Physical Therapy Treatment  Patient Details  Name: Claudia [name] MRN: [id number] Date of Birth: 1965-12-29 Referring Provider: Juanell FairlyKrasinski, Kevin, MD  Encounter Date: 08/12/2016      PT End of Session - 08/12/16 0825    Visit Number 23   Number of Visits 32   Date for PT Re-Evaluation 09/10/16   PT Start Time 0800   PT Stop Time 0849   PT Time Calculation (min) 49 min   Activity Tolerance Patient tolerated treatment well   Behavior During Therapy [facility] for tasks assessed/performed      Past Medical History:  Diagnosis Date  . Arthritis    knees, back;   . Asthma    H/O YEARS AGO-NO INHALERS  . Difficult intubation   . D[facility]rhythmia    [facility]  . Rotator cuff tear    left    Past Surgical History:  Procedure Laterality Date  . CARPAL TUNNEL RELEASE     x2  . CHOLECYSTECTOMY    . COLONOSCOPY    . SHOULDER ARTHROSCOPY WITH OPEN ROTATOR CUFF REPAIR Left 10/31/2015   Procedure: SHOULDER ARTHROSCOPY, SUBACROMIAL DECOMPRESSION, BICEPS TENOTOMY AND MINI OPEN ROTATOR CUFF REPAIR;  Surgeon: Juanell FairlyKevin Krasinski, MD;  Location: [facility] ORS;  Service: Orthopedics;  Laterality: Left;  . TONSILLECTOMY      There were no vitals filed for this visit.      Subjective Assessment - 08/12/16 0804    Subjective Patient reports she is doing about the same. She is using her pulley and she is still not to lift over 5# and no overhead activities to avoid hurting her shoulder.    Limitations House hold activities;Other (comment);Lifting   Patient Stated Goals pain relief to  be able to care for self, hair care, use left UE for housework, lifting pots, pans   Currently in Pain? Yes   Pain Score 1    Pain Location Shoulder   Pain Orientation Left   Pain Descriptors / Indicators Discomfort   Pain Onset More  than a month ago   Pain Frequency Intermittent        Objective: AROM: pre treatment: left shoulder flexion 0-110 : up to 120 following US/estim and exercise.  Palpation; + spasms along upper trapezius into cervical spine left side and increased tenderness posterior aspect of left shoulder  Treatment: Modalities: Ultrasound/high volt combination:  1MHz 1.3w/cm2 x 10 min. Pulsed 50% and high volt estim. To tolerance (105 volts) applied to left upper trapezius spasms/tendernes with patient seated postexercise: goal: pain Therapeutic exercise: patient performed exercises with verbal, tactile cues demonstration of therapist: Sitting/standing: Pulleys x 3 min. For end range stretch with proper alignment of shoulder to avoid impingement UE ranger on wall x 25 reps flexion and rotations; UE ranger on floor flexion x 1 min. UBE x 3 min. Alternating every 30 seconds forward and backwards Cable single arm standing row 5# x 20 each UE  Bilateral rows with 10# x 15 reps Standing straight arm pull downs 15# x 20 reps On wall: ER of shoulders with scapular retraction, shoulder abduction short lever arm and through partial ROM with demonstration and repeated VC to perform with correct technique  Patient response to treatment: Patient demonstrated improved ROM to 118/120 degrees forward elevation post exercise. She improved technique and positioning with repetition and repeated VC. Improved motor  control with repetition and cuing for scapular retraction and shoulder exercises        PT Education - 08/12/16 0812    Education provided Yes   Education Details HEP: conitnue with stretching and strengthening as instructed using pulley, resistive bands.    Person(s) Educated Patient   Methods Explanation;Demonstration;Verbal cues   Comprehension Verbalized understanding;Returned demonstration;Verbal cues required             PT Long Term Goals - 07/16/16 0912      PT LONG TERM GOAL #1    Title Patient will be independent in home exercise program to improve strength/mobility for better functional independence with ADLs b 09/10/2016.    Baseline limited knowledge of appropriate pain control strategies, exercise progression for strength and ROM   Status Revised     PT LONG TERM GOAL #2   Title Patient will improved Quick DASH score to 20% or less impairment demonstrating reduced self-reported upper extremity disability by 09/10/2016.    Baseline Quick dash 70%; current 07/16/16 40% impairment    Status Revised     PT LONG TERM GOAL #3   Title Patient will increase LUE shoulder AROM: sitting: Shoulder flexion >140 degrees, abduction: >120 degrees,  for increased functional ROM with ADLs such as grooming, dressing etc by 09/10/2016   Baseline AROM left shoulder sitting: flexion 70, abduction 60, IR to side of hip, ER severe limitation with functional ROM; unable to raise to ear; current 07/16/16 flexion 0-115, abduction 100, ER/IR WFL   Status Revised     PT LONG TERM GOAL #4   Title Patient will report pain level max. of 2/10 with right UE use by 09/10/2016 to allow improved function with overhead activities/personal care   Baseline pain leve max. 7/10 with using right UE for daily activities; current max level 5/10   Status Revised               Plan - 08/12/16 0825    Clinical Impression Statement Patient is progressing with knowledge of appropriate exercises and repetitions to improve strength and flexiblity. She continues with limited ROM/strength that should continue to improve with additional physical therapy intervention.    Rehab Potential Fair   PT Frequency 2x / week   PT Duration 8 weeks   PT Treatment/Interventions Iontophoresis 4mg /ml Dexamethasone;Electrical Stimulation;Cryotherapy;Moist Heat;Ultrasound;Patient/family education;Neuromuscular re-education;Therapeutic exercise;Manual techniques;Scar mobilization;Taping;Dry needling   PT Next Visit Plan  modalities for pain control, muslce re education, ther. ex for motor control, strength, manual STM   PT Home Exercise Plan scapular control, scaption for movement, pain control with heat/TENS, isometric band exercises and scapular retraction band exercise      Patient will benefit from skilled therapeutic intervention in order to improve the following deficits and impairments:  Impaired UE functional use, Increased muscle spasms, Decreased activity tolerance, Decreased knowledge of precautions, Pain, Impaired perceived functional ability, Decreased strength  Visit Diagnosis: Stiffness of left shoulder, not elsewhere classified  Muscle weakness (generalized)  Left shoulder pain, unspecified chronicity     Problem List Patient Active Problem List   Diagnosis Date Noted  . Pulmonary hypertension 08/16/2012  . POTS (postural orthostatic tachycardia syndrome) 08/16/2012  . Chest pain 06/26/2012  . Shortness of breath 06/26/2012  . Rapid heart beat 06/26/2012   Beacher MayBrooks, Marie PT 08/12/2016, 2:50 PM  [place] [facility] [address] [place], [name], [address] Phone: [phone]   Fax:  [phone]  Name: Claudia [name] MRN: [id number]  Date of Birth: [birth year]

## 2016-08-14 ENCOUNTER — Ambulatory Visit: Payer: 59 | Admitting: Physical Therapy

## 2016-08-14 ENCOUNTER — Encounter: Payer: Self-pay | Admitting: Physical Therapy

## 2016-08-14 DIAGNOSIS — M25512 Pain in left shoulder: Secondary | ICD-10-CM | POA: Diagnosis not present

## 2016-08-14 DIAGNOSIS — M6281 Muscle weakness (generalized): Secondary | ICD-10-CM

## 2016-08-14 DIAGNOSIS — M25612 Stiffness of left shoulder, not elsewhere classified: Secondary | ICD-10-CM | POA: Diagnosis not present

## 2016-08-14 NOTE — Therapy (Signed)
[facility] [address] [place], [address] Phone: [phone]   Fax:  [phone]  Physical Therapy Treatment  Patient Details  Name: Claudia [name] MRN: [id number] Date of Birth: [birth year] Referring Provider: Juanell Fairly, MD  Encounter Date: 08/14/2016      PT End of Session - 08/14/16 0814    Visit Number 24   Number of Visits 32   Date for PT Re-Evaluation 09/10/16   PT Start Time 0800   PT Stop Time 0906   PT Time Calculation (min) 66 min   Activity Tolerance Patient tolerated treatment well   Behavior During Therapy [facility] for tasks assessed/performed      Past Medical History:  Diagnosis Date  . Arthritis    knees, back;   . Asthma    H/O YEARS AGO-NO INHALERS  . Difficult intubation   . Dysrhythmia    H/O [facility] PROBLEMS SINCE 2013  . Rotator cuff tear    left    Past Surgical History:  Procedure Laterality Date  . CARPAL TUNNEL RELEASE     x2  . CHOLECYSTECTOMY    . COLONOSCOPY    . SHOULDER ARTHROSCOPY WITH OPEN ROTATOR CUFF REPAIR Left 10/31/2015   Procedure: SHOULDER ARTHROSCOPY, SUBACROMIAL DECOMPRESSION, BICEPS TENOTOMY AND MINI OPEN ROTATOR CUFF REPAIR;  Surgeon: Juanell Fairly, MD;  Location: [facility] ORS;  Service: Orthopedics;  Laterality: Left;  . TONSILLECTOMY      There were no vitals filed for this visit.      Subjective Assessment - 08/14/16 0810    Subjective Left shoulder is sore this morning.    Limitations House hold activities;Other (comment);Lifting   Patient Stated Goals pain relief to  be able to care for self, hair care, use left UE for housework, lifting pots, pans   Currently in Pain? Yes   Pain Score 2    Pain Location Shoulder   Pain Orientation Left   Pain Descriptors / Indicators Discomfort;Aching   Pain Type Chronic pain   Pain Onset More than a month ago   Pain Frequency Intermittent        Objective: AROM: pre treatment: left  shoulder flexion 0-110   Palpation; + spasms along upper trapezius into cervical spine left side and increased tenderness posterior aspect of left shoulder  Treatment: Modalities: Electrical stimulation: Russian stim. 10/10 cycle applied electrodes to medial border of scapula and lower trapezius muscles; high volt estim applied to anterior aspect of shoulder and upper trapezius muscle x 20 min. With patient seated Moist heat applied to left shoulder x 10 min. Prior to exercise, ice pack applied post exercise with estim. For pain control; no adverse reaction noted Therapeutic exercise: patient performed exercises with verbal, tactile cues demonstration of therapist: Sitting/standing: Pulleys x 3 min. For end range stretch with proper alignment of shoulder to avoid impingement UE ranger on wall x 25 reps flexion and rotations UBE x . Alternating every 30 seconds forward and backwards Cable single arm standing row 5# x 20 each UE  Bilateral rows with 10# x 15 reps Standing straight arm pull downs 15# x 20reps Single arm extension with 5# x 15 reps each Supine lying: Serratus punches 2 x 10 Rhythmic stabilization flexion/extension 2 x 10 AAROM forward elevation and rotations, abduction through pain free range  Patient response to treatment: Patient demonstrated decreased soreness to mild, able to complete exercises with assistance and guidance of therapist with minimal cuing. Moderate fatigue noted in  shoulder with strengthening exercises        PT Education - 08/14/16 0813    Education provided Yes   Education Details HEP: re assessed pulleys and scapular retraction/control exercises   Person(s) Educated Patient   Methods Explanation;Demonstration;Verbal cues   Comprehension Verbalized understanding;Returned demonstration;Verbal cues required             PT Long Term Goals - 07/16/16 0912      PT LONG TERM GOAL #1   Title Patient will be independent in home exercise  program to improve strength/mobility for better functional independence with ADLs b 09/10/2016.    Baseline limited knowledge of appropriate pain control strategies, exercise progression for strength and ROM   Status Revised     PT LONG TERM GOAL #2   Title Patient will improved Quick DASH score to 20% or less impairment demonstrating reduced self-reported upper extremity disability by 09/10/2016.    Baseline Quick dash 70%; current 07/16/16 40% impairment    Status Revised     PT LONG TERM GOAL #3   Title Patient will increase LUE shoulder AROM: sitting: Shoulder flexion >140 degrees, abduction: >120 degrees,  for increased functional ROM with ADLs such as grooming, dressing etc by 09/10/2016   Baseline AROM left shoulder sitting: flexion 70, abduction 60, IR to side of hip, ER severe limitation with functional ROM; unable to raise to ear; current 07/16/16 flexion 0-115, abduction 100, ER/IR WFL   Status Revised     PT LONG TERM GOAL #4   Title Patient will report pain level max. of 2/10 with right UE use by 09/10/2016 to allow improved function with overhead activities/personal care   Baseline pain leve max. 7/10 with using right UE for daily activities; current max level 5/10   Status Revised               Plan - 08/14/16 0814    Clinical Impression Statement Patient is progressing with knowledge of pain control and modification of exercises to improve control and strength left shoulder. She continues to require assistance and cuing to perform exercises and continues with pain, stiffness and weakness that is limiting functional use left UE and will require additional physical therapy intervention to address limitations and achieve goals.    Rehab Potential Fair   PT Frequency 2x / week   PT Duration 8 weeks   PT Treatment/Interventions Iontophoresis 4mg /ml Dexamethasone;Electrical Stimulation;Cryotherapy;Moist Heat;Ultrasound;Patient/family education;Neuromuscular  re-education;Therapeutic exercise;Manual techniques;Scar mobilization;Taping;Dry needling   PT Next Visit Plan modalities for pain control, muslce re education, ther. ex for motor control, strength, manual STM   PT Home Exercise Plan scapular control, scaption for movement, pain control with heat/TENS, isometric band exercises and scapular retraction band exercise      Patient will benefit from skilled therapeutic intervention in order to improve the following deficits and impairments:  Impaired UE functional use, Increased muscle spasms, Decreased activity tolerance, Decreased knowledge of precautions, Pain, Impaired perceived functional ability, Decreased strength  Visit Diagnosis: Stiffness of left shoulder, not elsewhere classified  Muscle weakness (generalized)  Left shoulder pain, unspecified chronicity     Problem List Patient Active Problem List   Diagnosis Date Noted  . Pulmonary hypertension 08/16/2012  . POTS (postural orthostatic tachycardia syndrome) 08/16/2012  . Chest pain 06/26/2012  . Shortness of breath 06/26/2012  . Rapid heart beat 06/26/2012    Beacher MayBrooks, Marquell Saenz PT 08/15/2016, 2:01 PM  [facility] [address] [place], [name],  [address] Phone: [phone]   Fax:  [phone]  Name: Claudia [name] MRN: [id number] Date of Birth: 11/05/1965

## 2016-08-20 ENCOUNTER — Encounter: Payer: Self-pay | Admitting: Physical Therapy

## 2016-08-20 ENCOUNTER — Ambulatory Visit: Payer: 59 | Admitting: Physical Therapy

## 2016-08-20 DIAGNOSIS — M25612 Stiffness of left shoulder, not elsewhere classified: Secondary | ICD-10-CM

## 2016-08-20 DIAGNOSIS — M25512 Pain in left shoulder: Secondary | ICD-10-CM | POA: Diagnosis not present

## 2016-08-20 DIAGNOSIS — M6281 Muscle weakness (generalized): Secondary | ICD-10-CM

## 2016-08-20 NOTE — Therapy (Signed)
[facility] [address], [place], [address] Phone: [phone]   Fax:  [phone]  Physical Therapy Treatment  Patient Details  Name: Claudia [name] MRN: [id number] Date of Birth: Jan 14, 1966 Referring Provider: Juanell Fairly, MD  Encounter Date: 08/20/2016      PT End of Session - 08/20/16 0806    Visit Number 25   Number of Visits 32   Date for PT Re-Evaluation 09/10/16   PT Start Time 0755   PT Stop Time 0850   PT Time Calculation (min) 55 min   Activity Tolerance Patient tolerated treatment well   Behavior During Therapy [facility] for tasks assessed/performed      Past Medical History:  Diagnosis Date  . Arthritis    knees, back;   . Asthma    H/O YEARS AGO-NO INHALERS  . Difficult intubation   . Dysrhythmia    H/O [facility] PROBLEMS SINCE 2013  . Rotator cuff tear    left    Past Surgical History:  Procedure Laterality Date  . CARPAL TUNNEL RELEASE     x2  . CHOLECYSTECTOMY    . COLONOSCOPY    . SHOULDER ARTHROSCOPY WITH OPEN ROTATOR CUFF REPAIR Left 10/31/2015   Procedure: SHOULDER ARTHROSCOPY, SUBACROMIAL DECOMPRESSION, BICEPS TENOTOMY AND MINI OPEN ROTATOR CUFF REPAIR;  Surgeon: Juanell Fairly, MD;  Location: [facility] ORS;  Service: Orthopedics;  Laterality: Left;  . TONSILLECTOMY      There were no vitals filed for this visit.      Subjective Assessment - 08/20/16 0800    Subjective Left shoulder tender and sore this morning. She took Tylenol at 4 am and feels this is helping. She paced herself over the weekend with the holiday and did well.   Limitations House hold activities;Other (comment);Lifting   Patient Stated Goals pain relief to  be able to care for self, hair care, use left UE for housework, lifting pots, pans   Currently in Pain? Yes   Pain Score 1    Pain Location Shoulder   Pain Orientation Left   Pain Descriptors / Indicators Discomfort;Tender   Pain Type  Chronic pain   Pain Onset More than a month ago   Pain Frequency Intermittent      Objective: AROM: pre treatment: left shoulder flexion 0-110   Posture: hiking left shoulder with forward elevation Strength; decreased periscapular strength and control left shoulder  Treatment: Moist heat applied to left shoulder x 10 min. post exercise, ice pack applied post exercise with estim. For pain control; no adverse reaction noted Therapeutic exercise: patient performed exercises with verbal, tactile cues demonstration of therapist: Sitting/standing: Pulleys x 3 min. For end range stretch with proper alignment of shoulder to avoid impingement and gradual increase in ROM to be able to increase ROM without increase pain UBE @ 120 rpms x . Alternating every 30seconds forward and backwards Cable single arm standing row 5# x 20 left UE Bilateral rows with 10# x 15 reps Single arm extension with 5# x 15 reps left UE Standing single arm row 1 x 10 with 2#, 1 x 10 with 3# dumbbell Standing single arm shoulder extension 2# x 10, 3# x 10 using dumbbell  Supine lying: Serratus punches 2# weight in hand  x 10 with guided motion of therapist Rhythmic stabilization flexion/extension  x 10 AAROM forward elevation with 2# weight and both hands holding 2# weight AAROM with assist of therapist to 150 degrees flexion stretch/pain at  end range  Patient response to treatment: patient demonstrated improved technique with exercises with minimal VC and repetition for correct alignment. Improved AAROM to 150 degrees with repetition and stretching.  No worse pain following exercises, mild to moderate fatigue with exercises         PT Education - 08/20/16 0804    Education provided Yes   Education Details HEP: re assessed home exercises and strengthening    Person(s) Educated Patient   Methods Explanation;Demonstration;Verbal cues   Comprehension Verbalized understanding;Returned demonstration;Verbal  cues required             PT Long Term Goals - 07/16/16 0912      PT LONG TERM GOAL #1   Title Patient will be independent in home exercise program to improve strength/mobility for better functional independence with ADLs b 09/10/2016.    Baseline limited knowledge of appropriate pain control strategies, exercise progression for strength and ROM   Status Revised     PT LONG TERM GOAL #2   Title Patient will improved Quick DASH score to 20% or less impairment demonstrating reduced self-reported upper extremity disability by 09/10/2016.    Baseline Quick dash 70%; current 07/16/16 40% impairment    Status Revised     PT LONG TERM GOAL #3   Title Patient will increase LUE shoulder AROM: sitting: Shoulder flexion >140 degrees, abduction: >120 degrees,  for increased functional ROM with ADLs such as grooming, dressing etc by 09/10/2016   Baseline AROM left shoulder sitting: flexion 70, abduction 60, IR to side of hip, ER severe limitation with functional ROM; unable to raise to ear; current 07/16/16 flexion 0-115, abduction 100, ER/IR WFL   Status Revised     PT LONG TERM GOAL #4   Title Patient will report pain level max. of 2/10 with right UE use by 09/10/2016 to allow improved function with overhead activities/personal care   Baseline pain leve max. 7/10 with using right UE for daily activities; current max level 5/10   Status Revised               Plan - 08/20/16 0900    Clinical Impression Statement Patient demonstrated improved strength, ability to perform exercise through modified ROM with improved motor control and alignment of shoulder. She continues with decreased strength and endurance and will require additional physical therapy intervention to achieve goals.   Rehab Potential Fair   PT Frequency 2x / week   PT Duration 8 weeks   PT Treatment/Interventions Iontophoresis 4mg /ml Dexamethasone;Electrical Stimulation;Cryotherapy;Moist Heat;Ultrasound;Patient/family  education;Neuromuscular re-education;Therapeutic exercise;Manual techniques;Scar mobilization;Taping;Dry needling   PT Next Visit Plan modalities for pain control, muslce re education, ther. ex for motor control, strength, manual STM   PT Home Exercise Plan scapular control, scaption for movement, pain control with heat/TENS, isometric band exercises and scapular retraction band exercise      Patient will benefit from skilled therapeutic intervention in order to improve the following deficits and impairments:  Impaired UE functional use, Increased muscle spasms, Decreased activity tolerance, Decreased knowledge of precautions, Pain, Impaired perceived functional ability, Decreased strength  Visit Diagnosis: Stiffness of left shoulder, not elsewhere classified  Muscle weakness (generalized)  Left shoulder pain, unspecified chronicity     Problem List Patient Active Problem List   Diagnosis Date Noted  . Pulmonary hypertension 08/16/2012  . POTS (postural orthostatic tachycardia syndrome) 08/16/2012  . Chest pain 06/26/2012  . Shortness of breath 06/26/2012  . Rapid heart beat 06/26/2012    Beacher MayBrooks, Marie PT 08/20/2016, 2:17  PM  [facility] [address] [place], [name], [address] Phone: [phone]   Fax:  [phone]  Name: Hortencia PilarLawana A Bjorklund MRN: [id number] Date of Birth: [birth year]

## 2016-08-22 ENCOUNTER — Encounter: Payer: Self-pay | Admitting: Physical Therapy

## 2016-08-22 ENCOUNTER — Ambulatory Visit: Payer: 59 | Admitting: Physical Therapy

## 2016-08-22 DIAGNOSIS — M25612 Stiffness of left shoulder, not elsewhere classified: Secondary | ICD-10-CM

## 2016-08-22 DIAGNOSIS — M6281 Muscle weakness (generalized): Secondary | ICD-10-CM | POA: Diagnosis not present

## 2016-08-22 DIAGNOSIS — M25512 Pain in left shoulder: Secondary | ICD-10-CM | POA: Diagnosis not present

## 2016-08-22 NOTE — Therapy (Signed)
[facility] [address] [place], [place], [address] Phone: [phone]   Fax:  [phone]  Physical Therapy Treatment  Patient Details  Name: Claudia [name] MRN: [id number] Date of Birth: [birth year] Referring Provider: Krasinski, Kevin, MD  Encounter Date: 08/22/2016      PT End of Session - 08/22/16 0802    Visit Number 26   Number of Visits 32   Date for PT Re-Evaluation 09/10/16   PT Start Time 0757   PT Stop Time 0851   PT Time Calculation (min) 54 min   Activity Tolerance Patient tolerated treatment well   Behavior During Therapy [facility] for tasks assessed/performed      Past Medical History:  Diagnosis Date  . Arthritis    knees, back;   . Asthma    H/O YEARS AGO-NO INHALERS  . Difficult intubation   . Dysrhythmia    H/O TACHYCARDIA-NO PROBLEMS SINCE 2013  . Rotator cuff tear    left    Past Surgical History:  Procedure Laterality Date  . CARPAL TUNNEL RELEASE     x2  . CHOLECYSTECTOMY    . COLONOSCOPY    . SHOULDER ARTHROSCOPY WITH OPEN ROTATOR CUFF REPAIR Left 10/31/2015   Procedure: SHOULDER ARTHROSCOPY, SUBACROMIAL DECOMPRESSION, BICEPS TENOTOMY AND MINI OPEN ROTATOR CUFF REPAIR;  Surgeon: Kevin Krasinski, MD;  Location: [facility] ORS;  Service: Orthopedics;  Laterality: Left;  . TONSILLECTOMY      There were no vitals filed for this visit.      Subjective Assessment - 08/22/16 0758    Subjective Left shoulder tender and left elbow is swollen around the joint today (this happens intermittently and she self manages it with anti inflammatory medication)   Limitations House hold activities;Other (comment);Lifting   Patient Stated Goals pain relief to  be able to care for self, hair care, use left UE for housework, lifting pots, pans   Currently in Pain? Yes   Pain Score 1    Pain Location Shoulder  and elbow   Pain Orientation Left   Pain Descriptors / Indicators Tender;Sore   Pain Type  Chronic pain   Pain Onset More than a month ago   Pain Frequency Intermittent      Objective: AROM: pre treatment: left shoulder flexion 0-105 Observation: mild swelling medial aspect left elbow with mild warmth, equal to right elbow, no point tenderness noted    Treatment: Therapeutic exercise: patient performed exercises with verbal, tactile cues demonstration of therapist: Sitting/standing: Pulleys x 3 min. For end range stretch with proper alignment of shoulder to avoid impingement and gradual increase in ROM to be able to increase ROM without increase pain UBE @ 120 [phone]-West Irwin [phone] LinwoSelect Specialty Hospital Belhavenh<[phone] Star Behavioral Health [phone] Regional [phone] RapidMaryland305 655 [phone] Surgical Hospital Of San [phone] [phone]-Farber Cancer [phone] [phone] Endoscopy And [phone] Shore Same Day Surgery Dba North Shore [phone] Regional Medical Centerh<[phone] CampbelWashington Hospital - [phone] [phone] Regional Hospitalh<[phone] [phone] Disease [phone] Rio Grande Regional [phone] Valley Southwest Health [phone] Teays [phone] [phone] Washington Pediatric Hospitalh<[phone] [phone] Day Surgery LLC Dba Naples Day [phone] City Area [phone] HavenExcela Health Frick Hospitalherre Robinss GipEASUREMENTHenrietta D Goodall Hospital Wall exercises with ball: Isometric IR, ER, flexion x 10 each Press into ball roll up/down x 10 for stabilization Forward flexion holding ball between hands to 90 degrees with assistance of therapist for left UE x 10 *Cable single arm standing row 5# x 20 left UE (hold today) *Bilateral rows with 10# x 15 reps (hold today) *Single arm extension with 5# x 15 reps left UE (hold today) *Standing single arm row 1 x 10 with 2#, 1 x 10 with 3# dumbbell (hold today) *Standing single arm shoulder extension 2# x 10, 3# x 10 using dumbbell (hold today)  Supine lying: AAROM forward elevation with assist of therapist and manual therapy STM to pectoral muscle AAROM with assist of therapist to  150 degrees flexion stretch/pain at end range  Modalities: Electrical stimulation: Russian stim. 10/10 cycle applied electrodes to medial border of scapula and lower trapezius muscles; high volt estim applied to anterior aspect of shoulder and upper trapezius muscle x 15 min. With patient seated Moist heat x 15 min. To left shoulder with estim.; pain control; no adverse reactions noted  Patient response to treatment: Patient with increased pain with stretching and manual therapy today. Improved to 2/10 following estim and moist heat at end of session. Patient  demonstrated improved technique with isometric exercises at wall with repetition and VC; patient demonstrated mild to moderate fatigue with exercises         PT Education - 08/22/16 0801    Education provided Yes   Education Details HEP: isometric shoulder exercises with ball   Person(s) Educated Patient   Methods Explanation;Demonstration;Verbal cues   Comprehension Verbalized understanding;Returned demonstration;Verbal cues required             PT Long Term Goals - 07/16/16 0912      PT LONG TERM GOAL #1   Title Patient will be independent in home exercise program to improve strength/mobility for better functional independence with ADLs b 09/10/2016.    Baseline limited knowledge of appropriate pain control strategies, exercise progression for strength and ROM   Status Revised     PT LONG TERM GOAL #2   Title Patient will improved Quick DASH score to 20% or less impairment demonstrating reduced self-reported upper extremity disability by 09/10/2016.    Baseline Quick dash 70%; current 07/16/16 40% impairment    Status Revised     PT LONG TERM GOAL #3   Title Patient will increase LUE shoulder AROM: sitting: Shoulder flexion >140 degrees, abduction: >120 degrees,  for increased functional ROM with ADLs such as grooming, dressing etc by 09/10/2016   Baseline AROM left shoulder sitting: flexion 70, abduction 60, IR to side of hip, ER severe limitation with functional ROM; unable to raise to ear; current 07/16/16 flexion 0-115, abduction 100, ER/IR WFL   Status Revised     PT LONG TERM GOAL #4   Title Patient will report pain level max. of 2/10 with right UE use by 09/10/2016 to allow improved function with overhead activities/personal care   Baseline pain leve max. 7/10 with using right UE for daily activities; current max level 5/10   Status Revised               Plan - 08/22/16 0803    Clinical Impression Statement Patient is progressing well with exercises and  knowledge of home exercises. Increased pain with exercises today; patient reported decreased pain to 2/10 following moist heat and estim. She continues with weakness and pain that need to be addressed in order to be able to return to maximal functional use left UE.   Rehab Potential Fair   PT Frequency 2x / week   PT Duration 8 weeks   PT Treatment/Interventions Iontophoresis 4mg /ml Dexamethasone;Electrical Stimulation;Cryotherapy;Moist Heat;Ultrasound;Patient/family education;Neuromuscular re-education;Therapeutic exercise;Manual techniques;Scar mobilization;Taping;Dry needling   PT Next Visit Plan modalities for pain control, muslce re education, ther. ex for motor control, strength, manual STM   PT Home Exercise Plan scapular control, scaption for movement, pain control with heat/TENS, isometric band exercises and scapular retraction band exercise      Patient will benefit from skilled therapeutic intervention in order to improve the following deficits and impairments:  Impaired UE functional use, Increased muscle spasms, Decreased activity tolerance, Decreased knowledge of precautions,  Pain, Impaired perceived functional ability, Decreased strength  Visit Diagnosis: Muscle weakness (generalized)  Stiffness of left shoulder, not elsewhere classified  Left shoulder pain, unspecified chronicity     Problem List Patient Active Problem List   Diagnosis Date Noted  . Pulmonary hypertension 08/16/2012  . POTS (postural orthostatic tachycardia syndrome) 08/16/2012  . Chest pain 06/26/2012  . Shortness of breath 06/26/2012  . Rapid heart beat 06/26/2012    Beacher MayBrooks, Sharmayne Jablon PT 08/22/2016, 9:03 AM  [facility] [address] [place], [name], [address] Phone: [phone]   Fax:  [phone]  Name: Claudia [name] MRN: [id number] Date of Birth: 1965/11/30

## 2016-08-29 ENCOUNTER — Ambulatory Visit: Payer: 59 | Admitting: Physical Therapy

## 2016-09-03 ENCOUNTER — Encounter: Payer: Self-pay | Admitting: Physical Therapy

## 2016-09-03 ENCOUNTER — Ambulatory Visit: Payer: 59 | Attending: Orthopedic Surgery | Admitting: Physical Therapy

## 2016-09-03 DIAGNOSIS — M25612 Stiffness of left shoulder, not elsewhere classified: Secondary | ICD-10-CM | POA: Insufficient documentation

## 2016-09-03 DIAGNOSIS — M6281 Muscle weakness (generalized): Secondary | ICD-10-CM | POA: Insufficient documentation

## 2016-09-03 DIAGNOSIS — M25512 Pain in left shoulder: Secondary | ICD-10-CM | POA: Diagnosis not present

## 2016-09-03 NOTE — Therapy (Signed)
[place] [facility] PHYS[facility] AND [facility]TS MEDICINE [address], [place], [address] Phone: [phone]   Fax:  [phone]  Physical Therapy Treatment  Patient Details  Name: Claudia [name] MRN: [id number] Date of Birth: [birth year] Referring Provider: Juanell Fairly, MD  Encounter Date: 09/03/2016      PT End of Session - 09/03/16 0837    Visit Number 27   Number of Visits 32   Date for PT Re-Evaluation 09/10/16   PT Start Time 0755   PT Stop Time 0855   PT Time Calculation (min) 60 min   Activity Tolerance Patient tolerated treatment well;Patient limited by pain   Behavior During Therapy [facility] for tasks assessed/performed      Past Medical History:  Diagnosis Date  . Arthritis    knees, back;   . Asthma    H/O YEARS AGO-NO INHALERS  . Difficult intubation   . Dysrhythmia    H/O [facility] PROBLEMS SINCE 2013  . Rotator cuff tear    left    Past Surgical History:  Procedure Laterality Date  . CARPAL TUNNEL RELEASE     x2  . CHOLECYSTECTOMY    . COLONOSCOPY    . SHOULDER ARTHROSCOPY WITH OPEN ROTATOR CUFF REPAIR Left 10/31/2015   Procedure: SHOULDER ARTHROSCOPY, SUBACROMIAL DECOMPRESSION, BICEPS TENOTOMY AND MINI OPEN ROTATOR CUFF REPAIR;  Surgeon: Juanell Fairly, MD;  Location: [facility] ORS;  Service: Orthopedics;  Laterality: Left;  . TONSILLECTOMY      There were no vitals filed for this visit.      Subjective Assessment - 09/03/16 0757    Subjective Patient reports she is still having difficulty with raising left arm overhead for hair care. She reports she has a burning feeling in her left arm elbow with reaching across to opposite side of head.    Limitations House hold activities;Other (comment);Lifting   Patient Stated Goals pain relief to  be able to care for self, hair care, use left UE for housework, lifting pots, pans   Currently in Pain? Yes   Pain Score 2    Pain Location Shoulder   Pain Orientation Left   Pain  Descriptors / Indicators Aching;Tender;Sore   Pain Type Chronic pain   Pain Onset More than a month ago   Pain Frequency Intermittent        Objective: AROM: pre treatment: left shoulder flexion 0-105 Observation: mild swelling medial aspect left elbow with mild warmth, no point tenderness noted    Treatment: Therapeutic exercise: patient performed exercises with verbal, tactile cues demonstration of therapist: Supine lying: Isometric IR, ER, flexion x 10 each AAROM forward elevation with assist of therapist and manual therapy STM to pectoral muscle AAROM with assist of therapist to 140 degrees flexion stretch/pain at end range Manual therapy: STM left upper trapezius, posterior aspect of left shoulder with patient seated, supine Joint mobilization GHJ lateral distraction grade 2-3 with caudal glide x 10 reps supine lying in conjunction with exercises  Modalities: Ultrasound; continuous (100%) 1.3w/cm2 x 10 min. Applied to left shoulder upper trapezius, posterior aspect: goals increase tissue elasticity, pain, followed by estim. Electrical stimulation: Russian stim. 10/10 cycle applied electrodes to medial border of scapula and lower trapezius muscles; high volt estim applied to anterior aspect of shoulder and upper trapezius muscle x 20 min. With patient seated Moist heat x 20 min. To left shoulder with estim.; pain control; no adverse reactions noted  Patient response to treatment: Patient with pain throughout exercises today, no  worse at end of session. Improved AROM to 110 degrees at end of session with stiffness and pain. verbalized good understanding of precautions and positions to avoid impingement of left shoulder. Improved pain level to 2/10 following estim and moist heat at end of session.         PT Education - 09/03/16 0759    Education provided Yes   Education Details HEP: isometric shoulder exercises with ball, continue with stretching with pulleys; re  instructed in positions to avoid with reaching across body to do hair, and decrease overhead reaching with left UE.    Person(s) Educated Patient   Methods Explanation;Demonstration;Verbal cues   Comprehension Returned demonstration;Verbal cues required;Verbalized understanding             PT Long Term Goals - 07/16/16 0912      PT LONG TERM GOAL #1   Title Patient will be independent in home exercise program to improve strength/mobility for better functional independence with ADLs b 09/10/2016.    Baseline limited knowledge of appropriate pain control strategies, exercise progression for strength and ROM   Status Revised     PT LONG TERM GOAL #2   Title Patient will improved Quick DASH score to 20% or less impairment demonstrating reduced self-reported upper extremity disability by 09/10/2016.    Baseline Quick dash 70%; current 07/16/16 40% impairment    Status Revised     PT LONG TERM GOAL #3   Title Patient will increase LUE shoulder AROM: sitting: Shoulder flexion >140 degrees, abduction: >120 degrees,  for increased functional ROM with ADLs such as grooming, dressing etc by 09/10/2016   Baseline AROM left shoulder sitting: flexion 70, abduction 60, IR to side of hip, ER severe limitation with functional ROM; unable to raise to ear; current 07/16/16 flexion 0-115, abduction 100, ER/IR WFL   Status Revised     PT LONG TERM GOAL #4   Title Patient will report pain level max. of 2/10 with right UE use by 09/10/2016 to allow improved function with overhead activities/personal care   Baseline pain leve max. 7/10 with using right UE for daily activities; current max level 5/10   Status Revised               Plan - 09/03/16 4098    Clinical Impression Statement Concentrated on pain control and scapular motor control today to alleviate pain in left UE. minimal results noted. Patient continues with pain, stiffness and weakness as limitations to being able to use left UE without  difficulty for overhead activities. she also continues with increased warmth medial aspect of left elbow and will contact MD regarding.    Rehab Potential Fair   PT Frequency 2x / week   PT Duration 8 weeks   PT Treatment/Interventions Iontophoresis 4mg /ml Dexamethasone;Electrical Stimulation;Cryotherapy;Moist Heat;Ultrasound;Patient/family education;Neuromuscular re-education;Therapeutic exercise;Manual techniques;Scar mobilization;Taping;Dry needling   PT Next Visit Plan modalities for pain control, muslce re education, ther. ex for motor control, strength, manual STM   PT Home Exercise Plan scapular control, scaption for movement, pain control with heat/TENS, isometric band exercises and scapular retraction band exercise      Patient will benefit from skilled therapeutic intervention in order to improve the following deficits and impairments:  Impaired UE functional use, Increased muscle spasms, Decreased activity tolerance, Decreased knowledge of precautions, Pain, Impaired perceived functional ability, Decreased strength  Visit Diagnosis: Muscle weakness (generalized)  Stiffness of left shoulder, not elsewhere classified  Left shoulder pain, unspecified chronicity     Problem List  Patient Active Problem List   Diagnosis Date Noted  . Pulmonary hypertension 08/16/2012  . POTS (postural orthostatic tachycardia syndrome) 08/16/2012  . Chest pain 06/26/2012  . Shortness of breath 06/26/2012  . Rapid heart beat 06/26/2012    Beacher MayBrooks, Kiylee Thoreson PT 09/03/2016, 9:04 AM  [facility] [address] [place], [name], [address] Phone: [phone]   Fax:  [phone]  Name: Hortencia PilarLawana A Kosier MRN: [id number] Date of Birth: [birth year]

## 2016-09-05 ENCOUNTER — Encounter: Payer: Self-pay | Admitting: Physical Therapy

## 2016-09-05 ENCOUNTER — Ambulatory Visit: Payer: 59 | Admitting: Physical Therapy

## 2016-09-05 DIAGNOSIS — M25612 Stiffness of left shoulder, not elsewhere classified: Secondary | ICD-10-CM | POA: Diagnosis not present

## 2016-09-05 DIAGNOSIS — M25512 Pain in left shoulder: Secondary | ICD-10-CM

## 2016-09-05 DIAGNOSIS — M25529 Pain in unspecified elbow: Secondary | ICD-10-CM | POA: Diagnosis not present

## 2016-09-05 DIAGNOSIS — M6281 Muscle weakness (generalized): Secondary | ICD-10-CM

## 2016-09-05 NOTE — Therapy (Signed)
[place] [facility] [address] [place], [name], [address] Phone: [phone]   Fax:  [phone]  Physical Therapy Treatment  Patient Details  Name: Claudia PilarLawana A [name] MRN: [id number] Date of Birth: 07-01-1966 Referring Provider: Juanell FairlyKrasinski, Kevin, MD  Encounter Date: 09/05/2016      PT End of Session - 09/05/16 0803    Visit Number 28   Number of Visits 32   Date for PT Re-Evaluation 09/10/16   PT Start Time 0800   PT Stop Time 0839   PT Time Calculation (min) 39 min   Activity Tolerance Patient tolerated treatment well;Patient limited by pain   Behavior During Therapy [facility] for tasks assessed/performed      Past Medical History:  Diagnosis Date  . Arthritis    knees, back;   . Asthma    H/O YEARS AGO-NO INHALERS  . Difficult intubation   . Dysrhythmia    [facility] SINCE 2013  . Rotator cuff tear    left    Past Surgical History:  Procedure Laterality Date  . CARPAL TUNNEL RELEASE     x2  . CHOLECYSTECTOMY    . COLONOSCOPY    . SHOULDER ARTHROSCOPY WITH OPEN ROTATOR CUFF REPAIR Left 10/31/2015   Procedure: SHOULDER ARTHROSCOPY, SUBACROMIAL DECOMPRESSION, BICEPS TENOTOMY AND MINI OPEN ROTATOR CUFF REPAIR;  Surgeon: Juanell FairlyKevin Krasinski, MD;  Location: [facility] ORS;  Service: Orthopedics;  Laterality: Left;  . TONSILLECTOMY      There were no vitals filed for this visit.      Subjective Assessment - 09/05/16 0800    Subjective Pt reports she still notices her shoulder is stiff in the morning.  Pt is going to see her PCP about her L forearm today which has been swollen.       Limitations House hold activities;Other (comment);Lifting   Patient Stated Goals pain relief to  be able to care for self, hair care, use left UE for housework, lifting pots, pans   Currently in Pain? Yes   Pain Score 1    Pain Location Shoulder   Pain Descriptors / Indicators Aching   Pain Type Chronic pain   Pain Onset  More than a month ago   Multiple Pain Sites No       TREATMENT   Manual Therapy:  AAROM left shoulder flexion, abduction, IR, ER X 10 reps each through pain free range  Sitting pectoral, upper trapezius, and subscapularis STM superficial and deep techniques as pt very TTP and trigger points appreciated: goal improve ROM, decrease spasms. x10 mins   Therapeutic Exercise:  Seated scapular retractions x15 and again x15 standing at doorframe. Cues to relax shoulders.  Standing at doorframe BUE scaption x10  Seated upper trapezius stretch 3 x 20 seconds with cues for gentle stretch  In standing ball on wall moving ball counterclockwise and clockwise x20 each direction  In standing theraball against wall moving up, down, L, R x8 each direction with BUEs  Standing isometrics at doorway x10 each direction: F, Abd, IR, ER. IR and ER with towel roll between body and elbow.             PT Education - 09/05/16 0803    Education provided Yes   Education Details Exercise technique; STM techniques   Person(s) Educated Patient   Methods Explanation;Demonstration;Verbal cues;Tactile cues   Comprehension Verbalized understanding;Returned demonstration;Need further instruction             PT Long Term  Goals - 07/16/16 0912      PT LONG TERM GOAL #1   Title Patient will be independent in home exercise program to improve strength/mobility for better functional independence with ADLs b 09/10/2016.    Baseline limited knowledge of appropriate pain control strategies, exercise progression for strength and ROM   Status Revised     PT LONG TERM GOAL #2   Title Patient will improved Quick DASH score to 20% or less impairment demonstrating reduced self-reported upper extremity disability by 09/10/2016.    Baseline Quick dash 70%; current 07/16/16 40% impairment    Status Revised     PT LONG TERM GOAL #3   Title Patient will increase LUE shoulder AROM: sitting: Shoulder flexion >140 degrees,  abduction: >120 degrees,  for increased functional ROM with ADLs such as grooming, dressing etc by 09/10/2016   Baseline AROM left shoulder sitting: flexion 70, abduction 60, IR to side of hip, ER severe limitation with functional ROM; unable to raise to ear; current 07/16/16 flexion 0-115, abduction 100, ER/IR WFL   Status Revised     PT LONG TERM GOAL #4   Title Patient will report pain level max. of 2/10 with right UE use by 09/10/2016 to allow improved function with overhead activities/personal care   Baseline pain leve max. 7/10 with using right UE for daily activities; current max level 5/10   Status Revised               Plan - 09/05/16 0831    Clinical Impression Statement Pt presents today with trigger points in subscapularis, UT, and pectoralis musculature but pt reports decreased tension following STM to these regions.  She tolerated all strengthening and AAROM exercises this session and will benefit from continued skilled PT interventions for improved ROM, strength, and funtional use of LUE.     Rehab Potential Fair   PT Frequency 2x / week   PT Duration 8 weeks   PT Treatment/Interventions Iontophoresis 4mg /ml Dexamethasone;Electrical Stimulation;Cryotherapy;Moist Heat;Ultrasound;Patient/family education;Neuromuscular re-education;Therapeutic exercise;Manual techniques;Scar mobilization;Taping;Dry needling   PT Next Visit Plan modalities for pain control, muslce re education, ther. ex for motor control, strength, manual STM   PT Home Exercise Plan scapular control, scaption for movement, pain control with heat/TENS, isometric band exercises and scapular retraction band exercise      Patient will benefit from skilled therapeutic intervention in order to improve the following deficits and impairments:  Impaired UE functional use, Increased muscle spasms, Decreased activity tolerance, Decreased knowledge of precautions, Pain, Impaired perceived functional ability, Decreased  strength  Visit Diagnosis: Muscle weakness (generalized)  Stiffness of left shoulder, not elsewhere classified  Left shoulder pain, unspecified chronicity     Problem List Patient Active Problem List   Diagnosis Date Noted  . Pulmonary hypertension 08/16/2012  . POTS (postural orthostatic tachycardia syndrome) 08/16/2012  . Chest pain 06/26/2012  . Shortness of breath 06/26/2012  . Rapid heart beat 06/26/2012    Encarnacion Chu PT, DPT 09/05/2016, 8:41 AM  [place] [facility] PHYSICAL AND SPORTS MEDICINE [address] [place], [address] Phone: [phone]   Fax:  [phone]  Name: Claudia [name] MRN: [id number] Date of Birth: 27-Aug-1965

## 2016-09-09 ENCOUNTER — Encounter: Payer: Self-pay | Admitting: Physical Therapy

## 2016-09-09 ENCOUNTER — Ambulatory Visit: Payer: 59 | Admitting: Physical Therapy

## 2016-09-09 DIAGNOSIS — M25512 Pain in left shoulder: Secondary | ICD-10-CM

## 2016-09-09 DIAGNOSIS — M6281 Muscle weakness (generalized): Secondary | ICD-10-CM

## 2016-09-09 DIAGNOSIS — M25612 Stiffness of left shoulder, not elsewhere classified: Secondary | ICD-10-CM

## 2016-09-09 NOTE — Therapy (Signed)
[place] [facility] [address] [name], [name], [address] Phone: [phone]   Fax:  [phone]  Physical Therapy Treatment  Patient Details  Name: Claudia [name] MRN: [id number] Date of Birth: 12/13/1965 Referring Provider: Juanell FairlyKrasinski, Kevin, MD  Encounter Date: 09/09/2016      PT End of Session - 09/09/16 0933    Visit Number 29   Number of Visits 32   Date for PT Re-Evaluation 09/10/16   PT Start Time 0800   PT Stop Time 0913   PT Time Calculation (min) 73 min   Activity Tolerance Patient tolerated treatment well   Behavior During Therapy [facility] for tasks assessed/performed      Past Medical History:  Diagnosis Date  . Arthritis    knees, back;   . Asthma    H/O YEARS AGO-NO INHALERS  . Difficult intubation   . Dysrhythmia    [facility]  . Rotator cuff tear    left    Past Surgical History:  Procedure Laterality Date  . CARPAL TUNNEL RELEASE     x2  . CHOLECYSTECTOMY    . COLONOSCOPY    . SHOULDER ARTHROSCOPY WITH OPEN ROTATOR CUFF REPAIR Left 10/31/2015   Procedure: SHOULDER ARTHROSCOPY, SUBACROMIAL DECOMPRESSION, BICEPS TENOTOMY AND MINI OPEN ROTATOR CUFF REPAIR;  Surgeon: Juanell FairlyKevin Krasinski, MD;  Location: [facility];  Service: Orthopedics;  Laterality: Left;  . TONSILLECTOMY      There were no vitals filed for this visit.      Subjective Assessment - 09/09/16 0804    Subjective Patient reports she was seen by MD and was told her left elbow pain/swelling is from osteoarthritis and was placed on meloxicam and prednisone.   Limitations House hold activities;Other (comment);Lifting   Patient Stated Goals pain relief to  be able to care for self, hair care, use left UE for housework, lifting pots, pans   Currently in Pain? Yes   Pain Score 1    Pain Location Shoulder   Pain Orientation Left   Pain Descriptors / Indicators Discomfort   Pain Type Chronic pain   Pain Onset  More than a month ago   Pain Frequency Intermittent      Objective: AROM: pre treatment: left shoulder flexion 0-115 Palpation: + moderate spasms along left shoulder upper trapezius, anterior aspect of shoulder/pectoral muscles  Treatment: Therapeutic exercise: patient performed exercises with verbal, tactile cues demonstration of therapist: Supine lying: AAROM with 2# ball overhead x 15 reps with VC AAROM with assist of therapist to 145 degrees flexion stretch felt at end range, ER/ IR through full ROM without symptoms Standing at door frame 15 reps scapular retraction, 15 reps scaption holding (2) 1# weights Cable standing row 10# x 20 reps Ball on wall CW/CCW x 15 reps  Manual therapy: STM left upper trapezius, posterior aspect of left shoulder with patient seated, supine Joint mobilization GHJ lateral distraction grade 2-3 with caudal glide x 10 reps supine lying in conjunction with exercises  Modalities: Electrical stimulation:Russian stim. 10/10 cycle applied electrodes to medial border of scapula and lower trapezius muscles; high volt estim applied to anterior aspect of shoulder and upper trapezius muscle x 25min. With patient seated (at end of session) Moist heat x 20 min. To left shoulder with estim.; pain control; no adverse reactions noted  Patient response to treatment: Patient able to perform all exercises with mild discomfort, improved contraction with estim. As compared to previous sessions. Improved motor  control, technique with minimal VC/instruction. Improved AROM to 120 degrees and AAROM to 145 degrees with treatment.          PT Education - 09/09/16 0813    Education provided Yes   Education Details HEP: re assessed exercises, ROM stretching and modification of HEP   Person(s) Educated Patient   Methods Explanation;Demonstration;Verbal cues; handout   Comprehension Verbalized understanding;Returned demonstration; verbal cues             PT  Long Term Goals - 07/16/16 0912      PT LONG TERM GOAL #1   Title Patient will be independent in home exercise program to improve strength/mobility for better functional independence with ADLs b 09/10/2016.    Baseline limited knowledge of appropriate pain control strategies, exercise progression for strength and ROM   Status Revised     PT LONG TERM GOAL #2   Title Patient will improved Quick DASH score to 20% or less impairment demonstrating reduced self-reported upper extremity disability by 09/10/2016.    Baseline Quick dash 70%; current 07/16/16 40% impairment    Status Revised     PT LONG TERM GOAL #3   Title Patient will increase LUE shoulder AROM: sitting: Shoulder flexion >140 degrees, abduction: >120 degrees,  for increased functional ROM with ADLs such as grooming, dressing etc by 09/10/2016   Baseline AROM left shoulder sitting: flexion 70, abduction 60, IR to side of hip, ER severe limitation with functional ROM; unable to raise to ear; current 07/16/16 flexion 0-115, abduction 100, ER/IR WFL   Status Revised     PT LONG TERM GOAL #4   Title Patient will report pain level max. of 2/10 with right UE use by 09/10/2016 to allow improved function with overhead activities/personal care   Baseline pain leve max. 7/10 with using right UE for daily activities; current max level 5/10   Status Revised               Plan - 09/09/16 0934    Clinical Impression Statement Patient responded well to treatment with improved AROM to 120 and AAROM supine to 145 degrees following STM and contributing factor of being on anit inflammatory medication. She is aware of precautions to avoid aggravating shoulder while on medication and has a modified home program to allow strength, stabilization and ROM exercises to progress and continue to improve left shoulder function without exacerbation of symptoms.    Rehab Potential Fair   PT Frequency 2x / week   PT Duration 8 weeks   PT  Treatment/Interventions Iontophoresis 4mg /ml Dexamethasone;Electrical Stimulation;Cryotherapy;Moist Heat;Ultrasound;Patient/family education;Neuromuscular re-education;Therapeutic exercise;Manual techniques;Scar mobilization;Taping;Dry needling   PT Next Visit Plan modalities for pain control, muslce re education, ther. ex for motor control, strength, manual STM   PT Home Exercise Plan scapular control, scaption for movement, pain control with heat/TENS, isometric band exercises and scapular retraction band exercise      Patient will benefit from skilled therapeutic intervention in order to improve the following deficits and impairments:  Impaired UE functional use, Increased muscle spasms, Decreased activity tolerance, Decreased knowledge of precautions, Pain, Impaired perceived functional ability, Decreased strength  Visit Diagnosis: Muscle weakness (generalized)  Stiffness of left shoulder, not elsewhere classified  Left shoulder pain, unspecified chronicity     Problem List Patient Active Problem List   Diagnosis Date Noted  . Pulmonary hypertension 08/16/2012  . POTS (postural orthostatic tachycardia syndrome) 08/16/2012  . Chest pain 06/26/2012  . Shortness of breath 06/26/2012  . Rapid heart beat  06/26/2012    Beacher May PT 09/09/2016, 9:55 AM  [facility] PHYSICAL AND SPORTS MEDICINE [address], [place], [address] Phone: [phone]   Fax:  [phone]  Name: SHAILYN WEYANDT MRN: [id number] Date of Birth: 1965/12/31

## 2016-09-13 ENCOUNTER — Ambulatory Visit: Payer: 59 | Admitting: Physical Therapy

## 2016-09-16 ENCOUNTER — Encounter: Payer: Self-pay | Admitting: Physical Therapy

## 2016-09-16 ENCOUNTER — Ambulatory Visit: Payer: 59 | Admitting: Physical Therapy

## 2016-09-16 DIAGNOSIS — M25612 Stiffness of left shoulder, not elsewhere classified: Secondary | ICD-10-CM

## 2016-09-16 DIAGNOSIS — M25512 Pain in left shoulder: Secondary | ICD-10-CM

## 2016-09-16 DIAGNOSIS — M6281 Muscle weakness (generalized): Secondary | ICD-10-CM | POA: Diagnosis not present

## 2016-09-16 NOTE — Therapy (Signed)
[facility] PHYS[facility] AND [facility] MEDICINE [address] [place], [name], [address] Phone: [phone]   Fax:  [phone]  Physical Therapy Treatment  Patient Details  Name: Claudia [name] MRN: [id number] Date of Birth: December 20, 1965 Referring Provider: Juanell FairlyKrasinski, Kevin, MD  Encounter Date: 09/16/2016      PT End of Session - 09/16/16 1412    Visit Number 30   Number of Visits 44   Date for PT Re-Evaluation 10/28/16   PT Start Time 0754   PT Stop Time 0900   PT Time Calculation (min) 66 min   Activity Tolerance Patient tolerated treatment well;Patient limited by pain   Behavior [facility] Therapy [facility] for tasks assessed/performed      Past Medical History:  Diagnosis Date  . Arthritis    knees, back;   . Asthma    H/O YEARS AGO-NO INHALERS  . Difficult intubation   . Dysrhythmia    [facility]  . Rotator cuff tear    left    Past Surgical History:  Procedure Laterality Date  . CARPAL TUNNEL RELEASE     x2  . CHOLECYSTECTOMY    . COLONOSCOPY    . SHOULDER ARTHROSCOPY WITH OPEN ROTATOR CUFF REPAIR Left 10/31/2015   Procedure: SHOULDER ARTHROSCOPY, SUBACROMIAL DECOMPRESSION, BICEPS TENOTOMY AND MINI OPEN ROTATOR CUFF REPAIR;  Surgeon: Juanell FairlyKevin Krasinski, MD;  Location: [facility] ORS;  Service: Orthopedics;  Laterality: Left;  . TONSILLECTOMY      There were no vitals filed for this visit.      Subjective Assessment - 09/16/16 0757    Subjective Patient reports she finished prednisone taper and is having headaches intermittently which is relieved with meloxicam. Patient reports she continues with stiffness in left shoulder. She reports having spasms in front of left shoulder/pectoral region that began Saturday after finishing her prednisone taper.    Limitations House hold activities;Other (comment);Lifting   Patient Stated Goals pain relief to  be able to care for self, hair care, use left UE for housework, lifting  pots, pans   Currently in Pain? Yes   Pain Score 1   after taking medication and rubbing lotion on shoulder   Pain Location Shoulder   Pain Orientation Left   Pain Descriptors / Indicators Tightness;Discomfort   Pain Type Chronic pain   Pain Onset More than a month ago   Pain Frequency Intermittent      Objective: AROM: pre treatment 0-105/AAROM 110 with effort Palpation: + moderate spasms along left shoulder upper trapezius, anterior aspect of shoulder/pectoral muscles Outcome measure: QuickDash 40% impairment (0 = no self perceived disability)  Treatment: Therapeutic exercise: patient performed exercises with verbal, tactile cues demonstration of therapist: Supine lying: AAROM with 3# dumbell overhead x 15 reps with VC AAROM with assist of therapist to 140 degrees flexion stretch felt at end range, ER/ IR through full ROM with increased stiffness at end range ER Standing at door frame 10 reps scapular retraction At door frame: AAROM/stretch forward elevation multiple reps, sets; difficulty performing due to limited ER Cable standing row boht UE's 15# x 25 reps Single arm row 10# right UE, 7# left UE x 15 reps each Single arm row with double red tubing x 10 reps high and low row with VC and demonstration  Manual therapy: STM left upper trapezius, anterior aspect of left shoulder with patient supine Joint mobilization GHJ lateral distraction grade 2-3 with caudal glide x 10 reps supine lying in conjunction with exercises  Modalities: Moist heat x . To left shoulder at end of session, seated in chair.; pain control; no adverse reactions noted  Patient response to treatment: patient demonstrated improved technique with exercises with minimal VC for correct alignment and remained limited in ER which interfered with standing forward elevation exercises. Patient with increased soreness and pain with ER of left shoulder during exercises which limited session. Improved  forward elevation in supine to 140 degrees with mild stiffness at end range. Improved ability to perform exercises with less discomfort following STM left shoulder          PT Education - 09/16/16 0810    Education provided Yes   Education Details HEP: work on stretches for ER and forward elevation in supine and standing at door frame and single /double arm rows high and low   Person(s) Educated Patient   Methods Explanation;Demonstration;Tactile cues;Verbal cues   Comprehension Verbalized understanding;Returned demonstration;Verbal cues required;Need further instruction             PT Long Term Goals - 09/16/16 0901      PT LONG TERM GOAL #1   Title Patient will be independent in home exercise program to improve strength/mobility for better functional independence with ADLs b 10/28/2016.    Baseline limited knowledge of appropriate pain control strategies, exercise progression for strength and ROM   Status Revised     PT LONG TERM GOAL #2   Title Patient will improved Quick DASH score to 20% or less impairment demonstrating reduced self-reported upper extremity disability by 10/28/2016.    Baseline Quick dash 70%; current 07/16/16 40% impairment; 09/16/16 40% impairment   Status Revised     PT LONG TERM GOAL #3   Title Patient will increase LUE shoulder AROM: sitting: Shoulder flexion >140 degrees, abduction: >120 degrees,  for increased functional ROM with ADLs such as grooming, dressing etc by 10/28/2016   Baseline 09/16/16 AROM forward elevation 110, limited AROM ER and IR 30% with pain   Status Revised     PT LONG TERM GOAL #4   Title Patient will report pain level max. of <2/10 with right UE use by 10/28/2016 to allow improved function with overhead activities/personal care   Baseline pain leve max. 7/10 with using right UE for daily activities; current max level 5/10   Status Revised               Plan - 09/16/16 0900    Clinical Impression Statement Patient is  progressing with ROM and strength and continues with limitations with active forward elevation to 110 and limited ER with pain. She is responding slowly to physical therapy due to chronic condition and rotator cuff tear. She should continue to improve with additional physicl therapy intervention.    Rehab Potential Fair   Clinical Impairments Affecting Rehab Potential (+) age, motivated (-)chronic pain right shoulder even following surgery 10/2015   PT Frequency 2x / week   PT Duration 6 weeks   PT Next Visit Plan modalities for pain control, muslce re education, ther. ex for motor control, strength, manual STM   PT Home Exercise Plan scapular control, scaption for movement, pain control with heat/TENS, isometric band exercises and scapular retraction band exercise   Consulted and Agree with Plan of Care Patient      Patient will benefit from skilled therapeutic intervention in order to improve the following deficits and impairments:  Impaired UE functional use, Increased muscle spasms, Decreased activity tolerance, Decreased knowledge of precautions, Pain, Impaired  perceived functional ability, Decreased strength  Visit Diagnosis: Stiffness of left shoulder, not elsewhere classified - Plan: PT plan of care cert/re-cert  Muscle weakness (generalized) - Plan: PT plan of care cert/re-cert  Left shoulder pain, unspecified chronicity - Plan: PT plan of care cert/re-cert     Problem List Patient Active Problem List   Diagnosis Date Noted  . Pulmonary hypertension 08/16/2012  . POTS (postural orthostatic tachycardia syndrome) 08/16/2012  . Chest pain 06/26/2012  . Shortness of breath 06/26/2012  . Rapid heart beat 06/26/2012    Beacher May PT 09/16/2016, 2:33 PM  [place] [facility] PHYSICAL AND SPORTS MEDICINE [address] [place], [address] Phone: [phone]   Fax:  [phone]  Name: Claudia [name] MRN: [id number] Date of Birth:  11-25-1965

## 2016-09-18 ENCOUNTER — Ambulatory Visit: Payer: 59 | Admitting: Physical Therapy

## 2016-09-18 ENCOUNTER — Encounter: Payer: Self-pay | Admitting: Physical Therapy

## 2016-09-18 DIAGNOSIS — M25512 Pain in left shoulder: Secondary | ICD-10-CM

## 2016-09-18 DIAGNOSIS — M6281 Muscle weakness (generalized): Secondary | ICD-10-CM

## 2016-09-18 DIAGNOSIS — M25612 Stiffness of left shoulder, not elsewhere classified: Secondary | ICD-10-CM

## 2016-09-18 NOTE — Therapy (Signed)
[place] [facility]ORTS MEDICINE [address] [place], [address] Phone: [phone]   Fax:  [phone]  Physical Therapy Treatment  Patient Details  Name: Claudia [name] MRN: [id number] Date of Birth: May 05, 1966 Referring Provider: Juanell Fairly, MD  Encounter Date: 09/18/2016      PT End of Session - 09/18/16 0804    Visit Number 31   Number of Visits 44   Date for PT Re-Evaluation 10/28/16   PT Start Time 0800   PT Stop Time 0850   PT Time Calculation (min) 50 min   Activity Tolerance Patient tolerated treatment well;Patient limited by pain   Behavior During Therapy [facility] for tasks assessed/performed      Past Medical History:  Diagnosis Date  . Arthritis    knees, back;   . Asthma    H/O YEARS AGO-NO INHALERS  . Difficult intubation   . Dysrhythmia    H/O [facility] PROBLEMS SINCE 2013  . Rotator cuff tear    left    Past Surgical History:  Procedure Laterality Date  . CARPAL TUNNEL RELEASE     x2  . CHOLECYSTECTOMY    . COLONOSCOPY    . SHOULDER ARTHROSCOPY WITH OPEN ROTATOR CUFF REPAIR Left 10/31/2015   Procedure: SHOULDER ARTHROSCOPY, SUBACROMIAL DECOMPRESSION, BICEPS TENOTOMY AND MINI OPEN ROTATOR CUFF REPAIR;  Surgeon: Juanell Fairly, MD;  Location: [facility] ORS;  Service: Orthopedics;  Laterality: Left;  . TONSILLECTOMY      There were no vitals filed for this visit.      Subjective Assessment - 09/18/16 0849    Subjective Patient reports she was sore following previous session and is better today. she continues with pain in upper arm and elbow with forward elevation of shoulder sitting, lying down or standing. She is using pulleys, stretching and performing home exercises as instructed.    Limitations House hold activities;Other (comment);Lifting   Patient Stated Goals pain relief to  be able to care for self, hair care, use left UE for housework, lifting pots, pans   Currently in Pain? Yes    Pain Score 1   after taking medication and rubbing lotion on shoulder   Pain Location Shoulder   Pain Orientation Left   Pain Descriptors / Indicators Tightness;Discomfort   Pain Type Chronic pain   Pain Onset More than a month ago   Pain Frequency Intermittent       Objective: AROM: pre treatment 0-110 AAROM: supine lying 0-140 Palpation: + moderate spasms along left shoulder upper trapezius, anterior aspect of shoulder/pectoral muscles  Treatment: Therapeutic exercise: patient performed exercises with verbal, tactile cues demonstration of therapist: Supine lying: AAROM with 3# dumbell overhead x 10 reps with VC AAROM with assist of therapist to 140degrees flexion stretch feltat end range, ER/ IR through full ROM with increased stiffness at end range ER Active circles with 2# weight in hand with patient holding UE at 90 degrees 2 sets 10 reps each direction Standing at door frame 10 reps scapular retraction At door frame: AAROM/stretch forward elevation multiple reps, sets; difficulty performing due to limited ER Side lying: ER with towel between arm and trunk x 15 reps Abduction with assistance x 10 reps Scapular adduction with tactile cues x 10 reps  Manual therapy: STM left upper trapezius, anterior aspect of left shoulder, pectoral muscles and MFR pectoral muscles with patient supine and sitting Joint mobilization GHJ lateral distraction grade 2-3 with caudal glide x 10 reps supine lying in conjunction  with exercises  Modalities: Moist heat x 10min. To left shoulder at end of session, seated in chair.; pain control; no adverse reactions noted  Patient response to treatment: Patient unable to perform any forward flexion in supine or sitting without increased upper arm/elbow pain. She demonstrated decreased spasms in pectoral muscles and upper trapezius muscles following STM. Improved ER and abduction with assistance in side lying. Remains limited in ER in standing with  AAROM on wall. Decreased soreness reported following moist heat.         PT Education - 09/18/16 [phone]    Education provided Yes   Education Details HEP: side lying ER, abduction, supine stabilization with 2# weight circles, stretching/ROM with pulleys, on wall, lying; scapular retraction with resistive bands   Person(s) Educated Patient   Methods Explanation;Demonstration;Verbal cues   Comprehension Verbalized understanding;Returned demonstration;Verbal cues required             PT Long Term Goals - 09/16/16 0901      PT LONG TERM GOAL #1   Title Patient will be independent in home exercise program to improve strength/mobility for better functional independence with ADLs b 10/28/2016.    Baseline limited knowledge of appropriate pain control strategies, exercise progression for strength and ROM   Status Revised     PT LONG TERM GOAL #2   Title Patient will improved Quick DASH score to 20% or less impairment demonstrating reduced self-reported upper extremity disability by 10/28/2016.    Baseline Quick dash 70%; current 07/16/16 40% impairment; 09/16/16 40% impairment   Status Revised     PT LONG TERM GOAL #3   Title Patient will increase LUE shoulder AROM: sitting: Shoulder flexion >140 degrees, abduction: >120 degrees,  for increased functional ROM with ADLs such as grooming, dressing etc by 10/28/2016   Baseline 09/16/16 AROM forward elevation 110, limited AROM ER and IR 30% with pain   Status Revised     PT LONG TERM GOAL #4   Title Patient will report pain level max. of <2/10 with right UE use by 10/28/2016 to allow improved function with overhead activities/personal care   Baseline pain leve max. 7/10 with using right UE for daily activities; current max level 5/10   Status Revised               Plan - 09/18/16 0900    Clinical Impression Statement Patient is progressing slowly due to continued pain with forward elevation and weakness in left shoulder and periscapular  muscles. She has spasms in pectoral region due to positioning of shoulder and weakness/pain in shoulder girdle. She will benefit from additional physical therapy intervention to address spasms/pain and strength deficits.    Rehab Potential Fair   PT Frequency 2x / week   PT Duration 6 weeks   PT Treatment/Interventions Iontophoresis 4mg /ml Dexamethasone;Electrical Stimulation;Cryotherapy;Moist Heat;Ultrasound;Patient/family education;Neuromuscular re-education;Therapeutic exercise;Manual techniques;Scar mobilization;Taping;Dry needling   PT Next Visit Plan modalities for pain control, muslce re education, ther. ex for motor control, strength, manual STM   PT Home Exercise Plan scapular control, scaption for movement, pain control with heat/TENS, isometric band exercises and scapular retraction band exercise      Patient will benefit from skilled therapeutic intervention in order to improve the following deficits and impairments:  Impaired UE functional use, Increased muscle spasms, Decreased activity tolerance, Decreased knowledge of precautions, Pain, Impaired perceived functional ability, Decreased strength  Visit Diagnosis: Stiffness of left shoulder, not elsewhere classified  Muscle weakness (generalized)  Left shoulder pain, unspecified chronicity  Problem List Patient Active Problem List   Diagnosis Date Noted  . Pulmonary hypertension 08/16/2012  . POTS (postural orthostatic tachycardia syndrome) 08/16/2012  . Chest pain 06/26/2012  . Shortness of breath 06/26/2012  . Rapid heart beat 06/26/2012    Beacher May PT 09/19/2016, 9:50 AM   [facility] PHYSICAL AND SPORTS MEDICINE [address] [place], [address] Phone: [phone]   Fax:  [phone]  Name: Claudia [name] MRN: [id number] Date of Birth: Feb 11, 1966

## 2016-09-19 DIAGNOSIS — M75112 Incomplete rotator cuff tear or rupture of left shoulder, not specified as traumatic: Secondary | ICD-10-CM | POA: Diagnosis not present

## 2016-09-19 DIAGNOSIS — M25612 Stiffness of left shoulder, not elsewhere classified: Secondary | ICD-10-CM | POA: Diagnosis not present

## 2016-09-23 ENCOUNTER — Ambulatory Visit: Payer: 59 | Admitting: Physical Therapy

## 2016-09-23 ENCOUNTER — Encounter: Payer: Self-pay | Admitting: Physical Therapy

## 2016-09-23 DIAGNOSIS — M25612 Stiffness of left shoulder, not elsewhere classified: Secondary | ICD-10-CM | POA: Diagnosis not present

## 2016-09-23 DIAGNOSIS — M6281 Muscle weakness (generalized): Secondary | ICD-10-CM

## 2016-09-23 DIAGNOSIS — M25512 Pain in left shoulder: Secondary | ICD-10-CM

## 2016-09-23 NOTE — Therapy (Signed)
[place] [facility] PHYSICAL [facility] SPORTS MEDICINE [address] [place], [address] Phone: [phone]   Fax:  [phone]  Physical Therapy Treatment  Patient Details  Name: Claudia [name] MRN: [id number] Date of Birth: Jun 30, 1966 Referring Provider: Juanell Fairly, MD  Encounter Date: 09/23/2016      PT End of Session - 09/23/16 0801    Visit Number 32   Number of Visits 44   Date for PT Re-Evaluation 10/28/16   PT Start Time 0757   PT Stop Time 0853   PT Time Calculation (min) 56 min   Activity Tolerance Patient tolerated treatment well;Patient limited by pain   Behavior During Therapy [facility] for tasks assessed/performed      Past Medical History:  Diagnosis Date  . Arthritis    knees, back;   . Asthma    H/O YEARS AGO-NO INHALERS  . Difficult intubation   . Dysrhythmia    H/O [facility] PROBLEMS SINCE 2013  . Rotator cuff tear    left    Past Surgical History:  Procedure Laterality Date  . CARPAL TUNNEL RELEASE     x2  . CHOLECYSTECTOMY    . COLONOSCOPY    . SHOULDER ARTHROSCOPY WITH OPEN ROTATOR CUFF REPAIR Left 10/31/2015   Procedure: SHOULDER ARTHROSCOPY, SUBACROMIAL DECOMPRESSION, BICEPS TENOTOMY AND MINI OPEN ROTATOR CUFF REPAIR;  Surgeon: Juanell Fairly, MD;  Location: [facility] ORS;  Service: Orthopedics;  Laterality: Left;  . TONSILLECTOMY      There were no vitals filed for this visit.      Subjective Assessment - 09/23/16 0758    Subjective Paitent reports she was seen by Dr. Martha Clan and is referred back for 8 more weeks to continue with physical therapy for pain, ROM, strength.    Limitations House hold activities;Other (comment);Lifting   Patient Stated Goals pain relief to  be able to care for self, hair care, use left UE for housework, lifting pots, pans   Currently in Pain? Yes   Pain Score 1    Pain Location Shoulder   Pain Orientation Left   Pain Descriptors / Indicators Tightness;Discomfort   Pain  Onset More than a month ago   Pain Frequency Intermittent      Objective: AROM: pre treatment 0-105 AAROM: supine lying 0-150 Palpation: + moderate spasms along left shoulder anterior aspect of shoulder/pectoral muscles  Treatment: Therapeutic exercise: patient performed exercises with verbal, tactile cues demonstration of therapist: Supine lying: AAROM with 3# dumbelloverhead  2 x 10 reps with VC and guided ROM AAROM with assist of therapist to 150degrees flexion stretch feltat end range, ER/ IR through full ROM with increased stiffness at end range ER Active circles with 2# weight in hand with patient holding UE at 90 degrees 3 sets 10 reps each direction Standing at door frame 15reps scapular retraction At wall scapular retraction with green resistive tubing x 20 reps with VC/assistance scaption at wall x 15 reps with guidance for correct technique Side lying: ER with towel between arm and trunk  2# weight 2 x 15 reps Abduction with assistance 2 x 10 reps Standing: Ball on table straight arm press 2 x 10 Higher table height with ball press down x 15 At wall flexion isometrics press into ball with staggered stance 2 x 10 each UE  Manual therapy: STM left upper  anterior aspectof left shoulder, pectoral muscles and MFR pectoral muscles with patient supine and sitting Joint mobilization GHJ lateral distraction grade 2-3 with caudal glide  x 10 reps supine lying in conjunction with exercises  Modalities: Moist heat x 10min. To left shoulder at end of session, seated in chair.; pain control; no adverse reactions noted  Patient response to treatment: Patient improved motor control with VC, repetition, assistance with exercise. She improved ROM into forward elevation and ER in supine following STM. Decreased soreness following moist heat at end of session       PT Education - 09/23/16 0800    Education provided Yes   Education Details HEP: re assessed exercises for ER,  abduciton, supine exercises and stretches   Person(s) Educated Patient   Methods Explanation;Demonstration;Verbal cues   Comprehension Verbalized understanding;Returned demonstration;Verbal cues required             PT Long Term Goals - 09/16/16 0901      PT LONG TERM GOAL #1   Title Patient will be independent in home exercise program to improve strength/mobility for better functional independence with ADLs b 10/28/2016.    Baseline limited knowledge of appropriate pain control strategies, exercise progression for strength and ROM   Status Revised     PT LONG TERM GOAL #2   Title Patient will improved Quick DASH score to 20% or less impairment demonstrating reduced self-reported upper extremity disability by 10/28/2016.    Baseline Quick dash 70%; current 07/16/16 40% impairment; 09/16/16 40% impairment   Status Revised     PT LONG TERM GOAL #3   Title Patient will increase LUE shoulder AROM: sitting: Shoulder flexion >140 degrees, abduction: >120 degrees,  for increased functional ROM with ADLs such as grooming, dressing etc by 10/28/2016   Baseline 09/16/16 AROM forward elevation 110, limited AROM ER and IR 30% with pain   Status Revised     PT LONG TERM GOAL #4   Title Patient will report pain level max. of <2/10 with right UE use by 10/28/2016 to allow improved function with overhead activities/personal care   Baseline pain leve max. 7/10 with using right UE for daily activities; current max level 5/10   Status Revised               Plan - 09/23/16 0802    Clinical Impression Statement Patient continues to respond well with exercises and towards goals. She continues with weakness in left UE and spasms in pectoral muscles that limit ROM in left shoulder. She continues to benefit from physical therapy intervention to address limitations and improve functional use left UE.    Rehab Potential Fair   PT Frequency 2x / week   PT Duration 6 weeks   PT Treatment/Interventions  Iontophoresis 4mg /ml Dexamethasone;Electrical Stimulation;Cryotherapy;Moist Heat;Ultrasound;Patient/family education;Neuromuscular re-education;Therapeutic exercise;Manual techniques;Scar mobilization;Taping;Dry needling   PT Next Visit Plan modalities for pain control, muslce re education, ther. ex for motor control, strength, manual STM   PT Home Exercise Plan scapular control, scaption for movement, pain control with heat/TENS, isometric band exercises and scapular retraction band exercise      Patient will benefit from skilled therapeutic intervention in order to improve the following deficits and impairments:  Impaired UE functional use, Increased muscle spasms, Decreased activity tolerance, Decreased knowledge of precautions, Pain, Impaired perceived functional ability, Decreased strength  Visit Diagnosis: Stiffness of left shoulder, not elsewhere classified  Muscle weakness (generalized)  Left shoulder pain, unspecified chronicity     Problem List Patient Active Problem List   Diagnosis Date Noted  . Pulmonary hypertension 08/16/2012  . POTS (postural orthostatic tachycardia syndrome) 08/16/2012  . Chest pain 06/26/2012  .  Shortness of breath 06/26/2012  . Rapid heart beat 06/26/2012    Beacher May PT 09/23/2016, 8:57 AM  [place] [facility] PHYSICAL AND SPORTS MEDICINE [address], [place], [address] Phone: [phone]   Fax:  [phone]  Name: Claudia [name] MRN: [id number] Date of Birth: 12/22/1965

## 2016-09-25 ENCOUNTER — Encounter: Payer: Self-pay | Admitting: Physical Therapy

## 2016-09-25 ENCOUNTER — Ambulatory Visit: Payer: 59 | Admitting: Physical Therapy

## 2016-09-25 DIAGNOSIS — M6281 Muscle weakness (generalized): Secondary | ICD-10-CM

## 2016-09-25 DIAGNOSIS — M25612 Stiffness of left shoulder, not elsewhere classified: Secondary | ICD-10-CM | POA: Diagnosis not present

## 2016-09-25 DIAGNOSIS — M25512 Pain in left shoulder: Secondary | ICD-10-CM

## 2016-09-25 NOTE — Therapy (Signed)
[facility] [address] [place], [address] Phone: [phone]   Fax:  [phone]  Physical Therapy Treatment  Patient Details  Name: Claudia [name] MRN: [id number] Date of Birth: [birth year] Referring Provider: Juanell Fairly, MD  Encounter Date: 09/25/2016      PT End of Session - 09/25/16 0811    Visit Number 33   Number of Visits 44   Date for PT Re-Evaluation 10/28/16   PT Start Time 0801   PT Stop Time 0900   PT Time Calculation (min) 59 min   Activity Tolerance Patient tolerated treatment well;Patient limited by pain   Behavior During Therapy [facility] for tasks assessed/performed      Past Medical History:  Diagnosis Date  . Arthritis    knees, back;   . Asthma    H/O YEARS AGO-NO INHALERS  . Difficult intubation   . Dysrhythmia    H/O [facility] PROBLEMS SINCE 2013  . Rotator cuff tear    left    Past Surgical History:  Procedure Laterality Date  . CARPAL TUNNEL RELEASE     x2  . CHOLECYSTECTOMY    . COLONOSCOPY    . SHOULDER ARTHROSCOPY WITH OPEN ROTATOR CUFF REPAIR Left 10/31/2015   Procedure: SHOULDER ARTHROSCOPY, SUBACROMIAL DECOMPRESSION, BICEPS TENOTOMY AND MINI OPEN ROTATOR CUFF REPAIR;  Surgeon: Juanell Fairly, MD;  Location: [facility] ORS;  Service: Orthopedics;  Laterality: Left;  . TONSILLECTOMY      There were no vitals filed for this visit.      Subjective Assessment - 09/25/16 0804    Subjective Patient reports she has taken meloxicam this morning and feels most of her problem is in the front of her left shoulder/pectoral region. She reports she has less tenderness/soreness following previous session.    Limitations House hold activities;Other (comment);Lifting   Patient Stated Goals pain relief to  be able to care for self, hair care, use left UE for housework, lifting pots, pans   Currently in Pain? Yes   Pain Score 2    Pain Location Shoulder   Pain Orientation  Left   Pain Descriptors / Indicators Tightness;Discomfort   Pain Type Chronic pain   Pain Onset More than a month ago   Pain Frequency Intermittent        Objective: AROM: pre treatment 0-115 AAROM: supine lying 0-130 Palpation: + moderate spasms along left shoulder anterior aspect of shoulder/pectoral muscles  Treatment: Therapeutic exercise: patient performed exercises with verbal, tactile cues demonstration of therapist: Supine lying: AAROM with 3# dumbelloverhead  2 x 10reps with VC and guided ROM AAROM with assist of therapist to 140degrees flexion stretch feltat end range, ER/ IR through full ROM with increased stiffness at end range ER Rhythmic stabilization UE at 90 degrees 3 sets 10 reps each direction Side lying: ER with towel between arm and trunk  2# weight x 15 reps Abduction with assistance  x 10 reps Standing: Ball on table straight arm press 2 x 10 Higher table height with ball press down x 15 At wall flexion isometrics press into ball with staggered stance 2 x 10 each UE Standing at door frame 15reps scapular retraction Cable row 7# left 20 reps, right 12#  X 15 reps scaption at wall with ball x 15 reps with guidance for correct technique  Manual therapy: STM left upper  anterior aspectof left shoulder, pectoral muscles and MFR pectoral muscleswith patient supine and sitting Joint mobilization GHJ lateral distraction  grade 2-3 with caudal glide x 10 reps supine lying in conjunction with exercises  Modalities: Moist heat applied to left shoulder at end of session with patient seated x 10 min.: no adverse skin reactions noted   Patient response to treatment: Patient with improved technique and flexibility with repetition of supine ROM exercises. Required verbal cues and repetition for improved motor control with standing and resistive exercises. moderate fatigue at end of session.          PT Education - 09/25/16 0809    Education provided Yes    Education Details HEP: re assessed exercises for ER, abduction and isometric exercises with ball   Person(s) Educated Patient   Methods Explanation;Demonstration;Verbal cues   Comprehension Verbalized understanding;Returned demonstration;Verbal cues required;Need further instruction             PT Long Term Goals - 09/16/16 0901      PT LONG TERM GOAL #1   Title Patient will be independent in home exercise program to improve strength/mobility for better functional independence with ADLs b 10/28/2016.    Baseline limited knowledge of appropriate pain control strategies, exercise progression for strength and ROM   Status Revised     PT LONG TERM GOAL #2   Title Patient will improved Quick DASH score to 20% or less impairment demonstrating reduced self-reported upper extremity disability by 10/28/2016.    Baseline Quick dash 70%; current 07/16/16 40% impairment; 09/16/16 40% impairment   Status Revised     PT LONG TERM GOAL #3   Title Patient will increase LUE shoulder AROM: sitting: Shoulder flexion >140 degrees, abduction: >120 degrees,  for increased functional ROM with ADLs such as grooming, dressing etc by 10/28/2016   Baseline 09/16/16 AROM forward elevation 110, limited AROM ER and IR 30% with pain   Status Revised     PT LONG TERM GOAL #4   Title Patient will report pain level max. of <2/10 with right UE use by 10/28/2016 to allow improved function with overhead activities/personal care   Baseline pain leve max. 7/10 with using right UE for daily activities; current max level 5/10   Status Revised               Plan - 09/25/16 0908    Clinical Impression Statement Patient demonstrates improving strenth with increasing AROM in siting from 105 to 115 today. She continues with stiffness and pain that is being controlled with medication, exercise, rest as needed. She continues to require cuing, guidance to perform exercises to improve strength, ROM and will benefit from continued  physical therapy intervention to achieve maximal functional use left UE.    PT Frequency 2x / week   PT Duration 6 weeks   PT Treatment/Interventions Iontophoresis 4mg /ml Dexamethasone;Electrical Stimulation;Cryotherapy;Moist Heat;Ultrasound;Patient/family education;Neuromuscular re-education;Therapeutic exercise;Manual techniques;Scar mobilization;Taping;Dry needling   PT Next Visit Plan modalities for pain control, muslce re education, ther. ex for motor control, strength, manual STM   PT Home Exercise Plan scapular control, scaption for movement, pain control with heat/TENS, isometric band exercises and scapular retraction band exercise      Patient will benefit from skilled therapeutic intervention in order to improve the following deficits and impairments:  Impaired UE functional use, Increased muscle spasms, Decreased activity tolerance, Decreased knowledge of precautions, Pain, Impaired perceived functional ability, Decreased strength  Visit Diagnosis: Stiffness of left shoulder, not elsewhere classified  Muscle weakness (generalized)  Left shoulder pain, unspecified chronicity     Problem List Patient Active Problem List  Diagnosis Date Noted  . Pulmonary hypertension 08/16/2012  . POTS (postural orthostatic tachycardia syndrome) 08/16/2012  . Chest pain 06/26/2012  . Shortness of breath 06/26/2012  . Rapid heart beat 06/26/2012    Beacher MayBrooks, Dashawna Delbridge PT 09/25/2016, 9:12 AM  [facility] [address] [place], [name], [address] Phone: [phone]   Fax:  [phone]  Name: Claudia [name] MRN: [id number] Date of Birth: 07-20-1966

## 2016-09-30 ENCOUNTER — Encounter: Payer: Self-pay | Admitting: Physical Therapy

## 2016-09-30 ENCOUNTER — Ambulatory Visit: Payer: 59 | Attending: Orthopedic Surgery | Admitting: Physical Therapy

## 2016-09-30 DIAGNOSIS — M25612 Stiffness of left shoulder, not elsewhere classified: Secondary | ICD-10-CM

## 2016-09-30 DIAGNOSIS — M6281 Muscle weakness (generalized): Secondary | ICD-10-CM | POA: Diagnosis not present

## 2016-09-30 DIAGNOSIS — M25512 Pain in left shoulder: Secondary | ICD-10-CM | POA: Diagnosis not present

## 2016-09-30 DIAGNOSIS — M62838 Other muscle spasm: Secondary | ICD-10-CM | POA: Insufficient documentation

## 2016-09-30 NOTE — Therapy (Signed)
[facility] [address] [place], [address] Phone: [phone]   Fax:  [phone]  Physical Therapy Treatment  Patient Details  Name: Claudia [name] MRN: [id number] Date of Birth: 1965/10/14 Referring Provider: Juanell Fairly, MD  Encounter Date: 09/30/2016      PT End of Session - 09/30/16 0850    Visit Number 34   Number of Visits 44   Date for PT Re-Evaluation 10/28/16   PT Start Time 0846   PT Stop Time 0934   PT Time Calculation (min) 48 min   Activity Tolerance Patient tolerated treatment well;Patient limited by pain   Behavior During Therapy [facility] for tasks assessed/performed      Past Medical History:  Diagnosis Date  . Arthritis    knees, back;   . Asthma    H/O YEARS AGO-NO INHALERS  . Difficult intubation   . Dysrhythmia    H/O [facility] PROBLEMS SINCE 2013  . Rotator cuff tear    left    Past Surgical History:  Procedure Laterality Date  . CARPAL TUNNEL RELEASE     x2  . CHOLECYSTECTOMY    . COLONOSCOPY    . SHOULDER ARTHROSCOPY WITH OPEN ROTATOR CUFF REPAIR Left 10/31/2015   Procedure: SHOULDER ARTHROSCOPY, SUBACROMIAL DECOMPRESSION, BICEPS TENOTOMY AND MINI OPEN ROTATOR CUFF REPAIR;  Surgeon: Juanell Fairly, MD;  Location: [facility] ORS;  Service: Orthopedics;  Laterality: Left;  . TONSILLECTOMY      There were no vitals filed for this visit.      Subjective Assessment - 09/30/16 0847    Subjective Patient reports she is still taking medication to control pain. Hair care is still difficult because of raising above shoulder level.    Limitations House hold activities;Other (comment);Lifting   Patient Stated Goals pain relief to  be able to care for self, hair care, use left UE for housework, lifting pots, pans   Currently in Pain? Yes   Pain Score 2    Pain Location Shoulder   Pain Orientation Left   Pain Descriptors / Indicators Tightness;Discomfort   Pain Type  Chronic pain   Pain Onset More than a month ago   Pain Frequency Intermittent        Objective: AROM: pre treatment 0-105 AAROM: supine lying 0-130 Palpation: mild spasms palpable with mild tenderness pectoral muscle left shoulder  Treatment: Therapeutic exercise: patient performed exercises with verbal, tactile cues and demonstration of therapist: Supine lying: AAROM with assist of therapist3 x 10reps with VC and guided ROM to 140degrees flexion stretch feltat end range, ER/ IR through full ROM with increased stiffness at end range ER Rhythmic stabilization UE at 90 degrees 3sets 10 reps each direction flexion/extension and rotations in neutral position of shoulder Side lying: ER with towel between arm and trunk 1# weight 2 x 15 reps Abduction with assistance 2 x 10 reps Scapular retraction with manual resistance x 15 reps Standing: At wall flexion isometrics press into ball with staggered stance 2 x 10 both UE scaption at wall with ball x 20 reps with guidance for correct technique Band at wall for scapular retraction x 15 reps, through partial ROM through full ROM with each rep guided by pain Walk ball up wall x 10 reps with VC  Manual therapy: STM left upper anterior aspectof left shoulder, pectoral muscles and MFR pectoral muscleswith patient supine lying  Modalities: Moist heat applied to left shoulder at end of session with patient seated x  10 min.: no adverse skin reactions noted   Patient response to treatment: Patient improved ROM with decreased pain with guided exercises and with repetition. Reported decreased pain to mild <2/10 at end of session. Verbalized good understanding of home exercises and to perform through partial through partial to full ROM as pain allows. Increased substitution with hiking of left shoulder noted with standing exercises with repetition.         PT Education - 09/30/16 0849    Education provided Yes   Education Details HEP:  re assessed side lying exercises and ball on wall for flexion, partial push ups    Person(s) Educated Patient   Methods Explanation;Demonstration;Verbal cues   Comprehension Verbalized understanding;Returned demonstration;Verbal cues required;Need further instruction             PT Long Term Goals - 09/16/16 0901      PT LONG TERM GOAL #1   Title Patient will be independent in home exercise program to improve strength/mobility for better functional independence with ADLs b 10/28/2016.    Baseline limited knowledge of appropriate pain control strategies, exercise progression for strength and ROM   Status Revised     PT LONG TERM GOAL #2   Title Patient will improved Quick DASH score to 20% or less impairment demonstrating reduced self-reported upper extremity disability by 10/28/2016.    Baseline Quick dash 70%; current 07/16/16 40% impairment; 09/16/16 40% impairment   Status Revised     PT LONG TERM GOAL #3   Title Patient will increase LUE shoulder AROM: sitting: Shoulder flexion >140 degrees, abduction: >120 degrees,  for increased functional ROM with ADLs such as grooming, dressing etc by 10/28/2016   Baseline 09/16/16 AROM forward elevation 110, limited AROM ER and IR 30% with pain   Status Revised     PT LONG TERM GOAL #4   Title Patient will report pain level max. of <2/10 with right UE use by 10/28/2016 to allow improved function with overhead activities/personal care   Baseline pain leve max. 7/10 with using right UE for daily activities; current max level 5/10   Status Revised               Plan - 09/30/16 0851    Clinical Impression Statement Patient is progressing well with strength and ROM and continues with decreased ability to perform activities above shoulder.  She will benefit from continued physical therapy to address limitations in order to achieve maximal gains in strength and ROM for improved function with daily tasks.    Rehab Potential Fair   PT Frequency  2x / week   PT Duration 6 weeks   PT Treatment/Interventions Iontophoresis 4mg /ml Dexamethasone;Electrical Stimulation;Cryotherapy;Moist Heat;Ultrasound;Patient/family education;Neuromuscular re-education;Therapeutic exercise;Manual techniques;Scar mobilization;Taping;Dry needling   PT Next Visit Plan modalities for pain control, muslce re education, ther. ex for motor control, strength, manual STM   PT Home Exercise Plan scapular control, scaption for movement, pain control with heat/TENS, isometric band exercises and scapular retraction band exercise      Patient will benefit from skilled therapeutic intervention in order to improve the following deficits and impairments:  Impaired UE functional use, Increased muscle spasms, Decreased activity tolerance, Decreased knowledge of precautions, Pain, Impaired perceived functional ability, Decreased strength  Visit Diagnosis: Stiffness of left shoulder, not elsewhere classified  Muscle weakness (generalized)  Left shoulder pain, unspecified chronicity     Problem List Patient Active Problem List   Diagnosis Date Noted  . Pulmonary hypertension 08/16/2012  . POTS (postural orthostatic  tachycardia syndrome) 08/16/2012  . Chest pain 06/26/2012  . Shortness of breath 06/26/2012  . Rapid heart beat 06/26/2012    Beacher MayBrooks, Marie PT 09/30/2016, 9:37 AM  [facility] [address] [place], [name], [address] Phone: [phone]   Fax:  [phone]  Name: Claudia [name] MRN: [id number] Date of Birth: [birth year]

## 2016-10-02 ENCOUNTER — Ambulatory Visit: Payer: 59 | Admitting: Physical Therapy

## 2016-10-02 ENCOUNTER — Encounter: Payer: Self-pay | Admitting: Physical Therapy

## 2016-10-02 DIAGNOSIS — M6281 Muscle weakness (generalized): Secondary | ICD-10-CM | POA: Diagnosis not present

## 2016-10-02 DIAGNOSIS — M25512 Pain in left shoulder: Secondary | ICD-10-CM | POA: Diagnosis not present

## 2016-10-02 DIAGNOSIS — M62838 Other muscle spasm: Secondary | ICD-10-CM | POA: Diagnosis not present

## 2016-10-02 DIAGNOSIS — M25612 Stiffness of left shoulder, not elsewhere classified: Secondary | ICD-10-CM

## 2016-10-02 NOTE — Therapy (Signed)
[facility] [address] [place], [address] Phone: [phone]   Fax:  [phone]  Physical Therapy Treatment  Patient Details  Name: Claudia [name] MRN: [id number] Date of Birth: Jan 22, 1966 Referring Provider: Juanell Fairly, MD  Encounter Date: 10/02/2016      PT End of Session - 10/02/16 0900    Visit Number 35   Number of Visits 44   Date for PT Re-Evaluation 10/28/16   PT Start Time 0857   PT Stop Time 0950   PT Time Calculation (min) 53 min   Activity Tolerance Patient tolerated treatment well;Patient limited by pain   Behavior During Therapy [facility] for tasks assessed/performed      Past Medical History:  Diagnosis Date  . Arthritis    knees, back;   . Asthma    H/O YEARS AGO-NO INHALERS  . Difficult intubation   . Dysrhythmia    H/O [facility] PROBLEMS SINCE 2013  . Rotator cuff tear    left    Past Surgical History:  Procedure Laterality Date  . CARPAL TUNNEL RELEASE     x2  . CHOLECYSTECTOMY    . COLONOSCOPY    . SHOULDER ARTHROSCOPY WITH OPEN ROTATOR CUFF REPAIR Left 10/31/2015   Procedure: SHOULDER ARTHROSCOPY, SUBACROMIAL DECOMPRESSION, BICEPS TENOTOMY AND MINI OPEN ROTATOR CUFF REPAIR;  Surgeon: Juanell Fairly, MD;  Location: [facility] ORS;  Service: Orthopedics;  Laterality: Left;  . TONSILLECTOMY      There were no vitals filed for this visit.      Subjective Assessment - 10/02/16 0857    Subjective Patient reports she did not have any problems to report following previous session.    Limitations House hold activities;Other (comment);Lifting   Patient Stated Goals pain relief to  be able to care for self, hair care, use left UE for housework, lifting pots, pans   Currently in Pain? Yes   Pain Score 1    Pain Location Shoulder   Pain Orientation Left   Pain Descriptors / Indicators Tightness;Discomfort   Pain Type Chronic pain   Pain Onset More than a month ago   Pain  Frequency Intermittent         Objective:     AROM seated pre treatment: 0-105; post treatment 0-110    AAROM: supine lying 0-145 degrees forward elevation, full ER/IR    Strength: forward elevation 3-/5, ER 3+/5      Treatment: Therapeutic exercise: patient performed exercises with verbal, tactile cues and demonstration of therapist: Supine lying: AAROM with assist of therapist3 x 10reps with VC and guided ROM to 140degrees flexion stretch feltat end range, ER/ IR through full ROM with increased stiffness at end range ER Rhythmic stabilizationUE at 90 degrees 3sets 10 reps each direction flexion/extension and rotations in neutral position of shoulder Side lying: ER with towel between arm and trunk 1# weight 2 x 15 reps Abduction with assistance 2 x 10 reps Scapular retraction with manual resistance x 15 reps Standing: At wall flexion isometrics press into ball with staggered stance 2 x 10 both UE Walk ball up walk with VC and instruction for proper alignment of shoulder/scapular control 15 reps Band at wall for scapular retraction x 20 reps, through partial ROM through full ROM with each rep   Manual therapy: STM left upper trapezius muscleanterior aspectof left shoulder, pectoral muscles and MFR pectoral muscleswith patient supine lying, sitting  Modalities: Moist heat applied to left shoulder at end of session  with patient seated x 10 min.: no adverse skin reactions noted   Patient response to treatment: patient demonstrated improved technique with exercises with minimal VC for correct alignment. Patient with mild increased pain in left shoulder with treatment, decreased following moist heat at end of session. Patient with decreased spasms and improved ROM with less discomfort following STM.Patient improved motor control with all exercises with repetition and VC/demonstration as needed.         PT Education - 10/02/16 0900    Education provided Yes   Education  Details HEP re assessed home exercises for strengthening deltoid and scapular muscles   Person(s) Educated Patient   Methods Explanation;Demonstration;Verbal cues   Comprehension Verbalized understanding;Returned demonstration;Verbal cues required             PT Long Term Goals - 09/16/16 0901      PT LONG TERM GOAL #1   Title Patient will be independent in home exercise program to improve strength/mobility for better functional independence with ADLs b 10/28/2016.    Baseline limited knowledge of appropriate pain control strategies, exercise progression for strength and ROM   Status Revised     PT LONG TERM GOAL #2   Title Patient will improved Quick DASH score to 20% or less impairment demonstrating reduced self-reported upper extremity disability by 10/28/2016.    Baseline Quick dash 70%; current 07/16/16 40% impairment; 09/16/16 40% impairment   Status Revised     PT LONG TERM GOAL #3   Title Patient will increase LUE shoulder AROM: sitting: Shoulder flexion >140 degrees, abduction: >120 degrees,  for increased functional ROM with ADLs such as grooming, dressing etc by 10/28/2016   Baseline 09/16/16 AROM forward elevation 110, limited AROM ER and IR 30% with pain   Status Revised     PT LONG TERM GOAL #4   Title Patient will report pain level max. of <2/10 with right UE use by 10/28/2016 to allow improved function with overhead activities/personal care   Baseline pain leve max. 7/10 with using right UE for daily activities; current max level 5/10   Status Revised               Plan - 10/02/16 0901    Clinical Impression Statement Patient is progressing well with strength and ROM and continues with difficulty with overhead activity due to decreased strength and endurance. She should continue to improve with additional physical therapy intervention.   Rehab Potential Fair   PT Frequency 2x / week   PT Duration 6 weeks   PT Treatment/Interventions Iontophoresis 4mg /ml  Dexamethasone;Electrical Stimulation;Cryotherapy;Moist Heat;Ultrasound;Patient/family education;Neuromuscular re-education;Therapeutic exercise;Manual techniques;Scar mobilization;Taping;Dry needling   PT Next Visit Plan modalities for pain control, muslce re education, ther. ex for motor control, strength, manual STM   PT Home Exercise Plan scapular control, scaption for movement, pain control with heat/TENS, isometric band exercises and scapular retraction band exercise      Patient will benefit from skilled therapeutic intervention in order to improve the following deficits and impairments:  Impaired UE functional use, Increased muscle spasms, Decreased activity tolerance, Decreased knowledge of precautions, Pain, Impaired perceived functional ability, Decreased strength  Visit Diagnosis: Stiffness of left shoulder, not elsewhere classified  Muscle weakness (generalized)  Left shoulder pain, unspecified chronicity     Problem List Patient Active Problem List   Diagnosis Date Noted  . Pulmonary hypertension 08/16/2012  . POTS (postural orthostatic tachycardia syndrome) 08/16/2012  . Chest pain 06/26/2012  . Shortness of breath 06/26/2012  . Rapid heart  beat 06/26/2012    Beacher May PT 10/03/2016, 1:34 PM   [facility] PHYSICAL AND SPORTS MEDICINE [address], [place], [address] Phone: [phone]   Fax:  [phone]  Name: Claudia [name] MRN: [id number] Date of Birth: 1966-01-14

## 2016-10-07 ENCOUNTER — Encounter: Payer: Self-pay | Admitting: Physical Therapy

## 2016-10-07 ENCOUNTER — Ambulatory Visit: Payer: 59

## 2016-10-07 DIAGNOSIS — M25612 Stiffness of left shoulder, not elsewhere classified: Secondary | ICD-10-CM | POA: Diagnosis not present

## 2016-10-07 DIAGNOSIS — M6281 Muscle weakness (generalized): Secondary | ICD-10-CM

## 2016-10-07 DIAGNOSIS — M62838 Other muscle spasm: Secondary | ICD-10-CM | POA: Diagnosis not present

## 2016-10-07 DIAGNOSIS — M25512 Pain in left shoulder: Secondary | ICD-10-CM | POA: Diagnosis not present

## 2016-10-07 NOTE — Therapy (Signed)
[place] [facility] MEDICINE [address] [place], [address] Phone: [phone]   Fax:  [phone]  Physical Therapy Treatment  Patient Details  Name: Claudia [name] MRN: [id number] Date of Birth: [birth year] Referring Provider: Juanell Fairly, MD  Encounter Date: 10/07/2016      PT End of Session - 10/07/16 0843    Visit Number 36   Number of Visits 44   Date for PT Re-Evaluation 10/28/16   PT Start Time 0845   PT Stop Time 0930   PT Time Calculation (min) 45 min   Activity Tolerance Patient tolerated treatment well   Behavior During Therapy [facility] for tasks assessed/performed      Past Medical History:  Diagnosis Date  . Arthritis    knees, back;   . Asthma    H/O YEARS AGO-NO INHALERS  . Difficult intubation   . Dysrhythmia    H/O [facility] PROBLEMS SINCE 2013  . Rotator cuff tear    left    Past Surgical History:  Procedure Laterality Date  . CARPAL TUNNEL RELEASE     x2  . CHOLECYSTECTOMY    . COLONOSCOPY    . SHOULDER ARTHROSCOPY WITH OPEN ROTATOR CUFF REPAIR Left 10/31/2015   Procedure: SHOULDER ARTHROSCOPY, SUBACROMIAL DECOMPRESSION, BICEPS TENOTOMY AND MINI OPEN ROTATOR CUFF REPAIR;  Surgeon: Juanell Fairly, MD;  Location: [facility] ORS;  Service: Orthopedics;  Laterality: Left;  . TONSILLECTOMY      There were no vitals filed for this visit.      Subjective Assessment - 10/07/16 0841    Subjective Pt reports she is doing well on this date. She is performing HEP without issue. Reports 1/10 pain at this time upon arrival. She took Tylenol at midnight last night and Meloxicam this morning.    Pertinent History left shoulder pain over the last couple of years when she had a pop in left shoulder after raising arm in shower. She was treated conservatively with PT and medication and taken OOW 3 months. She was able to return to work at that time and pain progressed and she had further work up with  diagnosis of RTC tear. She underwent surgery 10/31/15 for RTC and has had PT s/p RTC with continued pain as primary limiting factor to full function.   Limitations House hold activities;Other (comment);Lifting   Diagnostic tests had another MRI which showed partial thickness tear left shoulder/RTC: supraspinatus tendon   Patient Stated Goals pain relief to  be able to care for self, hair care, use left UE for housework, lifting pots, pans   Currently in Pain? Yes   Pain Score 1    Pain Location Shoulder   Pain Orientation Left   Pain Descriptors / Indicators Discomfort   Pain Type Chronic pain   Pain Onset More than a month ago   Pain Frequency Intermittent   Multiple Pain Sites No          Objective:   Therapeutic exercise:  Patient performed exercises with verbal, tactile cues and demonstration of therapist: Supine lying: PROM L shoulder for flexion, scaption, IR, and ER with 10s hold at end range Rhythmic stabilization UE at 90 degrees 3 sets x 30 seconds each in all directions;  L shoulder serratus punches 3 x 10 with manual resistance;  Side lying: ER with towel between arm and trunk  1# weight 3 x 10 reps; Abduction 1# weight 3 x 10 reps;  Seated: Green tband rows 3 x 10;  Standing: At wall serratus isometrics press into ball with staggered stance 2 x 10 both UE; Bilateral UE walk ball up wall 2 x 10 reps; LUE only ball circles on wall CW x 10, CCW x 10, 2 reps each;  Manual therapy: STM left upper trapezius muscle as well as anterior and middle deltoid with patient in sitting   Modalities: Moist heat applied to left shoulder at end of session with patient seated x 10 min (unbilled).: no adverse skin reactions noted    Patient response to treatment: patient demonstrated: patient with mild increased pain in left shoulder with treatment, decreased following moist heat at end of session. Patient with gradually improving ROM throughout session with less discomfort  following STM.                        PT Education - 10/07/16 0843    Education provided Yes   Education Details HEP Reinforced, exercise form/technique during session   Person(s) Educated Patient   Methods Explanation   Comprehension Verbalized understanding             PT Long Term Goals - 09/16/16 0901      PT LONG TERM GOAL #1   Title Patient will be independent in home exercise program to improve strength/mobility for better functional independence with ADLs b 10/28/2016.    Baseline limited knowledge of appropriate pain control strategies, exercise progression for strength and ROM   Status Revised     PT LONG TERM GOAL #2   Title Patient will improved Quick DASH score to 20% or less impairment demonstrating reduced self-reported upper extremity disability by 10/28/2016.    Baseline Quick dash 70%; current 07/16/16 40% impairment; 09/16/16 40% impairment   Status Revised     PT LONG TERM GOAL #3   Title Patient will increase LUE shoulder AROM: sitting: Shoulder flexion >140 degrees, abduction: >120 degrees,  for increased functional ROM with ADLs such as grooming, dressing etc by 10/28/2016   Baseline 09/16/16 AROM forward elevation 110, limited AROM ER and IR 30% with pain   Status Revised     PT LONG TERM GOAL #4   Title Patient will report pain level max. of <2/10 with right UE use by 10/28/2016 to allow improved function with overhead activities/personal care   Baseline pain leve max. 7/10 with using right UE for daily activities; current max level 5/10   Status Revised               Plan - 10/07/16 0843    Clinical Impression Statement Pt demonstrates good response to treatment on this date. Slight increase in pain during ther-ex but improves at end of session with STM and moist heat. No new exercises added to HEP on this date. Pt encouraged to continue current HEP and follow-up as scheduled.    Rehab Potential Fair   Clinical Impairments  Affecting Rehab Potential (+) age, motivated (-)chronic pain right shoulder even following surgery 10/2015   PT Frequency 2x / week   PT Duration 6 weeks   PT Treatment/Interventions Iontophoresis 4mg /ml Dexamethasone;Electrical Stimulation;Cryotherapy;Moist Heat;Ultrasound;Patient/family education;Neuromuscular re-education;Therapeutic exercise;Manual techniques;Scar mobilization;Taping;Dry needling   PT Next Visit Plan modalities for pain control, muslce re education, ther. ex for motor control, strength, manual STM   PT Home Exercise Plan scapular control, scaption for movement, pain control with heat/TENS, isometric band exercises and scapular retraction band exercise      Patient will benefit from skilled therapeutic intervention in order  to improve the following deficits and impairments:  Impaired UE functional use, Increased muscle spasms, Decreased activity tolerance, Decreased knowledge of precautions, Pain, Impaired perceived functional ability, Decreased strength  Visit Diagnosis: Muscle weakness (generalized)     Problem List Patient Active Problem List   Diagnosis Date Noted  . Pulmonary hypertension 08/16/2012  . POTS (postural orthostatic tachycardia syndrome) 08/16/2012  . Chest pain 06/26/2012  . Shortness of breath 06/26/2012  . Rapid heart beat 06/26/2012   Lynnea MaizesJason D Nayah Lukens PT, DPT   Mukhtar Shams 10/07/2016, 10:19 AM  [place] [facility] [address] [place], [name], [address] Phone: [phone]   Fax:  [phone]  Name: Claudia [name] MRN: [id number] Date of Birth: 03/09/1966

## 2016-10-09 ENCOUNTER — Ambulatory Visit: Payer: 59 | Admitting: Physical Therapy

## 2016-10-09 ENCOUNTER — Encounter: Payer: Self-pay | Admitting: Physical Therapy

## 2016-10-09 DIAGNOSIS — M6281 Muscle weakness (generalized): Secondary | ICD-10-CM | POA: Diagnosis not present

## 2016-10-09 DIAGNOSIS — M25612 Stiffness of left shoulder, not elsewhere classified: Secondary | ICD-10-CM | POA: Diagnosis not present

## 2016-10-09 DIAGNOSIS — M25512 Pain in left shoulder: Secondary | ICD-10-CM | POA: Diagnosis not present

## 2016-10-09 DIAGNOSIS — M62838 Other muscle spasm: Secondary | ICD-10-CM | POA: Diagnosis not present

## 2016-10-09 NOTE — Therapy (Signed)
[place] [facility] [address] [place], [name], [address] Phone: [phone]   Fax:  [phone]  Physical Therapy Treatment  Patient Details  Name: Claudia [name] MRN: [id number] Date of Birth: [birth year] Referring Provider: Juanell FairlyKrasinski, Kevin, MD  Encounter Date: 10/09/2016      PT End of Session - 10/09/16 0819    Visit Number 37   Number of Visits 44   Date for PT Re-Evaluation 10/28/16   PT Start Time 0810   PT Stop Time 0910   PT Time Calculation (min) 60 min   Activity Tolerance Patient tolerated treatment well   Behavior [facility] Therapy [facility] for tasks assessed/performed      Past Medical History:  Diagnosis Date  . Arthritis    knees, back;   . Asthma    H/O YEARS AGO-NO INHALERS  . Difficult intubation   . [facility]ia    [facility]  . Rotator cuff tear    left    Past Surgical History:  Procedure Laterality Date  . CARPAL TUNNEL RELEASE     x2  . CHOLECYSTECTOMY    . COLONOSCOPY    . SHOULDER ARTHROSCOPY WITH OPEN ROTATOR CUFF REPAIR Left 10/31/2015   Procedure: SHOULDER ARTHROSCOPY, SUBACROMIAL DECOMPRESSION, BICEPS TENOTOMY AND MINI OPEN ROTATOR CUFF REPAIR;  Surgeon: Juanell FairlyKevin Krasinski, MD;  Location: [facility] ORS;  Service: Orthopedics;  Laterality: Left;  . TONSILLECTOMY      There were no vitals filed for this visit.      Subjective Assessment - 10/09/16 0813    Subjective Patient reports she feels she is improving and has good and bad days with stiffness and pain averages 2/10 and may go up to a 3-4/10. Increased pain with sleeping on that side. She continues to control pan with medication.    Limitations House hold activities;Other (comment);Lifting   Patient Stated Goals pain relief to  be able to care for self, hair care, use left UE for housework, lifting pots, pans   Currently in Pain? Yes   Pain Score 1    Pain Location Shoulder   Pain Orientation Left   Pain  Descriptors / Indicators Discomfort   Pain Type Chronic pain   Pain Onset More than a month ago   Pain Frequency Intermittent         Objective:     AROM seated pre treatment: 0-100; post treatment on pulleys 115    Palpation: left shoulder with increased tone, spasms upper trapezius and pectoral muscle with increased tenderness to palpation      Treatment: Therapeutic exercise: patient performed exercises with verbal, tactile cues and demonstration of therapist: Seated: Pulleys for warm up with cuing for correct shoulder positioning, and to decrease substitiution Supine lying: AAROM with assist of therapist3x 10reps with VC and guided ROM to 145degrees flexion stretch feltat end range, ER/ IR through full ROM with increased stiffness at end range ER Rhythmic stabilizationUE at 90 degrees 3sets 10 reps CW/CCW with 2# weight in hand  Side lying: ER with towel between arm and trunk 2# weight 1 x 15 reps; 1 x 10 reps Abduction with assistance 2 x 10 reps with repositioning of shoulder Scapular retraction with manual resistance x 15 reps; moderate resistance with VC for correct technique Standing: Cable scapular row single arm left UE 5# x 15 reps, 7# x 15 reps with tactile cues for correct technique, muscle facilitation; right UE 10# 2 x 15 reps with good  technique At wall; ER x 20 reps  Forward elevation holding ball with both UE's x 15 reps with VC to perform without substitution left shoulder    Manual therapy: STM left upper trapezius and anterior left shoulder, pectoral muscles superficial techniques with patient seated: goal: pain, spasms  Modalities: Moist heat applied to left shoulder at end of session with patient seated x 10 min.: no adverse skin reactions noted   Patient response to treatment: Patient demonstrated improved technique with pulleys, side lying rotations and abduction with VC, tactile cuing for correct muscle activation. Mild fatigue noted with all  exercises without increased pain reported. Decreased tenderness to mild and spasms decreased by 50% with STM and moist heat with no soreness reported at end of session.         PT Education - 10/09/16 0900    Education provided Yes   Education Details HEP: pulleys, work on not overstretching and work within good shoulder positioning, not hiking; scapular retraction building endurance, strength   Person(s) Educated Patient   Methods Explanation;Demonstration;Verbal cues   Comprehension Verbalized understanding;Returned demonstration;Verbal cues required             PT Long Term Goals - 09/16/16 0901      PT LONG TERM GOAL #1   Title Patient will be independent in home exercise program to improve strength/mobility for better functional independence with ADLs b 10/28/2016.    Baseline limited knowledge of appropriate pain control strategies, exercise progression for strength and ROM   Status Revised     PT LONG TERM GOAL #2   Title Patient will improved Quick DASH score to 20% or less impairment demonstrating reduced self-reported upper extremity disability by 10/28/2016.    Baseline Quick dash 70%; current 07/16/16 40% impairment; 09/16/16 40% impairment   Status Revised     PT LONG TERM GOAL #3   Title Patient will increase LUE shoulder AROM: sitting: Shoulder flexion >140 degrees, abduction: >120 degrees,  for increased functional ROM with ADLs such as grooming, dressing etc by 10/28/2016   Baseline 09/16/16 AROM forward elevation 110, limited AROM ER and IR 30% with pain   Status Revised     PT LONG TERM GOAL #4   Title Patient will report pain level max. of <2/10 with right UE use by 10/28/2016 to allow improved function with overhead activities/personal care   Baseline pain leve max. 7/10 with using right UE for daily activities; current max level 5/10   Status Revised               Plan - 10/09/16 0819    Clinical Impression Statement Paitent is progressing with goals  with improved understanding of home exercises and improved controlled motion with pulleys and scapular control exercises with VC. She continues with weakness, decreased endurance and stiffness in left shoulder which limits functional activities at shoulder level and above and she continues to require cuing to perform exercises with correct alignment.    Rehab Potential Fair   PT Frequency 2x / week   PT Duration 6 weeks   PT Treatment/Interventions Iontophoresis 4mg /ml Dexamethasone;Electrical Stimulation;Cryotherapy;Moist Heat;Ultrasound;Patient/family education;Neuromuscular re-education;Therapeutic exercise;Manual techniques;Scar mobilization;Taping;Dry needling   PT Next Visit Plan modalities for pain control, muslce re education, ther. ex for motor control, strength, manual STM   PT Home Exercise Plan scapular control, scaption for movement, pain control with heat/TENS, isometric band exercises and scapular retraction band exercise      Patient will benefit from skilled therapeutic intervention in order to  improve the following deficits and impairments:  Impaired UE functional use, Increased muscle spasms, Decreased activity tolerance, Decreased knowledge of precautions, Pain, Impaired perceived functional ability, Decreased strength  Visit Diagnosis: Muscle weakness (generalized)  Stiffness of left shoulder, not elsewhere classified  Left shoulder pain, unspecified chronicity  Other muscle spasm     Problem List Patient Active Problem List   Diagnosis Date Noted  . Pulmonary hypertension 08/16/2012  . POTS (postural orthostatic tachycardia syndrome) 08/16/2012  . Chest pain 06/26/2012  . Shortness of breath 06/26/2012  . Rapid heart beat 06/26/2012    Beacher May PT 10/09/2016, 9:35 AM  [place] [facility] PHYSICAL AND SPORTS MEDICINE [address] [place], [address] Phone: [phone]   Fax:  [phone]  Name: Claudia [name] MRN: [id number] Date of Birth: 1965/10/14

## 2016-10-15 ENCOUNTER — Ambulatory Visit: Payer: 59 | Admitting: Physical Therapy

## 2016-10-15 ENCOUNTER — Encounter: Payer: Self-pay | Admitting: Physical Therapy

## 2016-10-15 DIAGNOSIS — M6281 Muscle weakness (generalized): Secondary | ICD-10-CM

## 2016-10-15 DIAGNOSIS — M25512 Pain in left shoulder: Secondary | ICD-10-CM | POA: Diagnosis not present

## 2016-10-15 DIAGNOSIS — M25612 Stiffness of left shoulder, not elsewhere classified: Secondary | ICD-10-CM

## 2016-10-15 DIAGNOSIS — M62838 Other muscle spasm: Secondary | ICD-10-CM | POA: Diagnosis not present

## 2016-10-16 NOTE — Therapy (Signed)
[place] [facility]TS MEDICINE [address] [place], [address] Phone: [phone]   Fax:  [phone]  Physical Therapy Treatment  Patient Details  Name: Claudia [name] MRN: [id number] Date of Birth: [birth year] Referring Provider: Juanell Fairly, MD  Encounter Date: 10/15/2016      PT End of Session - 10/15/16 0949    Visit Number 38   Number of Visits 44   Date for PT Re-Evaluation 10/28/16   PT Start Time 0907   PT Stop Time 1000   PT Time Calculation (min) 53 min   Activity Tolerance Patient tolerated treatment well   Behavior During Therapy [facility] for tasks assessed/performed      Past Medical History:  Diagnosis Date  . Arthritis    knees, back;   . Asthma    H/O YEARS AGO-NO INHALERS  . Difficult intubation   . Dysrhythmia    H/O [facility] PROBLEMS SINCE 2013  . Rotator cuff tear    left    Past Surgical History:  Procedure Laterality Date  . CARPAL TUNNEL RELEASE     x2  . CHOLECYSTECTOMY    . COLONOSCOPY    . SHOULDER ARTHROSCOPY WITH OPEN ROTATOR CUFF REPAIR Left 10/31/2015   Procedure: SHOULDER ARTHROSCOPY, SUBACROMIAL DECOMPRESSION, BICEPS TENOTOMY AND MINI OPEN ROTATOR CUFF REPAIR;  Surgeon: Juanell Fairly, MD;  Location: [facility] ORS;  Service: Orthopedics;  Laterality: Left;  . TONSILLECTOMY      There were no vitals filed for this visit.      Subjective Assessment - 10/15/16 0908    Subjective Patient reports no proglems since previous session. she continues to feel increased stiffness and has difficulty with hair care with raising left arm above shoulder level.   Limitations House hold activities;Other (comment);Lifting   Patient Stated Goals pain relief to  be able to care for self, hair care, use left UE for housework, lifting pots, pans   Currently in Pain? Yes   Pain Score 1    Pain Location Shoulder   Pain Orientation Left   Pain Descriptors / Indicators Discomfort   Pain Type  Chronic pain   Pain Onset More than a month ago   Pain Frequency Intermittent      Objective:  AROM seated pre treatment: 0-120 Palpation: left shoulder: + spasms upper trapezius, pectoral muscles    Treatment: Therapeutic exercise: patient performed exercises with verbal, tactile cues and demonstration of therapist: Seated: Supine lying: AAROM with assist of therapist3x 10reps with VC and guided ROM to 150degrees flexion stretch feltat end range, ER/ IR through full ROM with increased stiffness at end range ER Rhythmic stabilizationUE at 90 degrees 3sets 10 reps CW/CCW with 2# weight in hand  Side lying: ER with towel between arm and trunk 2# weight 2 x 15 Abduction with assistance 2 x 10 reps with repositioning of shoulder Scapular retraction with manual resistance x 15 reps; moderate resistance with VC for correct technique Standing: Cable scapular row single arm left UE 7# 2 x 15 reps, with tactile cues for correct technique, muscle facilitation; right UE 10# 2 x 15 reps with good technique Seated row bilateral UE's 10# x 15 reps  Manual therapy: STM left upper trapezius and anterior left shoulder, pectoral muscles superficial techniques with patient seated and supine: goal: pain, spasms  Modalities: Moist heat applied to left shoulder at end of session with patient seated x 10 min.: no adverse skin reactions noted  Patient response to treatment: Patient  with decreased spasms to mild, decreased tenderness and improved shoulder flexibility through increased ROM without pain following STM to upper trapezius and pectoral muscles. Patient required decreased cuing for correct technique for exercises for scapular retraction, supine exercises, moderate VC required for side lying exercises.           PT Education - 10/15/16 1000    Education provided Yes   Education Details HEP; re inforced exercises to perform in lying and standing with proper alignment of  shoulder   Person(s) Educated Patient   Methods Explanation;Verbal cues;Demonstration;Tactile cues   Comprehension Verbalized understanding;Returned demonstration;Verbal cues required;Tactile cues required             PT Long Term Goals - 09/16/16 0901      PT LONG TERM GOAL #1   Title Patient will be independent in home exercise program to improve strength/mobility for better functional independence with ADLs b 10/28/2016.    Baseline limited knowledge of appropriate pain control strategies, exercise progression for strength and ROM   Status Revised     PT LONG TERM GOAL #2   Title Patient will improved Quick DASH score to 20% or less impairment demonstrating reduced self-reported upper extremity disability by 10/28/2016.    Baseline Quick dash 70%; current 07/16/16 40% impairment; 09/16/16 40% impairment   Status Revised     PT LONG TERM GOAL #3   Title Patient will increase LUE shoulder AROM: sitting: Shoulder flexion >140 degrees, abduction: >120 degrees,  for increased functional ROM with ADLs such as grooming, dressing etc by 10/28/2016   Baseline 09/16/16 AROM forward elevation 110, limited AROM ER and IR 30% with pain   Status Revised     PT LONG TERM GOAL #4   Title Patient will report pain level max. of <2/10 with right UE use by 10/28/2016 to allow improved function with overhead activities/personal care   Baseline pain leve max. 7/10 with using right UE for daily activities; current max level 5/10   Status Revised               Plan - 10/15/16 1008    Clinical Impression Statement Patient is progressing with goals with improving AROM indicating improving strength. She is now able to perform personal care activities with less difficulty as well. She continues with limitations of decreased strength in left shoulder, spasms in left shoulder region and decreased endurance for functional activities using left UE. She will continue to benefit from physical therapy  intervention to achieve maximal functional use lef tUE.    Rehab Potential Fair   PT Frequency 2x / week   PT Duration 6 weeks   PT Treatment/Interventions Iontophoresis 4mg /ml Dexamethasone;Electrical Stimulation;Cryotherapy;Moist Heat;Ultrasound;Patient/family education;Neuromuscular re-education;Therapeutic exercise;Manual techniques;Scar mobilization;Taping;Dry needling   PT Next Visit Plan modalities for pain control, muslce re education, ther. ex for motor control, strength, manual STM   PT Home Exercise Plan scapular control, scaption for movement, pain control with heat/TENS, isometric band exercises and scapular retraction band exercise      Patient will benefit from skilled therapeutic intervention in order to improve the following deficits and impairments:  Impaired UE functional use, Increased muscle spasms, Decreased activity tolerance, Decreased knowledge of precautions, Pain, Impaired perceived functional ability, Decreased strength  Visit Diagnosis: Muscle weakness (generalized)  Stiffness of left shoulder, not elsewhere classified  Left shoulder pain, unspecified chronicity  Other muscle spasm     Problem List Patient Active Problem List   Diagnosis Date Noted  . Pulmonary hypertension 08/16/2012  .  POTS (postural orthostatic tachycardia syndrome) 08/16/2012  . Chest pain 06/26/2012  . Shortness of breath 06/26/2012  . Rapid heart beat 06/26/2012    Beacher MayBrooks, Marie PT 10/16/2016, 1:38 PM  [place] [facility] [address] [place], [name], [address] Phone: [phone]   Fax:  [phone]  Name: Hortencia PilarLawana A Pais MRN: [id number] Date of Birth: 1966-02-08

## 2016-10-17 ENCOUNTER — Ambulatory Visit: Payer: 59 | Admitting: Physical Therapy

## 2016-10-17 ENCOUNTER — Encounter: Payer: Self-pay | Admitting: Physical Therapy

## 2016-10-17 DIAGNOSIS — M62838 Other muscle spasm: Secondary | ICD-10-CM | POA: Diagnosis not present

## 2016-10-17 DIAGNOSIS — M25612 Stiffness of left shoulder, not elsewhere classified: Secondary | ICD-10-CM

## 2016-10-17 DIAGNOSIS — M25512 Pain in left shoulder: Secondary | ICD-10-CM

## 2016-10-17 DIAGNOSIS — M6281 Muscle weakness (generalized): Secondary | ICD-10-CM | POA: Diagnosis not present

## 2016-10-17 NOTE — Therapy (Signed)
[facility] [address], [place], [address] Phone: [phone]   Fax:  [phone]  Physical Therapy Treatment  Patient Details  Name: Claudia [name] MRN: [id number] Date of Birth: August 25, 1966 Referring Provider: Juanell Fairly, MD  Encounter Date: 10/17/2016      PT End of Session - 10/17/16 1000    Visit Number 39   Number of Visits 44   Date for PT Re-Evaluation 10/28/16   PT Start Time 0852   PT Stop Time 0945   PT Time Calculation (min) 53 min   Activity Tolerance Patient tolerated treatment well   Behavior During Therapy [facility] for tasks assessed/performed      Past Medical History:  Diagnosis Date  . Arthritis    knees, back;   . Asthma    H/O YEARS AGO-NO INHALERS  . Difficult intubation   . Dysrhythmia    H/O [facility] PROBLEMS SINCE 2013  . Rotator cuff tear    left    Past Surgical History:  Procedure Laterality Date  . CARPAL TUNNEL RELEASE     x2  . CHOLECYSTECTOMY    . COLONOSCOPY    . SHOULDER ARTHROSCOPY WITH OPEN ROTATOR CUFF REPAIR Left 10/31/2015   Procedure: SHOULDER ARTHROSCOPY, SUBACROMIAL DECOMPRESSION, BICEPS TENOTOMY AND MINI OPEN ROTATOR CUFF REPAIR;  Surgeon: Juanell Fairly, MD;  Location: [facility] ORS;  Service: Orthopedics;  Laterality: Left;  . TONSILLECTOMY      There were no vitals filed for this visit.      Subjective Assessment - 10/17/16 0900    Subjective Patient reports no problems since previous session. She continues to feel increased stiffness and has difficulty with hair care with raising left arm above shoulder level.   Limitations House hold activities;Other (comment);Lifting   Patient Stated Goals pain relief to  be able to care for self, hair care, use left UE for housework, lifting pots, pans   Currently in Pain? Yes   Pain Score 2    Pain Location Shoulder   Pain Orientation Left   Pain Descriptors / Indicators Discomfort;Tightness    Pain Type Chronic pain   Pain Onset More than a month ago   Pain Frequency Intermittent      Objective:  AROM seated pre treatment: 0-110; 0-110 post treatment Palpation: left shoulder: + spasms upper trapezius, pectoral muscles and posterior aspect of shoulder    Treatment: Therapeutic exercise: patient performed exercises with verbal, tactile cues and demonstration of therapist: Supine lying: AAROM with assist of therapist3x 10reps with VC and guided ROM to 150degrees flexion stretch feltat end range, ER/ IR through full ROM with increased stiffness at end range ER Rhythmic stabilizationUE at 90 degrees 3sets 10 reps CW/CCW with 2# weight in hand       Rhythmic stabilization: moderate manual resistance given for flexion/extension @ 90 degrees flexion 3 x 10; IR/ER in scapular plane 3 x 10 reps neutral position Side lying: ER with towel between arm and trunk 2# weight 2 x 15 Abduction with assistance 2 x 10 reps with repositioning of shoulder Scapular retraction with manual resistance x 15 reps; moderate resistance with VC for correct technique Standing: Cable scapular row single arm left UE 7# 2 x 15 reps, with tactile cues for correct technique, muscle facilitation; right UE 10# 2 x 15 reps with good technique Straight arm pull down 15# x 10 reps Seated row bilateral UE's 10# x 15 reps  Manual therapy: STM left upper trapezius  and anterior left shoulder, pectoral muscles superficial techniques with patient seated and supine: goal: pain, spasms  Modalities: Moist heat applied to left shoulder at end of session with patient seated x 10 min.: no adverse skin reactions noted      Patient response to treatment: Patient able to perform exercises with increased motor control for scapular stabilization with minimal VC/tactile cuing. Improved spasms to mild and decreased tenderness to mild following STM.         PT Education - 10/17/16 0906    Education provided Yes    Education Details HEP: continue with home program for ROM and strengthening in supine/standing    Person(s) Educated Patient   Methods Explanation   Comprehension Verbalized understanding             PT Long Term Goals - 09/16/16 0901      PT LONG TERM GOAL #1   Title Patient will be independent in home exercise program to improve strength/mobility for better functional independence with ADLs b 10/28/2016.    Baseline limited knowledge of appropriate pain control strategies, exercise progression for strength and ROM   Status Revised     PT LONG TERM GOAL #2   Title Patient will improved Quick DASH score to 20% or less impairment demonstrating reduced self-reported upper extremity disability by 10/28/2016.    Baseline Quick dash 70%; current 07/16/16 40% impairment; 09/16/16 40% impairment   Status Revised     PT LONG TERM GOAL #3   Title Patient will increase LUE shoulder AROM: sitting: Shoulder flexion >140 degrees, abduction: >120 degrees,  for increased functional ROM with ADLs such as grooming, dressing etc by 10/28/2016   Baseline 09/16/16 AROM forward elevation 110, limited AROM ER and IR 30% with pain   Status Revised     PT LONG TERM GOAL #4   Title Patient will report pain level max. of <2/10 with right UE use by 10/28/2016 to allow improved function with overhead activities/personal care   Baseline pain leve max. 7/10 with using right UE for daily activities; current max level 5/10   Status Revised               Plan - 10/17/16 0950    Clinical Impression Statement Patient continues to progress well towards goals with improvement noted in strength and abiltiy to perform hair care at home wiht less difficulty. She continues with decreased endurance and strength left UE and will benefit from addiditonal physical therapy intervention to achieve maximal functional use of left UE.    Rehab Potential Fair   PT Frequency 2x / week   PT Duration 6 weeks   PT  Treatment/Interventions Iontophoresis 4mg /ml Dexamethasone;Electrical Stimulation;Cryotherapy;Moist Heat;Ultrasound;Patient/family education;Neuromuscular re-education;Therapeutic exercise;Manual techniques;Scar mobilization;Taping;Dry needling   PT Next Visit Plan modalities for pain control, muslce re education, ther. ex for motor control, strength, manual STM   PT Home Exercise Plan scapular control, scaption for movement, pain control with heat/TENS, isometric band exercises and scapular retraction band exercise      Patient will benefit from skilled therapeutic intervention in order to improve the following deficits and impairments:  Impaired UE functional use, Increased muscle spasms, Decreased activity tolerance, Decreased knowledge of precautions, Pain, Impaired perceived functional ability, Decreased strength  Visit Diagnosis: Stiffness of left shoulder, not elsewhere classified  Muscle weakness (generalized)  Left shoulder pain, unspecified chronicity  Other muscle spasm     Problem List Patient Active Problem List   Diagnosis Date Noted  . Pulmonary hypertension 08/16/2012  .  POTS (postural orthostatic tachycardia syndrome) 08/16/2012  . Chest pain 06/26/2012  . Shortness of breath 06/26/2012  . Rapid heart beat 06/26/2012    Beacher May PT 10/18/2016, 1:03 PM  [facility]YS[facility] AND SPORTS MEDICINE [address], [place], [address] Phone: [phone]   Fax:  [phone]  Name: JANAT TABBERT MRN: [id number] Date of Birth: [birth year]

## 2016-10-22 ENCOUNTER — Ambulatory Visit: Payer: 59 | Admitting: Physical Therapy

## 2016-10-22 ENCOUNTER — Encounter: Payer: Self-pay | Admitting: Physical Therapy

## 2016-10-22 DIAGNOSIS — M25612 Stiffness of left shoulder, not elsewhere classified: Secondary | ICD-10-CM | POA: Diagnosis not present

## 2016-10-22 DIAGNOSIS — M62838 Other muscle spasm: Secondary | ICD-10-CM

## 2016-10-22 DIAGNOSIS — M6281 Muscle weakness (generalized): Secondary | ICD-10-CM | POA: Diagnosis not present

## 2016-10-22 DIAGNOSIS — M25512 Pain in left shoulder: Secondary | ICD-10-CM

## 2016-10-23 NOTE — Therapy (Signed)
[facility] [address] [place], [address] Phone: [phone]   Fax:  [phone]  Physical Therapy Treatment  Patient Details  Name: Claudia [name] MRN: [id number] Date of Birth: 12-19-1965 Referring Provider: Juanell Fairly, MD  Encounter Date: 10/22/2016      PT End of Session - 10/22/16 0940    Visit Number 40   Number of Visits 44   Date for PT Re-Evaluation 10/28/16   PT Start Time 0845   PT Stop Time 0944   PT Time Calculation (min) 59 min   Activity Tolerance Patient tolerated treatment well   Behavior During Therapy [facility] for tasks assessed/performed      Past Medical History:  Diagnosis Date  . Arthritis    knees, back;   . Asthma    H/O YEARS AGO-NO INHALERS  . Difficult intubation   . Dysrhythmia    H/O [facility] PROBLEMS SINCE 2013  . Rotator cuff tear    left    Past Surgical History:  Procedure Laterality Date  . CARPAL TUNNEL RELEASE     x2  . CHOLECYSTECTOMY    . COLONOSCOPY    . SHOULDER ARTHROSCOPY WITH OPEN ROTATOR CUFF REPAIR Left 10/31/2015   Procedure: SHOULDER ARTHROSCOPY, SUBACROMIAL DECOMPRESSION, BICEPS TENOTOMY AND MINI OPEN ROTATOR CUFF REPAIR;  Surgeon: Juanell Fairly, MD;  Location: [facility] ORS;  Service: Orthopedics;  Laterality: Left;  . TONSILLECTOMY      There were no vitals filed for this visit.      Subjective Assessment - 10/22/16 0845    Subjective Patient reports no problems since previous session. She continues to feel increased stiffness and has difficulty with hair care with raising left arm above shoulder level.   Limitations House hold activities;Other (comment);Lifting   Patient Stated Goals pain relief to  be able to care for self, hair care, use left UE for housework, lifting pots, pans   Currently in Pain? Yes   Pain Score 2    Pain Location Shoulder   Pain Orientation Left   Pain Descriptors / Indicators Discomfort   Pain Type  Chronic pain   Pain Onset More than a month ago   Pain Frequency Intermittent            Objective:        AROM: left shoulder pre treatment forward elevation 0-110; post treatment 0-115        Palpation: left shoulder; + moderate spasms palpable pectoral muscle, upper trapezius        Strength: left shoulder abduction 3/5, forward elevation 3/5, ER 3+/5      Treatment: Therapeutic exercise: patient performed exercises with verbal, tactile cues and demonstration of therapist: Warm up on pulleys x 5 min. With VC to perform with gradual ROM short arc, 3 positions repeat 3-5 sets Supine lying: AAROM with assist of therapist3x 10reps with VC and guided ROM to 150degrees flexion stretch feltat end range, ER/ IR through full ROM with mild stiffness at end range ER Rhythmic stabilizationUE at 90 degrees 3sets 10 reps CW/CCW with 2# weight in hand       Rhythmic stabilization: moderate manual resistance given for flexion/extension @ 90 degrees flexion 3 x 10; IR/ER in scapular plane 3 x 10 reps neutral position      Isometric abduction at 70 degrees and 30 degrees horizontal adduction: 2 x 10 reps 5 second holds Side lying: ER with towel between arm and trunk 2# weight 2 x  15 Abduction with assistance  x 10 reps with repositioning of shoulder Scapular retraction with manual resistance x 15 reps; moderate resistance with VC for correct technique Standing: Cable scapular row single arm left UE 7# 2 x 15 reps, with tactile cues for correct technique, muscle facilitation; right UE 10# 2 x 15 reps with good technique Straight arm pull down 15# x 10 reps Seated row bilateral UE's 10# x 15 reps  Manual therapy: STM left upper trapezius and anterior left shoulder, pectoral muscles superficial techniques with patient seated and supine: goal: pain, spasms  Modalities: Moist heat applied to left shoulder at end of session with patient seated x 10 min.: for pain, spasms:  no adverse skin  reactions noted      Patient response to treatment: Patient demonstrated improved AROM by 5 degrees and improved flexibility left shoulder forward elevation supine following STM/exercises. Improved motor control with repetition and VC for most                 exercises.       PT Education - 10/22/16 [phone]    Education provided Yes   Education Details HEP: add soft tissue mobilization to pectoral muscles left UE and progressive ROM with pulleys to avoid substitution of hiking left shoulder   Person(s) Educated Patient   Methods Explanation;Demonstration;Verbal cues   Comprehension Verbalized understanding;Returned demonstration;Verbal cues required             PT Long Term Goals - 09/16/16 0901      PT LONG TERM GOAL #1   Title Patient will be independent in home exercise program to improve strength/mobility for better functional independence with ADLs b 10/28/2016.    Baseline limited knowledge of appropriate pain control strategies, exercise progression for strength and ROM   Status Revised     PT LONG TERM GOAL #2   Title Patient will improved Quick DASH score to 20% or less impairment demonstrating reduced self-reported upper extremity disability by 10/28/2016.    Baseline Quick dash 70%; current 07/16/16 40% impairment; 09/16/16 40% impairment   Status Revised     PT LONG TERM GOAL #3   Title Patient will increase LUE shoulder AROM: sitting: Shoulder flexion >140 degrees, abduction: >120 degrees,  for increased functional ROM with ADLs such as grooming, dressing etc by 10/28/2016   Baseline 09/16/16 AROM forward elevation 110, limited AROM ER and IR 30% with pain   Status Revised     PT LONG TERM GOAL #4   Title Patient will report pain level max. of <2/10 with right UE use by 10/28/2016 to allow improved function with overhead activities/personal care   Baseline pain leve max. 7/10 with using right UE for daily activities; current max level 5/10   Status Revised                Plan - 10/22/16 0944    Clinical Impression Statement Patient continues to progress steadily towards goals with improving strength and endurance for functional activities at home. She continues with decresaed endurance and strength using left UE for use above shoulder level and will benefit from continued physical therapy intervention to achieve maximal function.    Rehab Potential Fair   PT Frequency 2x / week   PT Duration 6 weeks   PT Treatment/Interventions Iontophoresis 4mg /ml Dexamethasone;Electrical Stimulation;Cryotherapy;Moist Heat;Ultrasound;Patient/family education;Neuromuscular re-education;Therapeutic exercise;Manual techniques;Scar mobilization;Taping;Dry needling   PT Next Visit Plan  pain control, muslce re education, ther. ex for motor control, strength, manual STM  PT Home Exercise Plan scapular control, pain control with heat/TENS, isometric band exercises and scapular retraction band exercise, strengthening      Patient will benefit from skilled therapeutic intervention in order to improve the following deficits and impairments:  Impaired UE functional use, Increased muscle spasms, Decreased activity tolerance, Decreased knowledge of precautions, Pain, Impaired perceived functional ability, Decreased strength  Visit Diagnosis: Muscle weakness (generalized)  Stiffness of left shoulder, not elsewhere classified  Left shoulder pain, unspecified chronicity  Other muscle spasm     Problem List Patient Active Problem List   Diagnosis Date Noted  . Pulmonary hypertension 08/16/2012  . POTS (postural orthostatic tachycardia syndrome) 08/16/2012  . Chest pain 06/26/2012  . Shortness of breath 06/26/2012  . Rapid heart beat 06/26/2012    Beacher MayBrooks, Brittiny Levitz PT 10/23/2016, 7:52 AM  [facility] [address] [place], [name], [address] Phone: [phone]   Fax:  [phone]  Name: Claudia PilarLawana  A [name] MRN: [id number] Date of Birth: 1965-11-24

## 2016-10-24 ENCOUNTER — Encounter: Payer: Self-pay | Admitting: Physical Therapy

## 2016-10-24 ENCOUNTER — Ambulatory Visit: Payer: 59 | Attending: Orthopedic Surgery | Admitting: Physical Therapy

## 2016-10-24 DIAGNOSIS — M6281 Muscle weakness (generalized): Secondary | ICD-10-CM

## 2016-10-24 DIAGNOSIS — M25612 Stiffness of left shoulder, not elsewhere classified: Secondary | ICD-10-CM | POA: Diagnosis not present

## 2016-10-24 DIAGNOSIS — M62838 Other muscle spasm: Secondary | ICD-10-CM

## 2016-10-24 DIAGNOSIS — M25512 Pain in left shoulder: Secondary | ICD-10-CM | POA: Diagnosis not present

## 2016-10-24 DIAGNOSIS — M25529 Pain in unspecified elbow: Secondary | ICD-10-CM | POA: Diagnosis not present

## 2016-10-24 NOTE — Therapy (Signed)
[facility]TS [facility]INE [address] [place], [address] Phone: [phone]   Fax:  [phone]  Physical Therapy Treatment  Patient Details  Name: Claudia [name] MRN: [id number] Date of Birth: 1965-09-08 Referring Provider: Juanell Fairly, MD  Encounter Date: 10/24/2016      PT End of Session - 10/24/16 0935    Visit Number 41   Number of Visits 44   Date for PT Re-Evaluation 10/28/16   PT Start Time 0836   PT Stop Time 0930   PT Time Calculation (min) 54 min   Activity Tolerance Patient tolerated treatment well   Behavior During Therapy [facility] for tasks assessed/performed      Past Medical History:  Diagnosis Date  . Arthritis    knees, back;   . Asthma    H/O YEARS AGO-NO INHALERS  . Difficult intubation   . Dysrhythmia    H/O [facility] PROBLEMS SINCE 2013  . Rotator cuff tear    left    Past Surgical History:  Procedure Laterality Date  . CARPAL TUNNEL RELEASE     x2  . CHOLECYSTECTOMY    . COLONOSCOPY    . SHOULDER ARTHROSCOPY WITH OPEN ROTATOR CUFF REPAIR Left 10/31/2015   Procedure: SHOULDER ARTHROSCOPY, SUBACROMIAL DECOMPRESSION, BICEPS TENOTOMY AND MINI OPEN ROTATOR CUFF REPAIR;  Surgeon: Juanell Fairly, MD;  Location: [facility] ORS;  Service: Orthopedics;  Laterality: Left;  . TONSILLECTOMY      There were no vitals filed for this visit.      Subjective Assessment - 10/24/16 0837    Subjective Patient reports no problems since previous session. She is improving with endurance and using left UE and is trying to self massage left pectoral muscle as instructed. She reports she is not confident in performing exercises at home independently and feels she is still benefiting from physical therapy for decreasing spasms and progressing exercise.     Limitations House hold activities;Other (comment);Lifting   Patient Stated Goals pain relief to  be able to care for self, hair care, use left UE for  housework, lifting pots, pans   Currently in Pain? Yes   Pain Score 2    Pain Location Shoulder   Pain Orientation Left   Pain Descriptors / Indicators Discomfort   Pain Type Chronic pain   Pain Onset More than a month ago   Pain Frequency Intermittent              Objective:        AROM: left shoulder pre treatment forward elevation 0-118; post treatment 0-115        Palpation: left shoulder; + moderate spasms palpable pectoral muscle, upper trapezius             Treatment: Therapeutic exercise: patient performed exercises with verbal, tactile cues and demonstration of therapist: Supine lying: 5# bilateral flexion overhead x 15 AAROM with assist of therapist3x 10reps with VC and guided ROM to 150degrees flexion stretch feltat end range, ER/ IR through full ROM with mild stiffness at end range ER Rhythmic stabilizationUE at 90 degrees 3sets 10 reps CW/CCW with 2# weight in hand  Rhythmic stabilization: moderate manual resistance given for flexion/extension @ 90 degrees flexion 3 x 10; IR/ER in scapular plane 3 x 10 reps neutral position      Isometric abduction at 70 degrees and 30 degrees horizontal adduction: 2 x 10 reps 5 second holds Side lying: ER with towel between arm and trunk 3# weight  1 x 15 Abduction with assistance  x 10 reps with repositioning of shoulder Scapular retraction with manual resistance x 15 reps; moderate resistance with VC for correct technique Standing: Cable scapular row standing single arm left UE 10# 1 x 15 reps, with tactile cues for correct technique, muscle facilitation; right UE 10# 1 x 15 reps with good technique Straight arm pull down 15# x 10 reps Seated row bilateral UE's 15# x 15 reps  Manual therapy: STM left upper trapezius and anterior left shoulder, pectoral muscles superficial techniques with patient seated: goal: pain, spasms  Modalities: Moist heat applied to left shoulder at end of session with patient seated x 10  min.: for pain, spasms:  no adverse skin reactions noted   Patient response to treatment: Patient demonstrated improved technique with exercises with minimal VC and assistance of therapist. She was able to increase resistance with all exercises and increased repetitions with mild                    fatigue noted at end of session. No increased pain during session          PT Education - 10/24/16 0838    Education provided Yes   Education Details HEP: re assessed home program   Person(s) Educated Patient   Methods Explanation;Demonstration;Verbal cues   Comprehension Verbalized understanding;Returned demonstration;Verbal cues required             PT Long Term Goals - 09/16/16 0901      PT LONG TERM GOAL #1   Title Patient will be independent in home exercise program to improve strength/mobility for better functional independence with ADLs b 10/28/2016.    Baseline limited knowledge of appropriate pain control strategies, exercise progression for strength and ROM   Status Revised     PT LONG TERM GOAL #2   Title Patient will improved Quick DASH score to 20% or less impairment demonstrating reduced self-reported upper extremity disability by 10/28/2016.    Baseline Quick dash 70%; current 07/16/16 40% impairment; 09/16/16 40% impairment   Status Revised     PT LONG TERM GOAL #3   Title Patient will increase LUE shoulder AROM: sitting: Shoulder flexion >140 degrees, abduction: >120 degrees,  for increased functional ROM with ADLs such as grooming, dressing etc by 10/28/2016   Baseline 09/16/16 AROM forward elevation 110, limited AROM ER and IR 30% with pain   Status Revised     PT LONG TERM GOAL #4   Title Patient will report pain level max. of <2/10 with right UE use by 10/28/2016 to allow improved function with overhead activities/personal care   Baseline pain leve max. 7/10 with using right UE for daily activities; current max level 5/10   Status Revised                Plan - 10/24/16 0941    Clinical Impression Statement Patient demonstrates improvement in strength with increased resistance with exercises and improving endurance with increasing repetitions. She is still not confident in ability to perform independent home program due to continued modifications to exercises as symtpoms indicate. She will benefit from continued physical therapy intervention to achieve maximal gains and be able to transition to independent home program.    Rehab Potential Fair   PT Frequency 2x / week   PT Duration 6 weeks   PT Treatment/Interventions Iontophoresis 4mg /ml Dexamethasone;Electrical Stimulation;Cryotherapy;Moist Heat;Ultrasound;Patient/family education;Neuromuscular re-education;Therapeutic exercise;Manual techniques;Scar mobilization;Taping;Dry needling   PT Next Visit Plan  pain control, muslce  re education, ther. ex for motor control, strength, manual STM   PT Home Exercise Plan scapular control, pain control with heat/TENS, isometric band exercises and scapular retraction band exercise, strengthening      Patient will benefit from skilled therapeutic intervention in order to improve the following deficits and impairments:  Impaired UE functional use, Increased muscle spasms, Decreased activity tolerance, Decreased knowledge of precautions, Pain, Impaired perceived functional ability, Decreased strength  Visit Diagnosis: Muscle weakness (generalized)  Stiffness of left shoulder, not elsewhere classified  Left shoulder pain, unspecified chronicity  Other muscle spasm     Problem List Patient Active Problem List   Diagnosis Date Noted  . Pulmonary hypertension 08/16/2012  . POTS (postural orthostatic tachycardia syndrome) 08/16/2012  . Chest pain 06/26/2012  . Shortness of breath 06/26/2012  . Rapid heart beat 06/26/2012    Beacher MayBrooks, Ireta Pullman PT 10/24/2016, 10:04 AM  [facility] [address] [place], [name], [address] Phone: [phone]   Fax:  [phone]  Name: Claudia [name] MRN: [id number] Date of Birth: 1965-11-29

## 2016-10-29 ENCOUNTER — Ambulatory Visit: Payer: 59 | Admitting: Physical Therapy

## 2016-10-29 ENCOUNTER — Encounter: Payer: Self-pay | Admitting: Physical Therapy

## 2016-10-29 DIAGNOSIS — M6281 Muscle weakness (generalized): Secondary | ICD-10-CM | POA: Diagnosis not present

## 2016-10-29 DIAGNOSIS — M25612 Stiffness of left shoulder, not elsewhere classified: Secondary | ICD-10-CM | POA: Diagnosis not present

## 2016-10-29 DIAGNOSIS — M25512 Pain in left shoulder: Secondary | ICD-10-CM | POA: Diagnosis not present

## 2016-10-29 DIAGNOSIS — M62838 Other muscle spasm: Secondary | ICD-10-CM

## 2016-10-30 NOTE — Therapy (Signed)
[place] [place] [facility] [address] [place], [address] Phone: [phone]   Fax:  [phone]  Physical Therapy Treatment  Patient Details  Name: Claudia [name] MRN: [id number] Date of Birth: 1966/03/12 Referring Provider: Juanell Fairly, MD  Encounter Date: 10/29/2016      PT End of Session - 10/29/16 0900    Visit Number 42   Number of Visits 52   Date for PT Re-Evaluation 11/26/16   PT Start Time 0750   PT Stop Time 0840   PT Time Calculation (min) 50 min   Activity Tolerance Patient tolerated treatment well   Behavior During Therapy [facility] for tasks assessed/performed      Past Medical History:  Diagnosis Date  . Arthritis    knees, back;   . Asthma    H/O YEARS AGO-NO INHALERS  . Difficult intubation   . Dysrhythmia    H/O [facility] PROBLEMS SINCE 2013  . Rotator cuff tear    left    Past Surgical History:  Procedure Laterality Date  . CARPAL TUNNEL RELEASE     x2  . CHOLECYSTECTOMY    . COLONOSCOPY    . SHOULDER ARTHROSCOPY WITH OPEN ROTATOR CUFF REPAIR Left 10/31/2015   Procedure: SHOULDER ARTHROSCOPY, SUBACROMIAL DECOMPRESSION, BICEPS TENOTOMY AND MINI OPEN ROTATOR CUFF REPAIR;  Surgeon: Juanell Fairly, MD;  Location: [facility] ORS;  Service: Orthopedics;  Laterality: Left;  . TONSILLECTOMY      There were no vitals filed for this visit.      Subjective Assessment - 10/29/16 0811    Subjective Patient reports she is doing better and continues with stiffness in left shouder with raising arm above shoulder level. She feels she is getting stronger with therapy.   Limitations House hold activities;Other (comment);Lifting   Patient Stated Goals pain relief to  be able to care for self, hair care, use left UE for housework, lifting pots, pans   Currently in Pain? No/denies   Pain Onset --             Objective: AROM: left shoulder pre treatment forward elevation 0-115; post treatment  0-125 Palpation: left shoulder; + moderate spasms palpable pectoral muscle, upper trapezius  Treatment: Therapeutic Exercise: Patient performed exercises with verbal, tactile cues and demonstration of therapist as needed Seated:  Pulleys for warm up with gradual increased ROM sets of 10 through partial ROM to full ROM Cable exercises: Seated scapular rows bilateral 10# 2 x 15 Standing scapular rows single arm right and left 7# x 15 reps Standing straight arm pull downs x 15 with 17#, second set 15# x 10 Supine lying: Forward elevation with 5# weight bilateral x 15 for controlled motion, ROM AAROM forward elevation, abduction, rotations x 10 each with stretch at end range Rhythmic stabilization left shoulder @ 90 degrees elevation, neutral rotation in plane of scapula 2 x 10 reps each Isometric abduction at 70 degrees in plane of scapula x 10 reps 3 second holds Side lying on right: Left shoulder ER with towel between arm and trunk: 3# weight x 15 reps Left shoulder abduction with assistance 2 x 10  Manual therapy: With patient seated and supine lying in conjunction with exercises: STM performed to left shoulder; pectoral, subscapularis, upper trapezius muscles with superficial and deep techniques: goal: improve spasms, ROM with decreased pain  Modalities: Moist heat applied to left shoulder at edn of session with patient seated x 12 min. : goal: pain, spasms: no adverse skin  reactions noted  Patient response to treatment: patient demonstrated improved technique with exercises with minimal VC for correct alignment. Patient reported mild stretching symptoms with exercises, no pain in right upper arm.  Patient with decreased spasms by 50% following STM. Improved motor control with repetition and cuing.            PT Education - 10/29/16 0900    Education provided Yes   Education Details HEP: continue to work on scapular retraction and not hiking shoulder with exercises    Person(s) Educated Patient   Methods Explanation;Verbal cues;Demonstration   Comprehension Verbalized understanding;Returned demonstration;Verbal cues required             PT Long Term Goals - 10/29/16 0844      PT LONG TERM GOAL #1   Title Patient will be independent in home exercise program to improve strength/mobility for better functional independence with ADLs b 11/26/2016.    Baseline limited knowledge of appropriate pain control strategies, exercise progression for strength and ROM for advancing exercises   Status Revised     PT LONG TERM GOAL #2   Title Patient will improved Quick DASH score to 20% or less impairment demonstrating reduced self-reported upper extremity disability by 11/26/2016.    Baseline Quick dash 70%; current 07/16/16 40% impairment; 09/16/16 40% impairment; 10/29/16 38%   Status Revised     PT LONG TERM GOAL #3   Title Patient will increase LUE shoulder AROM: sitting: Shoulder flexion >140 degrees, abduction: >120 degrees,  for increased functional ROM with ADLs such as grooming, dressing etc by 11/26/2016   Baseline 09/16/16 AROM forward elevation 110, 10/29/2016 forward elevation to 120/125 inconsistently; decreased pain to mild; still unable to perform hair care without difficulty   Status Revised     PT LONG TERM GOAL #4   Title Patient will report pain level max. of <2/10 with right UE use by 11/26/2016 to allow improved function with overhead activities/personal care   Baseline pain leve max. 7/10 with using right UE for daily activities; current max level 5/10; 10/29/16 maximal pain level 3/10   Status Revised               Plan - 10/29/16 0840    Clinical Impression Statement Patient demonstrates improvement in strength left shoulder/periscapular muscles and is beginning to progress with incresaed intensity, endurance with more controlled movement. Her pain and stiffness are steadily improving as well and she is improving function with activities at or  below shoulder level. She has decreased strength in left shoulder ER, flexion and abduction and is improving with current physical therapy intervention. She should continue to progress with additional PT intervention to achieve maximal gains and be able to transition to independent home program.    Rehab Potential Fair   Clinical Impairments Affecting Rehab Potential (+) age, motivated (-)chronic pain right shoulder following surgery 10/2015; RTC tear post surgery   PT Frequency 2x / week   PT Duration 4 weeks   PT Treatment/Interventions Iontophoresis 4mg /ml Dexamethasone;Electrical Stimulation;Cryotherapy;Moist Heat;Ultrasound;Patient/family education;Neuromuscular re-education;Therapeutic exercise;Manual techniques;Scar mobilization;Taping;Dry needling   PT Next Visit Plan  pain control, muslce re education, ther. ex for motor control, strength, manual STM   PT Home Exercise Plan scapular control, pain control with heat/TENS, isometric band exercises and scapular retraction band exercise, strengthening   Consulted and Agree with Plan of Care Patient      Patient will benefit from skilled therapeutic intervention in order to improve the following deficits and impairments:  Impaired  UE functional use, Increased muscle spasms, Decreased activity tolerance, Decreased knowledge of precautions, Pain, Impaired perceived functional ability, Decreased strength  Visit Diagnosis: Muscle weakness (generalized) - Plan: PT plan of care cert/re-cert  Stiffness of left shoulder, not elsewhere classified - Plan: PT plan of care cert/re-cert  Other muscle spasm - Plan: PT plan of care cert/re-cert  Left shoulder pain, unspecified chronicity - Plan: PT plan of care cert/re-cert     Problem List Patient Active Problem List   Diagnosis Date Noted  . Pulmonary hypertension 08/16/2012  . POTS (postural orthostatic tachycardia syndrome) 08/16/2012  . Chest pain 06/26/2012  . Shortness of breath 06/26/2012   . Rapid heart beat 06/26/2012    Beacher May PT 10/30/2016, 11:54 AM  [facility] PHYSICAL AND SPORTS MEDICINE [address] [place], [address] Phone: [phone]   Fax:  [phone]  Name: Claudia [name] MRN: [id number] Date of Birth: 1966-05-09

## 2016-11-01 ENCOUNTER — Ambulatory Visit: Payer: 59 | Admitting: Physical Therapy

## 2016-11-01 ENCOUNTER — Encounter: Payer: Self-pay | Admitting: Physical Therapy

## 2016-11-01 DIAGNOSIS — M25512 Pain in left shoulder: Secondary | ICD-10-CM | POA: Diagnosis not present

## 2016-11-01 DIAGNOSIS — M25612 Stiffness of left shoulder, not elsewhere classified: Secondary | ICD-10-CM

## 2016-11-01 DIAGNOSIS — M62838 Other muscle spasm: Secondary | ICD-10-CM

## 2016-11-01 DIAGNOSIS — M6281 Muscle weakness (generalized): Secondary | ICD-10-CM | POA: Diagnosis not present

## 2016-11-01 NOTE — Therapy (Signed)
[place]TS [facility] [address] [place], [address] Phone: [phone]   Fax:  [phone]  Physical Therapy Treatment  Patient Details  Name: Claudia [name] MRN: [id number] Date of Birth: 31-Jul-1966 Referring Provider: Juanell Fairly, MD  Encounter Date: 11/01/2016      PT End of Session - 11/01/16 0807    Visit Number 43   Number of Visits 52   Date for PT Re-Evaluation 11/26/16   PT Start Time 0803   PT Stop Time 0857   PT Time Calculation (min) 54 min   Activity Tolerance Patient tolerated treatment well   Behavior During Therapy [facility] for tasks assessed/performed      Past Medical History:  Diagnosis Date  . Arthritis    knees, back;   . Asthma    H/O YEARS AGO-NO INHALERS  . Difficult intubation   . Dysrhythmia    H/O [facility] PROBLEMS SINCE 2013  . Rotator cuff tear    left    Past Surgical History:  Procedure Laterality Date  . CARPAL TUNNEL RELEASE     x2  . CHOLECYSTECTOMY    . COLONOSCOPY    . SHOULDER ARTHROSCOPY WITH OPEN ROTATOR CUFF REPAIR Left 10/31/2015   Procedure: SHOULDER ARTHROSCOPY, SUBACROMIAL DECOMPRESSION, BICEPS TENOTOMY AND MINI OPEN ROTATOR CUFF REPAIR;  Surgeon: Juanell Fairly, MD;  Location: [facility] ORS;  Service: Orthopedics;  Laterality: Left;  . TONSILLECTOMY      There were no vitals filed for this visit.      Subjective Assessment - 11/01/16 0806    Subjective Patient feels about the same as last visit. She is seeing slow and steady improvements with ROM and strength.    Limitations House hold activities;Other (comment);Lifting   Patient Stated Goals pain relief to  be able to care for self, hair care, use left UE for housework, lifting pots, pans   Currently in Pain? No/denies  in the mornings stiff and 1/10 better following meloxicam         Objective:     AROM: left shoulder forward elevation pre treatment 0-105     Palpation: + spasms palpable  posterior aspect left shoulder, teres muscles, subscapularis muscles   Treatment: Therapeutic Exercise: Patient performed exercises with verbal, tactile cues and demonstration of therapist as needed Seated:  Pulleys for stretching in between exercises with gradual increased ROM sets of 10 through partial ROM to full ROM Cable exercises: Seated scapular rows bilateral 13#  x 15 Standing scapular rows single arm right and left 10# x 15 reps Standing straight arm pull downs  2 x 10 with 18#  Chest press through short arc 5# 2 x 15 alternating with reverse chin up seated 18 and 20 # x 15 reps each Supine lying: Forward elevation with 5# weight bilateral x 15 for controlled motion, ROM AAROM forward elevation, abduction, rotations x 10 each with stretch at end range Rhythmic stabilization left shoulder @ 90 degrees elevation, neutral rotation in plane of scapula 2 x 10 reps each Isometric abduction at 70 degrees in plane of scapula x 10 reps 3 second holds Side lying on right: Left shoulder ER with towel between arm and trunk: 3# weight 2 x 10 reps Left shoulder abduction with manual resistance mild  2 x 10  Manual therapy: With patient seated and supine lying in conjunction with exercises: STM performed to left shoulder; pectoral, subscapularis, upper trapezius muscles with superficial and deep techniques: goal: improve spasms, ROM  with decreased pain  Modalities: Moist heat applied to left shoulder and elbow at end of session with patient seated x 10 min. : goal: pain, spasms: no adverse skin reactions noted  Patient response to treatment: patient demonstrated improved technique with exercises with repetition and minimal VC of therapist. Patient reported decreased pulling under arm following STM. She continued with increased left elbow pain with chest press. Decreased spasms and improved ROM left shoulder following STM.              PT Education - 11/01/16 0900    Education  provided Yes   Education Details Re assessed home exercises with concentration on scapular control during all exercises; use of heat for pain control for left UE shoulder and elbow   Person(s) Educated Patient   Methods Explanation;Verbal cues;Demonstration;Tactile cues   Comprehension Verbalized understanding;Returned demonstration;Verbal cues required;Tactile cues required             PT Long Term Goals - 10/29/16 0844      PT LONG TERM GOAL #1   Title Patient will be independent in home exercise program to improve strength/mobility for better functional independence with ADLs b 11/26/2016.    Baseline limited knowledge of appropriate pain control strategies, exercise progression for strength and ROM for advancing exercises   Status Revised     PT LONG TERM GOAL #2   Title Patient will improved Quick DASH score to 20% or less impairment demonstrating reduced self-reported upper extremity disability by 11/26/2016.    Baseline Quick dash 70%; current 07/16/16 40% impairment; 09/16/16 40% impairment; 10/29/16 38%   Status Revised     PT LONG TERM GOAL #3   Title Patient will increase LUE shoulder AROM: sitting: Shoulder flexion >140 degrees, abduction: >120 degrees,  for increased functional ROM with ADLs such as grooming, dressing etc by 11/26/2016   Baseline 09/16/16 AROM forward elevation 110, 10/29/2016 forward elevation to 120/125 inconsistently; decreased pain to mild; still unable to perform hair care without difficulty   Status Revised     PT LONG TERM GOAL #4   Title Patient will report pain level max. of <2/10 with right UE use by 11/26/2016 to allow improved function with overhead activities/personal care   Baseline pain leve max. 7/10 with using right UE for daily activities; current max level 5/10; 10/29/16 maximal pain level 3/10   Status Revised               Plan - 11/01/16 0851    Clinical Impression Statement Patient demonstrates improvement with strength left shoulder  and periscapular muscles and improving flexibility in left shoulder for functional use of left UE. She continues to require physical therapy intervention to address strength and ROM deficits in order to achieve goals and maximal functional use of left UE.    Rehab Potential Fair   Clinical Impairments Affecting Rehab Potential (+) age, motivated (-)chronic pain right shoulder following surgery 10/2015; RTC tear post surgery   PT Frequency 2x / week   PT Duration 4 weeks   PT Treatment/Interventions Iontophoresis 4mg /ml Dexamethasone;Electrical Stimulation;Cryotherapy;Moist Heat;Ultrasound;Patient/family education;Neuromuscular re-education;Therapeutic exercise;Manual techniques;Scar mobilization;Taping;Dry needling   PT Next Visit Plan  pain control, muslce re education, ther. ex for motor control, strength, manual STM   PT Home Exercise Plan scapular control, pain control with heat/TENS, isometric band exercises and scapular retraction band exercise, strengthening   Consulted and Agree with Plan of Care Patient      Patient will benefit from skilled therapeutic intervention in order  to improve the following deficits and impairments:  Impaired UE functional use, Increased muscle spasms, Decreased activity tolerance, Decreased knowledge of precautions, Pain, Impaired perceived functional ability, Decreased strength  Visit Diagnosis: Muscle weakness (generalized)  Stiffness of left shoulder, not elsewhere classified  Other muscle spasm  Left shoulder pain, unspecified chronicity     Problem List Patient Active Problem List   Diagnosis Date Noted  . Pulmonary hypertension 08/16/2012  . POTS (postural orthostatic tachycardia syndrome) 08/16/2012  . Chest pain 06/26/2012  . Shortness of breath 06/26/2012  . Rapid heart beat 06/26/2012    Beacher May PT 11/01/2016, 7:26 PM  [place] [facility] PHYSICAL AND SPORTS MEDICINE [address] [place],  [address] Phone: [phone]   Fax:  [phone]  Name: Claudia [name] MRN: [id number] Date of Birth: 1966-05-26

## 2016-11-06 ENCOUNTER — Encounter: Payer: Self-pay | Admitting: Physical Therapy

## 2016-11-06 ENCOUNTER — Ambulatory Visit: Payer: 59 | Admitting: Physical Therapy

## 2016-11-06 DIAGNOSIS — M25512 Pain in left shoulder: Secondary | ICD-10-CM

## 2016-11-06 DIAGNOSIS — M62838 Other muscle spasm: Secondary | ICD-10-CM

## 2016-11-06 DIAGNOSIS — M6281 Muscle weakness (generalized): Secondary | ICD-10-CM

## 2016-11-06 DIAGNOSIS — M25612 Stiffness of left shoulder, not elsewhere classified: Secondary | ICD-10-CM | POA: Diagnosis not present

## 2016-11-07 NOTE — Therapy (Signed)
[place] [facility] [address] [place], [name], [address] Phone: [phone]   Fax:  [phone]  Physical Therapy Treatment  Patient Details  Name: Claudia [name] MRN: [id number] Date of Birth: 1966-02-21 Referring Provider: Juanell FairlyKrasinski, Kevin, MD  Encounter Date: 11/06/2016      PT End of Session - 11/06/16 0856    Visit Number 44   Number of Visits 52   Date for PT Re-Evaluation 11/26/16   PT Start Time 0851   PT Stop Time 0927   PT Time Calculation (min) 36 min   Activity Tolerance Patient tolerated treatment well   Behavior [facility] for tasks assessed/performed      Past Medical History:  Diagnosis Date  . Arthritis    knees, back;   . Asthma    H/O YEARS AGO-NO INHALERS  . Difficult intubation   . [facility]ia    [facility]  . Rotator cuff tear    left    Past Surgical History:  Procedure Laterality Date  . CARPAL TUNNEL RELEASE     x2  . CHOLECYSTECTOMY    . COLONOSCOPY    . SHOULDER ARTHROSCOPY WITH OPEN ROTATOR CUFF REPAIR Left 10/31/2015   Procedure: SHOULDER ARTHROSCOPY, SUBACROMIAL DECOMPRESSION, BICEPS TENOTOMY AND MINI OPEN ROTATOR CUFF REPAIR;  Surgeon: Juanell FairlyKevin Krasinski, MD;  Location: [facility] ORS;  Service: Orthopedics;  Laterality: Left;  . TONSILLECTOMY      There were no vitals filed for this visit.      Subjective Assessment - 11/06/16 0855    Subjective Patient feels about the same as last visit. She is seeing slow and steady improvements with ROM and strength.    Limitations House hold activities;Other (comment);Lifting   Patient Stated Goals pain relief to  be able to care for self, hair care, use left UE for housework, lifting pots, pans   Currently in Pain? No/denies  soreness with movement          Objective:     AROM: left shoulder forward elevation pre treatment 0-110     Palpation: + mild spasms palpable posterior aspect left shoulder,  teres muscles, subscapularis muscles   Treatment: Therapeutic Exercise: Patient performed exercises with verbal, tactile cues and demonstration of therapist as needed Cable exercises: Seated scapular rows bilateral 15#  x 15 Standing scapular rows single arm right and left 10# x 15 reps Standing straight arm pull downs  2 x 10 with 15#  Reverse chin ups 20# 2 x 15 reps Standing as wall; shoulder scaption and ER x 10 each with cuing for correct alignment and controlled motion of shoulder to decreased substitution Supine lying: Forward elevation with 5# weight bilateral x 15 for controlled motion, ROM AAROM forward elevation, abduction, rotations x 10 each with stretch at end range Rhythmic stabilization left shoulder @ 90 degrees elevation, neutral rotation in plane of scapula 2 x 10 reps each  Side lying on right: Left shoulder ER with towel between arm and trunk: 3# weight 2 x 10 reps Left shoulder abduction with manual resistance mild  2 x 10  Patient response to treatment: patient demonstrated improved technique with exercises with minimal VC for correct alignment; she was limited in cable exercises for chest press and scapular rows due to left elbow pain.  Improved motor control with repetition and cuing.        PT Education - 11/06/16 1000    Education provided Yes   Education  Details HEP: continue to work on strengthenign in supine lying, side lying and scapular retraction.   Person(s) Educated Patient   Methods Explanation;Demonstration;Verbal cues   Comprehension Verbalized understanding;Returned demonstration;Verbal cues required             PT Long Term Goals - 10/29/16 0844      PT LONG TERM GOAL #1   Title Patient will be independent in home exercise program to improve strength/mobility for better functional independence with ADLs b 11/26/2016.    Baseline limited knowledge of appropriate pain control strategies, exercise progression for strength and ROM for  advancing exercises   Status Revised     PT LONG TERM GOAL #2   Title Patient will improved Quick DASH score to 20% or less impairment demonstrating reduced self-reported upper extremity disability by 11/26/2016.    Baseline Quick dash 70%; current 07/16/16 40% impairment; 09/16/16 40% impairment; 10/29/16 38%   Status Revised     PT LONG TERM GOAL #3   Title Patient will increase LUE shoulder AROM: sitting: Shoulder flexion >140 degrees, abduction: >120 degrees,  for increased functional ROM with ADLs such as grooming, dressing etc by 11/26/2016   Baseline 09/16/16 AROM forward elevation 110, 10/29/2016 forward elevation to 120/125 inconsistently; decreased pain to mild; still unable to perform hair care without difficulty   Status Revised     PT LONG TERM GOAL #4   Title Patient will report pain level max. of <2/10 with right UE use by 11/26/2016 to allow improved function with overhead activities/personal care   Baseline pain leve max. 7/10 with using right UE for daily activities; current max level 5/10; 10/29/16 maximal pain level 3/10   Status Revised               Plan - 11/06/16 0856    Clinical Impression Statement Patient demonstrates improvement with strength left shoulder as demonstrated with progression with increased resistance during exercises with less effort and improving control and decreased substitutions with left shoulder. She continues with limited function with above shoulder level and rotations using left UE and will benefit from continued physical therapy intervention to further strengthen left UE to achieve maximal function and be able to transition to independent home program.   Rehab Potential Fair   Clinical Impairments Affecting Rehab Potential (+) age, motivated (-)chronic pain right shoulder following surgery 10/2015; RTC tear post surgery   PT Frequency 2x / week   PT Duration 4 weeks   PT Treatment/Interventions Iontophoresis 4mg /ml Dexamethasone;Electrical  Stimulation;Cryotherapy;Moist Heat;Ultrasound;Patient/family education;Neuromuscular re-education;Therapeutic exercise;Manual techniques;Scar mobilization;Taping;Dry needling   PT Next Visit Plan  pain control, muslce re education, ther. ex for motor control, strength, manual STM   PT Home Exercise Plan scapular control, pain control with heat/TENS, isometric band exercises and scapular retraction band exercise, strengthening   Consulted and Agree with Plan of Care Patient      Patient will benefit from skilled therapeutic intervention in order to improve the following deficits and impairments:  Impaired UE functional use, Increased muscle spasms, Decreased activity tolerance, Decreased knowledge of precautions, Pain, Impaired perceived functional ability, Decreased strength  Visit Diagnosis: Muscle weakness (generalized)  Stiffness of left shoulder, not elsewhere classified  Other muscle spasm  Left shoulder pain, unspecified chronicity     Problem List Patient Active Problem List   Diagnosis Date Noted  . Pulmonary hypertension 08/16/2012  . POTS (postural orthostatic tachycardia syndrome) 08/16/2012  . Chest pain 06/26/2012  . Shortness of breath 06/26/2012  . Rapid heart beat  06/26/2012    Beacher May PT 11/07/2016, 2:19 PM  [place] [facility] PH[facility]ORTS MEDICINE [address] [place], [address] Phone: [phone]   Fax:  [phone]  Name: Claudia [name] MRN: [id number] Date of Birth: 12/07/1965

## 2016-11-08 ENCOUNTER — Ambulatory Visit: Payer: 59 | Admitting: Physical Therapy

## 2016-11-08 ENCOUNTER — Encounter: Payer: Self-pay | Admitting: Physical Therapy

## 2016-11-08 DIAGNOSIS — M25612 Stiffness of left shoulder, not elsewhere classified: Secondary | ICD-10-CM | POA: Diagnosis not present

## 2016-11-08 DIAGNOSIS — M6281 Muscle weakness (generalized): Secondary | ICD-10-CM | POA: Diagnosis not present

## 2016-11-08 DIAGNOSIS — M25512 Pain in left shoulder: Secondary | ICD-10-CM

## 2016-11-08 DIAGNOSIS — M62838 Other muscle spasm: Secondary | ICD-10-CM | POA: Diagnosis not present

## 2016-11-08 NOTE — Therapy (Signed)
[facility] [address], [place], [address] Phone: [phone]   Fax:  [phone]  Physical Therapy Treatment  Patient Details  Name: Claudia [name] MRN: [id number] Date of Birth: [birth year] Referring Provider: Juanell Fairly, MD  Encounter Date: 11/08/2016      PT End of Session - 11/08/16 0807    Visit Number 45   Number of Visits 52   Date for PT Re-Evaluation 11/26/16   PT Start Time 0800   PT Stop Time 0858   PT Time Calculation (min) 58 min   Activity Tolerance Patient tolerated treatment well   Behavior During Therapy [facility] for tasks assessed/performed      Past Medical History:  Diagnosis Date  . Arthritis    knees, back;   . Asthma    H/O YEARS AGO-NO INHALERS  . Difficult intubation   . Dysrhythmia    H/O [facility] PROBLEMS SINCE 2013  . Rotator cuff tear    left    Past Surgical History:  Procedure Laterality Date  . CARPAL TUNNEL RELEASE     x2  . CHOLECYSTECTOMY    . COLONOSCOPY    . SHOULDER ARTHROSCOPY WITH OPEN ROTATOR CUFF REPAIR Left 10/31/2015   Procedure: SHOULDER ARTHROSCOPY, SUBACROMIAL DECOMPRESSION, BICEPS TENOTOMY AND MINI OPEN ROTATOR CUFF REPAIR;  Surgeon: Juanell Fairly, MD;  Location: [facility] ORS;  Service: Orthopedics;  Laterality: Left;  . TONSILLECTOMY      There were no vitals filed for this visit.      Subjective Assessment - 11/08/16 0805    Subjective Patient reports increased soreness following previous visit.    Limitations House hold activities;Other (comment);Lifting   Patient Stated Goals pain relief to  be able to care for self, hair care, use left UE for housework, lifting pots, pans   Currently in Pain? No/denies  soreness          Objective: AROM: left shoulder forward elevation pre treatment 0-120 Palpation: + mild spasms palpable posterior aspect left shoulder, teres muscles  Treatment: Manual therapy: With patient  seated and supine lying in conjunction with exercises: STM performed to left shoulder; pectoral, subscapularis, upper trapezius muscles with superficial and deep techniques: goal: improve spasms, ROM with decreased pain  Therapeutic Exercise: Patient performed exercises with verbal, tactile cues and demonstration of therapist as needed Cable exercises: Seated scapular rows bilateral 15# x 10 Standing  bent over scapular rows single arm right and left 5# x 15 reps Standing straight arm pull downs 2 x 10with 15#  Reverse chin ups 20#  2 x 15 reps Standing as wall; shoulder scaption and ER x 10 each with cuing for correct alignment and controlled motion of shoulder to decreased substitution ER bilateral UE's x 15 reps with demonstration Supine lying: AAROM forward elevation, abduction, rotations x 10 each with stretch at end range Rhythmic stabilization left shoulder @ 90 degrees elevation, neutral rotation in plane of scapula 2 x 10 reps each  Side lying on right: Left shoulder ER with towel between arm and trunk: 3# weight 2 x 10reps Left shoulder abduction with manual resistance mild 2 x 10  Modalities: Moist heat applied to left shoulder at end of session with patient seated x 12 min: goal: pain, spasms: no adverse skin reactions noted  Patient response to treatment: Patient demonstrated improved motor control with repetition and improved technique with VC for correct alignment. No increased pain reported throughout session. Decreased soreness with STM.  PT Education - 11/08/16 0806    Education provided Yes   Education Details HEP: continue to work on side lying exercises and standing at wall for scaption and ER   Person(s) Educated Patient   Methods Explanation   Comprehension Verbalized understanding             PT Long Term Goals - 10/29/16 0844      PT LONG TERM GOAL #1   Title Patient will be independent in home exercise program to improve  strength/mobility for better functional independence with ADLs b 11/26/2016.    Baseline limited knowledge of appropriate pain control strategies, exercise progression for strength and ROM for advancing exercises   Status Revised     PT LONG TERM GOAL #2   Title Patient will improved Quick DASH score to 20% or less impairment demonstrating reduced self-reported upper extremity disability by 11/26/2016.    Baseline Quick dash 70%; current 07/16/16 40% impairment; 09/16/16 40% impairment; 10/29/16 38%   Status Revised     PT LONG TERM GOAL #3   Title Patient will increase LUE shoulder AROM: sitting: Shoulder flexion >140 degrees, abduction: >120 degrees,  for increased functional ROM with ADLs such as grooming, dressing etc by 11/26/2016   Baseline 09/16/16 AROM forward elevation 110, 10/29/2016 forward elevation to 120/125 inconsistently; decreased pain to mild; still unable to perform hair care without difficulty   Status Revised     PT LONG TERM GOAL #4   Title Patient will report pain level max. of <2/10 with right UE use by 11/26/2016 to allow improved function with overhead activities/personal care   Baseline pain leve max. 7/10 with using right UE for daily activities; current max level 5/10; 10/29/16 maximal pain level 3/10   Status Revised               Plan - 11/08/16 0807    Clinical Impression Statement Patient demonstrates improving strength and ROM in left UE and is using left UE with less difficulty for hair care. She continues with decreased strength and endurance in left UE and requires guidance for exercises and will therefore benefit from additional physical therapy intervention to achieve goals.    Rehab Potential Fair   Clinical Impairments Affecting Rehab Potential (+) age, motivated (-)chronic pain right shoulder following surgery 10/2015; RTC tear post surgery   PT Frequency 2x / week   PT Duration 4 weeks   PT Treatment/Interventions Iontophoresis 4mg /ml  Dexamethasone;Electrical Stimulation;Cryotherapy;Moist Heat;Ultrasound;Patient/family education;Neuromuscular re-education;Therapeutic exercise;Manual techniques;Scar mobilization;Taping;Dry needling   PT Next Visit Plan  pain control, muslce re education, ther. ex for motor control, strength, manual STM   PT Home Exercise Plan scapular control, pain control with heat/TENS, isometric band exercises and scapular retraction band exercise, strengthening   Consulted and Agree with Plan of Care Patient      Patient will benefit from skilled therapeutic intervention in order to improve the following deficits and impairments:  Impaired UE functional use, Increased muscle spasms, Decreased activity tolerance, Decreased knowledge of precautions, Pain, Impaired perceived functional ability, Decreased strength  Visit Diagnosis: Muscle weakness (generalized)  Stiffness of left shoulder, not elsewhere classified  Other muscle spasm  Left shoulder pain, unspecified chronicity     Problem List Patient Active Problem List   Diagnosis Date Noted  . Pulmonary hypertension 08/16/2012  . POTS (postural orthostatic tachycardia syndrome) 08/16/2012  . Chest pain 06/26/2012  . Shortness of breath 06/26/2012  . Rapid heart beat 06/26/2012    Beacher May PT 11/08/2016,  4:50 PM  [facility] [address] [place], [name], [id number] Phone: [phone]   Fax:  [phone]  Name: Claudia [name] MRN: [id number] Date of Birth: 1966/04/08

## 2016-11-11 ENCOUNTER — Ambulatory Visit: Payer: 59 | Admitting: Physical Therapy

## 2016-11-11 ENCOUNTER — Encounter: Payer: Self-pay | Admitting: Physical Therapy

## 2016-11-11 DIAGNOSIS — M25612 Stiffness of left shoulder, not elsewhere classified: Secondary | ICD-10-CM | POA: Diagnosis not present

## 2016-11-11 DIAGNOSIS — M25512 Pain in left shoulder: Secondary | ICD-10-CM

## 2016-11-11 DIAGNOSIS — M62838 Other muscle spasm: Secondary | ICD-10-CM

## 2016-11-11 DIAGNOSIS — M6281 Muscle weakness (generalized): Secondary | ICD-10-CM | POA: Diagnosis not present

## 2016-11-11 NOTE — Therapy (Signed)
[place] [facility] [address], [place], [address] Phone: [phone]   Fax:  [phone]  Physical Therapy Treatment  Patient Details  Name: Claudia [name] MRN: [id number] Date of Birth: 04-13-1966 Referring Provider: Juanell Fairly, MD  Encounter Date: 11/11/2016      PT End of Session - 11/11/16 1213    Visit Number 46   Number of Visits 52   Date for PT Re-Evaluation 11/26/16   PT Start Time 1210   PT Stop Time 1258   PT Time Calculation (min) 48 min   Activity Tolerance Patient tolerated treatment well   Behavior During Therapy [facility] for tasks assessed/performed      Past Medical History:  Diagnosis Date  . Arthritis    knees, back;   . Asthma    H/O YEARS AGO-NO INHALERS  . Difficult intubation   . Dysrhythmia    H/O [facility] PROBLEMS SINCE 2013  . Rotator cuff tear    left    Past Surgical History:  Procedure Laterality Date  . CARPAL TUNNEL RELEASE     x2  . CHOLECYSTECTOMY    . COLONOSCOPY    . SHOULDER ARTHROSCOPY WITH OPEN ROTATOR CUFF REPAIR Left 10/31/2015   Procedure: SHOULDER ARTHROSCOPY, SUBACROMIAL DECOMPRESSION, BICEPS TENOTOMY AND MINI OPEN ROTATOR CUFF REPAIR;  Surgeon: Juanell Fairly, MD;  Location: [facility] ORS;  Service: Orthopedics;  Laterality: Left;  . TONSILLECTOMY      There were no vitals filed for this visit.      Subjective Assessment - 11/11/16 1212    Subjective Patient reports she is doing better with less pain in left shoulder and reports stiffness and weakness as limitations to function with hair care and using left UE for daily tasks.   Limitations House hold activities;Other (comment);Lifting   Patient Stated Goals pain relief to  be able to care for self, hair care, use left UE for housework, lifting pots, pans   Currently in Pain? No/denies  soreness only left shoulder          Objective: AROM: left shoulder forward elevation pre treatment  0-120 Palpation: + mild spasms palpable posterior aspect left shoulder, teres muscles  Treatment: Manual therapy: With patient seated and supine lying in conjunction with exercises: STM performed to left shoulder; pectoral, subscapularis, upper trapezius muscles with superficial and deep techniques: goal: improve spasms, ROM with decreased pain  Therapeutic Exercise: Patient performed exercises with verbal, tactile cues and demonstration of therapist as needed Cable exercises: Seated scapular rows bilateral 10# x 15, 15# x 10 Standing  bent over scapular rows single arm right and left 5# x 15 reps Standing straight arm pull downs 2 x 10with 15#  Reverse chin ups 20#  2 x 15 reps Standing at wall; shoulder scaption and ER x 10 each with cuing for correct alignment and controlled motion of shoulder to decreased substitution ER bilateral UE's x 15 reps with demonstration Supine lying: AAROM forward elevation, abduction, rotations x 10 each with stretch at end range Rhythmic stabilization left shoulder @ 90 degrees elevation, neutral rotation in plane of scapula 1 x 10 reps each  Side lying on right: Left shoulder ER with towel between arm and trunk: 3# weight 2 x 10reps Left shoulder abduction with manual resistance mild 2 x 10  Modalities: Moist heat applied to left shoulder at end of session with patient seated x 12 min: goal: pain, spasms: no adverse skin reactions noted  Patient response  to treatment: Patient demonstrated improved motor control with repetition and improved technique with VC for correct alignment. No increased pain reported throughout session. Decreased soreness with STM.           PT Education - 11/11/16 1230    Education provided Yes   Education Details HEP; re assessed scapular control, strengthening exercises   Person(s) Educated Patient   Methods Explanation;Demonstration;Verbal cues   Comprehension Verbalized understanding;Returned  demonstration;Verbal cues required             PT Long Term Goals - 10/29/16 0844      PT LONG TERM GOAL #1   Title Patient will be independent in home exercise program to improve strength/mobility for better functional independence with ADLs b 11/26/2016.    Baseline limited knowledge of appropriate pain control strategies, exercise progression for strength and ROM for advancing exercises   Status Revised     PT LONG TERM GOAL #2   Title Patient will improved Quick DASH score to 20% or less impairment demonstrating reduced self-reported upper extremity disability by 11/26/2016.    Baseline Quick dash 70%; current 07/16/16 40% impairment; 09/16/16 40% impairment; 10/29/16 38%   Status Revised     PT LONG TERM GOAL #3   Title Patient will increase LUE shoulder AROM: sitting: Shoulder flexion >140 degrees, abduction: >120 degrees,  for increased functional ROM with ADLs such as grooming, dressing etc by 11/26/2016   Baseline 09/16/16 AROM forward elevation 110, 10/29/2016 forward elevation to 120/125 inconsistently; decreased pain to mild; still unable to perform hair care without difficulty   Status Revised     PT LONG TERM GOAL #4   Title Patient will report pain level max. of <2/10 with right UE use by 11/26/2016 to allow improved function with overhead activities/personal care   Baseline pain leve max. 7/10 with using right UE for daily activities; current max level 5/10; 10/29/16 maximal pain level 3/10   Status Revised               Plan - 11/11/16 1213    Clinical Impression Statement Patient demonstrates improving strength and ROM left UE and is able to perform more activities with less difficulty and is still having difficulty with above shoulder level such as hair care.    Rehab Potential Fair   Clinical Impairments Affecting Rehab Potential (+) age, motivated (-)chronic pain right shoulder following surgery 10/2015; RTC tear post surgery   PT Frequency 2x / week   PT Duration 4  weeks   PT Treatment/Interventions Iontophoresis 4mg /ml Dexamethasone;Electrical Stimulation;Cryotherapy;Moist Heat;Ultrasound;Patient/family education;Neuromuscular re-education;Therapeutic exercise;Manual techniques;Scar mobilization;Taping;Dry needling   PT Next Visit Plan  pain control, muslce re education, ther. ex for motor control, strength, manual STM   PT Home Exercise Plan scapular control, pain control with heat/TENS, isometric band exercises and scapular retraction band exercise, strengthening   Consulted and Agree with Plan of Care Patient      Patient will benefit from skilled therapeutic intervention in order to improve the following deficits and impairments:  Impaired UE functional use, Increased muscle spasms, Decreased activity tolerance, Decreased knowledge of precautions, Pain, Impaired perceived functional ability, Decreased strength  Visit Diagnosis: Muscle weakness (generalized)  Stiffness of left shoulder, not elsewhere classified  Other muscle spasm  Left shoulder pain, unspecified chronicity     Problem List Patient Active Problem List   Diagnosis Date Noted  . Pulmonary hypertension 08/16/2012  . POTS (postural orthostatic tachycardia syndrome) 08/16/2012  . Chest pain 06/26/2012  . Shortness  of breath 06/26/2012  . Rapid heart beat 06/26/2012    Beacher May PT 11/12/2016, 6:23 PM  [facility] [address] [place], [address] Phone: [phone]   Fax:  [phone]  Name: Claudia [name] MRN: [id number] Date of Birth: [birth year]

## 2016-11-13 ENCOUNTER — Encounter: Payer: Self-pay | Admitting: Physical Therapy

## 2016-11-13 ENCOUNTER — Ambulatory Visit: Payer: 59 | Admitting: Physical Therapy

## 2016-11-13 DIAGNOSIS — M6281 Muscle weakness (generalized): Secondary | ICD-10-CM

## 2016-11-13 DIAGNOSIS — M75112 Incomplete rotator cuff tear or rupture of left shoulder, not specified as traumatic: Secondary | ICD-10-CM | POA: Diagnosis not present

## 2016-11-13 DIAGNOSIS — M25612 Stiffness of left shoulder, not elsewhere classified: Secondary | ICD-10-CM

## 2016-11-13 DIAGNOSIS — M25512 Pain in left shoulder: Secondary | ICD-10-CM | POA: Diagnosis not present

## 2016-11-13 DIAGNOSIS — M62838 Other muscle spasm: Secondary | ICD-10-CM | POA: Diagnosis not present

## 2016-11-13 NOTE — Therapy (Signed)
[place] [facility] [address] [place], [address] Phone: [phone]   Fax:  [phone]  Physical Therapy Treatment  Patient Details  Name: Claudia [name] MRN: [id number] Date of Birth: Aug 01, 1966 Referring Provider: Juanell Fairly, MD  Encounter Date: 11/13/2016      PT End of Session - 11/13/16 1044    Visit Number 47   Number of Visits 52   Date for PT Re-Evaluation 11/26/16   PT Start Time 1037   PT Stop Time 1135   PT Time Calculation (min) 58 min   Activity Tolerance Patient tolerated treatment well   Behavior During Therapy [facility] for tasks assessed/performed      Past Medical History:  Diagnosis Date  . Arthritis    knees, back;   . Asthma    H/O YEARS AGO-NO INHALERS  . Difficult intubation   . Dysrhythmia    H/O [facility] PROBLEMS SINCE 2013  . Rotator cuff tear    left    Past Surgical History:  Procedure Laterality Date  . CARPAL TUNNEL RELEASE     x2  . CHOLECYSTECTOMY    . COLONOSCOPY    . SHOULDER ARTHROSCOPY WITH OPEN ROTATOR CUFF REPAIR Left 10/31/2015   Procedure: SHOULDER ARTHROSCOPY, SUBACROMIAL DECOMPRESSION, BICEPS TENOTOMY AND MINI OPEN ROTATOR CUFF REPAIR;  Surgeon: Juanell Fairly, MD;  Location: [facility] ORS;  Service: Orthopedics;  Laterality: Left;  . TONSILLECTOMY      There were no vitals filed for this visit.      Subjective Assessment - 11/13/16 1041    Subjective Patient reports she was seen MD and is now able to lift 20# and is planning to return to work. She continues with decreased endurance for hair care and weakness in left UE but overall is seeing steady improvement.    Limitations House hold activities;Other (comment);Lifting   Patient Stated Goals pain relief to  be able to care for self, hair care, use left UE for housework, lifting pots, pans   Currently in Pain? No/denies         Objective: AROM: left shoulder forward elevation pre treatment  0-110 with mild to no substitution/hiking of shoulder noted Palpation: + mild spasms palpable posterior aspect left shoulder, teres muscles, upper trapezius  Treatment: Manual therapy: With patient seated and supine lying in conjunction with exercises: STM performed to left shoulder; pectoral, upper trapezius muscles with superficial and deep techniques: goal: improve spasms, ROM with decreased pain  Therapeutic Exercise: Patient performed exercises with verbal, tactile cues and demonstration of therapist as needed Cable exercises: Seated scapular rows bilateral 15# x 15 standing scapular row single arm 10# each x 15 reps (easier with right UE as compared to left) Standing straight arm pull downs  x 15with 15#  Shoulder extension right/left UE 5# x 15 reps each standing Reverse chin ups 25# x 20 reps  Standing at wall; bilateral shoulder scaption and ER x 10 each with cuing for correct alignment and controlled motion of left shoulder to decrease substitution Endurance exercises: bilateral shoulder partial horizontal abduction x 15, adduction x 15, shoulder rotations x 15 all with back against wall with scapular retraction, good alignment of shoulders  Supine lying: AAROM forward elevation, abduction, rotations x 10 each with stretch at end range; flexion to 160, full ER/IR Rhythmic stabilization left shoulder @ 90 degrees elevation, neutral rotation in plane of scapula 1 x 10 reps each  Prone lying left shoulder: scapular rows single arm  left 4# x 15 reps Prone shoulder extension with 3# weight x 5 Prone shoulder horizontal abduction x 10 no weight  Modalities: Moist heat applied to left shoulder at endof session with patient seated x 12 min: goal: pain, spasms: no adverse skin reactions noted  Patient response to treatment: patient demonstrated improved technique with exercises with minimal VC for correct alignment and demonstration for new exercises prone lying.  Patient  with decreased spasms by >50% following STM. Mild fatigue noted with exercises, no increased pain reported during session.             PT Education - 11/13/16 1200    Education provided Yes   Education Details HEP; added supine with towel vertical along thoracic spine for pectoral stretch, prone exercises: horizontal abduction, shoulder extension and row with/without weights   Person(s) Educated Patient   Methods Explanation;Demonstration;Verbal cues;Handout   Comprehension Verbalized understanding;Returned demonstration;Verbal cues required             PT Long Term Goals - 10/29/16 0844      PT LONG TERM GOAL #1   Title Patient will be independent in home exercise program to improve strength/mobility for better functional independence with ADLs b 11/26/2016.    Baseline limited knowledge of appropriate pain control strategies, exercise progression for strength and ROM for advancing exercises   Status Revised     PT LONG TERM GOAL #2   Title Patient will improved Quick DASH score to 20% or less impairment demonstrating reduced self-reported upper extremity disability by 11/26/2016.    Baseline Quick dash 70%; current 07/16/16 40% impairment; 09/16/16 40% impairment; 10/29/16 38%   Status Revised     PT LONG TERM GOAL #3   Title Patient will increase LUE shoulder AROM: sitting: Shoulder flexion >140 degrees, abduction: >120 degrees,  for increased functional ROM with ADLs such as grooming, dressing etc by 11/26/2016   Baseline 09/16/16 AROM forward elevation 110, 10/29/2016 forward elevation to 120/125 inconsistently; decreased pain to mild; still unable to perform hair care without difficulty   Status Revised     PT LONG TERM GOAL #4   Title Patient will report pain level max. of <2/10 with right UE use by 11/26/2016 to allow improved function with overhead activities/personal care   Baseline pain leve max. 7/10 with using right UE for daily activities; current max level 5/10; 10/29/16  maximal pain level 3/10   Status Revised               Plan - 11/13/16 1046    Clinical Impression Statement Patient demonstrates improved motor control decreased substitution with left shoulder. Patient demonstrates steady progress towards goals with improvement noted in ROM, strength, endurance. Improved functional use with left UE for daily tasks. Patient will benefit from continued physical therapy intervention to address limitations and achieve goals.    Rehab Potential Fair   Clinical Impairments Affecting Rehab Potential (+) age, motivated (-)chronic pain right shoulder following surgery 10/2015; RTC tear post surgery   PT Frequency 2x / week   PT Duration 4 weeks   PT Treatment/Interventions Iontophoresis 4mg /ml Dexamethasone;Electrical Stimulation;Cryotherapy;Moist Heat;Ultrasound;Patient/family education;Neuromuscular re-education;Therapeutic exercise;Manual techniques;Scar mobilization;Taping;Dry needling   PT Next Visit Plan  pain control, muslce re education, ther. ex for motor control, strength, manual STM   PT Home Exercise Plan scapular control, pain control with heat/TENS, isometric band exercises and scapular retraction band exercise, strengthening      Patient will benefit from skilled therapeutic intervention in order to improve the following  deficits and impairments:  Impaired UE functional use, Increased muscle spasms, Decreased activity tolerance, Decreased knowledge of precautions, Pain, Impaired perceived functional ability, Decreased strength  Visit Diagnosis: Muscle weakness (generalized)  Stiffness of left shoulder, not elsewhere classified  Other muscle spasm  Left shoulder pain, unspecified chronicity     Problem List Patient Active Problem List   Diagnosis Date Noted  . Pulmonary hypertension 08/16/2012  . POTS (postural orthostatic tachycardia syndrome) 08/16/2012  . Chest pain 06/26/2012  . Shortness of breath 06/26/2012  . Rapid heart beat  06/26/2012    Beacher May PT 11/13/2016, 12:14 PM  [place] [facility] PHYSICAL AND SPORTS MEDICINE [address], [place], [address] Phone: [phone]   Fax:  [phone]  Name: Claudia [name] MRN: [id number] Date of Birth: 10-28-1965

## 2016-11-18 ENCOUNTER — Encounter: Payer: Self-pay | Admitting: Physical Therapy

## 2016-11-18 ENCOUNTER — Ambulatory Visit: Payer: 59 | Admitting: Physical Therapy

## 2016-11-18 DIAGNOSIS — M6281 Muscle weakness (generalized): Secondary | ICD-10-CM

## 2016-11-18 DIAGNOSIS — M62838 Other muscle spasm: Secondary | ICD-10-CM

## 2016-11-18 DIAGNOSIS — M25512 Pain in left shoulder: Secondary | ICD-10-CM | POA: Diagnosis not present

## 2016-11-18 DIAGNOSIS — M25612 Stiffness of left shoulder, not elsewhere classified: Secondary | ICD-10-CM | POA: Diagnosis not present

## 2016-11-18 NOTE — Therapy (Signed)
[place] [facility] PHYSICAL AND SPORTS MEDICINE [address] [place], [name], [address] Phone: [phone]   Fax:  [phone]  Physical Therapy Treatment  Patient Details  Name: Claudia [name] MRN: [id number] Date of Birth: June 18, 1966 Referring Provider: Juanell FairlyKrasinski, Kevin, MD  Encounter Date: 11/18/2016      PT End of Session - 11/18/16 0853    Visit Number 48   Number of Visits 52   Date for PT Re-Evaluation 11/26/16   PT Start Time 0845   PT Stop Time 0943   PT Time Calculation (min) 58 min   Activity Tolerance Patient tolerated treatment well   Behavior During Therapy [facility] for tasks assessed/performed      Past Medical History:  Diagnosis Date  . Arthritis    knees, back;   . Asthma    H/O YEARS AGO-NO INHALERS  . Difficult intubation   . [facility]    [facility]  . Rotator cuff tear    left    Past Surgical History:  Procedure Laterality Date  . CARPAL TUNNEL RELEASE     x2  . CHOLECYSTECTOMY    . COLONOSCOPY    . SHOULDER ARTHROSCOPY WITH OPEN ROTATOR CUFF REPAIR Left 10/31/2015   Procedure: SHOULDER ARTHROSCOPY, SUBACROMIAL DECOMPRESSION, BICEPS TENOTOMY AND MINI OPEN ROTATOR CUFF REPAIR;  Surgeon: Juanell FairlyKevin Krasinski, MD;  Location: [facility] ORS;  Service: Orthopedics;  Laterality: Left;  . TONSILLECTOMY      There were no vitals filed for this visit.      Subjective Assessment - 11/18/16 0847    Subjective Patient reports her left shoulder continues with stiiffnesss in the morning and she is still having difficulty mostly with overhead motions and reaching behind her back such as performing hair care. She is feeling improvement overall with srength and ROM.   Limitations House hold activities;Other (comment);Lifting   Patient Stated Goals pain relief to  be able to care for self, hair care, use left UE for housework, lifting pots, pans   Currently in Pain? Other (Comment)  very mild, stiffness         Objective: AROM left shoulder flexion 0-110 pre treatment, IR behind back to hip pocket    Palpation: mild spasms left upper trapezius and decreased soft tissue elasticity pectoral muscle  Treatment: Manual therapy: With patient seated and supine lying in conjunction with exercises: STM performed to left shoulder; pectoral, upper trapezius muscles with superficial and deep techniques: goal: improve spasms, ROM with decreased pain  Therapeutic Exercise: Patient performed exercises with verbal, tactile cues and demonstration of therapist as needed Cable exercises: Seated scapular rows bilateral15# x 15 standing scapular row single arm 10# each x 12-15 reps (less difficult with left UE as compared right) Standing straight arm pull downs 2 x 15with 15#  Shoulder extension right/left UE 5# x 15 reps left UE, 12 reps right UE standing Reverse chin ups 25# 2 x 15 reps  Standing atwall; ER bilateral holding 1# weights in hands x 10 each with cuing for correct alignment and controlled motion of left shoulder to decrease substitution Pass 3# weight around trunk 10 reps each direction   Supine lying: AAROM forward elevation, abduction, rotations x 10 each with stretch at end range; flexion to 160, full ER/IR Bilateral forward flexion holding 5# weight/ball x 15 reps Rhythmic stabilization left shoulder @ 90 degrees elevation, neutral rotation in plane of scapula 1x 10 reps each  Modalities: moist heat pack applied to left shoulder  at end of session with patient seated in chair with left UE supported on pillow x 12 min.: unbilled ( no adverse reactions to skin noted) goal: pain control  Patient response to treatment: improved shoulder forward elevation to 120 degrees at end of session, improved motor control with all exercises with repetition and VC for correct technique, alignment. Decreased spasms 50% following STM, improved soft tissue elasticity which allowed improved ROM with less effort.  Mild fatigue reported with exercises and no increased pain reported throughout session         PT Education - 11/18/16 0851    Education provided Yes   Education Details HEP: reinforced stretching with handout; prone lying exercises.    Person(s) Educated Patient   Methods Explanation;Demonstration;Verbal cues;Handout   Comprehension Verbalized understanding;Returned demonstration;Verbal cues required             PT Long Term Goals - 10/29/16 0844      PT LONG TERM GOAL #1   Title Patient will be independent in home exercise program to improve strength/mobility for better functional independence with ADLs b 11/26/2016.    Baseline limited knowledge of appropriate pain control strategies, exercise progression for strength and ROM for advancing exercises   Status Revised     PT LONG TERM GOAL #2   Title Patient will improved Quick DASH score to 20% or less impairment demonstrating reduced self-reported upper extremity disability by 11/26/2016.    Baseline Quick dash 70%; current 07/16/16 40% impairment; 09/16/16 40% impairment; 10/29/16 38%   Status Revised     PT LONG TERM GOAL #3   Title Patient will increase LUE shoulder AROM: sitting: Shoulder flexion >140 degrees, abduction: >120 degrees,  for increased functional ROM with ADLs such as grooming, dressing etc by 11/26/2016   Baseline 09/16/16 AROM forward elevation 110, 10/29/2016 forward elevation to 120/125 inconsistently; decreased pain to mild; still unable to perform hair care without difficulty   Status Revised     PT LONG TERM GOAL #4   Title Patient will report pain level max. of <2/10 with right UE use by 11/26/2016 to allow improved function with overhead activities/personal care   Baseline pain leve max. 7/10 with using right UE for daily activities; current max level 5/10; 10/29/16 maximal pain level 3/10   Status Revised               Plan - 11/18/16 0854    Clinical Impression Statement Patient demonstrates  improvement with Left shoulder ROM and strength with treatment and improving function with daily tasks. She continues with decreased ROM due to decreased strength left shoulder and will benefit from additional physical therapy intervention to further instruct in progressive exercises to be able to transition to independent home program.    Rehab Potential Fair   Clinical Impairments Affecting Rehab Potential (+) age, motivated (-)chronic pain right shoulder following surgery 10/2015; RTC tear post surgery   PT Frequency 2x / week   PT Duration 4 weeks   PT Treatment/Interventions Iontophoresis 4mg /ml Dexamethasone;Electrical Stimulation;Cryotherapy;Moist Heat;Ultrasound;Patient/family education;Neuromuscular re-education;Therapeutic exercise;Manual techniques;Scar mobilization;Taping;Dry needling   PT Next Visit Plan  pain control, muslce re education, ther. ex for motor control, strength, manual STM   PT Home Exercise Plan scapular control, pain control with heat/TENS, isometric band exercises and scapular retraction band exercise, strengthening      Patient will benefit from skilled therapeutic intervention in order to improve the following deficits and impairments:  Impaired UE functional use, Increased muscle spasms, Decreased activity tolerance, Decreased  knowledge of precautions, Pain, Impaired perceived functional ability, Decreased strength  Visit Diagnosis: Muscle weakness (generalized)  Stiffness of left shoulder, not elsewhere classified  Other muscle spasm     Problem List Patient Active Problem List   Diagnosis Date Noted  . Pulmonary hypertension 08/16/2012  . POTS (postural orthostatic tachycardia syndrome) 08/16/2012  . Chest pain 06/26/2012  . Shortness of breath 06/26/2012  . Rapid heart beat 06/26/2012    Beacher May PT 11/18/2016, 9:54 AM  [place] [facility] PHYSICAL AND SPORTS MEDICINE [address] [place],  [address] Phone: [phone]   Fax:  [phone]  Name: Claudia [name] MRN: [id number] Date of Birth: [birth year]

## 2016-11-20 ENCOUNTER — Ambulatory Visit: Payer: 59 | Admitting: Physical Therapy

## 2016-11-20 ENCOUNTER — Encounter: Payer: Self-pay | Admitting: Physical Therapy

## 2016-11-20 DIAGNOSIS — M6281 Muscle weakness (generalized): Secondary | ICD-10-CM

## 2016-11-20 DIAGNOSIS — M62838 Other muscle spasm: Secondary | ICD-10-CM | POA: Diagnosis not present

## 2016-11-20 DIAGNOSIS — M25512 Pain in left shoulder: Secondary | ICD-10-CM

## 2016-11-20 DIAGNOSIS — M25612 Stiffness of left shoulder, not elsewhere classified: Secondary | ICD-10-CM | POA: Diagnosis not present

## 2016-11-20 NOTE — Therapy (Signed)
[place] [facility] PHYSICAL AND SPORTS MEDICINE [address] [place], [name], [address] Phone: [phone]   Fax:  [phone]  Physical Therapy Treatment  Patient Details  Name: Claudia [name] MRN: [id number] Date of Birth: Aug 03, 1966 Referring Provider: Juanell FairlyKrasinski, Kevin, MD  Encounter Date: 11/20/2016      PT End of Session - 11/20/16 0755    Visit Number 49   Number of Visits 52   Date for PT Re-Evaluation 11/26/16   PT Start Time 0752   PT Stop Time 0840   PT Time Calculation (min) 48 min   Activity Tolerance Patient tolerated treatment well   Behavior During Therapy [facility] for tasks assessed/performed      Past Medical History:  Diagnosis Date  . Arthritis    knees, back;   . Asthma    H/O YEARS AGO-NO INHALERS  . Difficult intubation   . D[facility]rhythmia    [facility]  . Rotator cuff tear    left    Past Surgical History:  Procedure Laterality Date  . CARPAL TUNNEL RELEASE     x2  . CHOLECYSTECTOMY    . COLONOSCOPY    . SHOULDER ARTHROSCOPY WITH OPEN ROTATOR CUFF REPAIR Left 10/31/2015   Procedure: SHOULDER ARTHROSCOPY, SUBACROMIAL DECOMPRESSION, BICEPS TENOTOMY AND MINI OPEN ROTATOR CUFF REPAIR;  Surgeon: Juanell FairlyKevin Krasinski, MD;  Location: [facility] ORS;  Service: Orthopedics;  Laterality: Left;  . TONSILLECTOMY      There were no vitals filed for this visit.      Subjective Assessment - 11/20/16 0754    Subjective Patient reports her left shoulder continues with stiiffnesss in the morning and she is still having difficulty mostly with overhead motions such as performing hair care. She is feeling improvement overall with srength and ROM.   Limitations House hold activities;Other (comment);Lifting   Patient Stated Goals pain relief to  be able to care for self, hair care, use left UE for housework, lifting pots, pans   Currently in Pain? Other (Comment)  only stiffness, no pain, taking meloxicam and tylenol to  control pain         Objective: AROM left shoulder flexion 0-125 pre treatment, IR behind back to hip pocket    Palpation: mild spasms left upper trapezius and decreased soft tissue elasticity subscapularis, lats  Treatment: Manual therapy: With patient seated and supine lying in conjunction with exercises: STM performed to left shoulder; pectoral, subscapularis with superficial and deep techniques: goal: improve spasms, ROM with decreased pain  Therapeutic Exercise: Patient performed exercises with verbal, tactile cues and demonstration of therapist as needed Cable exercises: Seated scapular rows bilateral15# x 15 standing scapular row single arm 10# each x 15 reps (less difficult with left UE as compared right) Standing straight arm pull downs 2 x 15with 15#  Reverse chin ups 25# 2 x 15reps  Standing atwall; ER bilateral holding 2# weights in hands x 10 each with cuing for correct alignment and controlled motion of left shoulder to decrease substitution scaption bilateral with 1# weights in hands through partial ROM to control shoulder substitution Pass 3# weight around trunk 10 reps each direction   Prone lying:  Left shoulder horizontal abduction 2 x 10 Left shoulder extension with 1# weight 2 x 10  Supine lying: AAROM forward elevation, abduction, rotations x 10 each with stretch at end range; flexion to 160, full ER/IR Bilateral forward flexion holding 5# weight x 15 reps Rhythmic stabilization left shoulder @ 90 degrees  elevation, neutral rotation in plane of scapula 1x 10 reps each  Modalities: moist heat pack applied to left shoulder at end of session with patient seated in chair with left UE supported on pillow x 10 min.: unbilled ( no adverse reactions to skin noted) goal: pain control  Patient response to treatment: improved shoulder forward elevation to 125 degrees, improved motor control with all exercises with repetition and VC for correct technique,  alignment. Improved soft tissue elasticity with improved flexibility left shoulder. Mild fatigue reported with exercises without increase pain during session.          PT Education - 11/20/16 0825    Education provided Yes   Education Details HEP: added prone lying horizontal abduction and shoulder extension with weight   Person(s) Educated Patient   Methods Explanation;Demonstration;Verbal cues   Comprehension Verbalized understanding;Returned demonstration;Verbal cues required             PT Long Term Goals - 10/29/16 0844      PT LONG TERM GOAL #1   Title Patient will be independent in home exercise program to improve strength/mobility for better functional independence with ADLs b 11/26/2016.    Baseline limited knowledge of appropriate pain control strategies, exercise progression for strength and ROM for advancing exercises   Status Revised     PT LONG TERM GOAL #2   Title Patient will improved Quick DASH score to 20% or less impairment demonstrating reduced self-reported upper extremity disability by 11/26/2016.    Baseline Quick dash 70%; current 07/16/16 40% impairment; 09/16/16 40% impairment; 10/29/16 38%   Status Revised     PT LONG TERM GOAL #3   Title Patient will increase LUE shoulder AROM: sitting: Shoulder flexion >140 degrees, abduction: >120 degrees,  for increased functional ROM with ADLs such as grooming, dressing etc by 11/26/2016   Baseline 09/16/16 AROM forward elevation 110, 10/29/2016 forward elevation to 120/125 inconsistently; decreased pain to mild; still unable to perform hair care without difficulty   Status Revised     PT LONG TERM GOAL #4   Title Patient will report pain level max. of <2/10 with right UE use by 11/26/2016 to allow improved function with overhead activities/personal care   Baseline pain leve max. 7/10 with using right UE for daily activities; current max level 5/10; 10/29/16 maximal pain level 3/10   Status Revised                Plan - 11/20/16 0756    Clinical Impression Statement Patient continues to improve function with left UE and is progressing well towards goals. Anticipate discharge next session following re assessment.    Rehab Potential Fair   Clinical Impairments Affecting Rehab Potential (+) age, motivated (-)chronic pain right shoulder following surgery 10/2015; RTC tear post surgery   PT Frequency 2x / week   PT Duration 4 weeks   PT Treatment/Interventions Iontophoresis 4mg /ml Dexamethasone;Electrical Stimulation;Cryotherapy;Moist Heat;Ultrasound;Patient/family education;Neuromuscular re-education;Therapeutic exercise;Manual techniques;Scar mobilization;Taping;Dry needling   PT Next Visit Plan  pain control, muslce re education, ther. ex for motor control, strength, manual STM and re assess QuickDash, strength, ROM   PT Home Exercise Plan scapular control, scapular retraction band exercise, prone and supine strengthening, standing at wall for ER, scaption, push ups       Patient will benefit from skilled therapeutic intervention in order to improve the following deficits and impairments:  Impaired UE functional use, Increased muscle spasms, Decreased activity tolerance, Decreased knowledge of precautions, Pain, Impaired perceived functional ability, Decreased strength  Visit Diagnosis: Muscle weakness (generalized)  Stiffness of left shoulder, not elsewhere classified  Other muscle spasm  Left shoulder pain, unspecified chronicity     Problem List Patient Active Problem List   Diagnosis Date Noted  . Pulmonary hypertension 08/16/2012  . POTS (postural orthostatic tachycardia syndrome) 08/16/2012  . Chest pain 06/26/2012  . Shortness of breath 06/26/2012  . Rapid heart beat 06/26/2012    Beacher May PT 11/20/2016, 5:23 PM  [place] [facility] PHYSICAL AND SPORTS MEDICINE [address] [place], [address] Phone: [phone]   Fax:   [phone]  Name: Claudia [name] MRN: [id number] Date of Birth: 16-Jul-1966

## 2016-11-26 ENCOUNTER — Ambulatory Visit: Payer: 59 | Attending: Orthopedic Surgery | Admitting: Physical Therapy

## 2016-11-26 ENCOUNTER — Encounter: Payer: Self-pay | Admitting: Physical Therapy

## 2016-11-26 DIAGNOSIS — M25512 Pain in left shoulder: Secondary | ICD-10-CM | POA: Insufficient documentation

## 2016-11-26 DIAGNOSIS — M6281 Muscle weakness (generalized): Secondary | ICD-10-CM | POA: Insufficient documentation

## 2016-11-26 DIAGNOSIS — M25612 Stiffness of left shoulder, not elsewhere classified: Secondary | ICD-10-CM | POA: Insufficient documentation

## 2016-11-26 DIAGNOSIS — M62838 Other muscle spasm: Secondary | ICD-10-CM | POA: Insufficient documentation

## 2016-11-27 NOTE — Therapy (Signed)
[facility] [address] [place], [address] Phone: [phone]   Fax:  [phone]  Physical Therapy Treatment/Discharge Summary  Patient Details  Name: Claudia [name] MRN: [id number] Date of Birth: February 25, 1966 Referring Provider: Thornton Park, MD  Encounter Date: 11/26/2016   Patient began physical therapy 05/17/2016 and attended 50 sessions through 11/26/2016 with goal of independent home program achieved and other goals for ROM and pain partially met. She is ready for discharge from physical therapy at this time.       PT End of Session - 11/26/16 0811    Visit Number [id number]   Number of Visits 52   Date for PT Re-Evaluation 11/26/16   PT Start Time 0800   PT Stop Time 0900   PT Time Calculation (min) 60 min   Activity Tolerance Patient tolerated treatment well   Behavior During Therapy [facility] for tasks assessed/performed      Past Medical History:  Diagnosis Date  . Arthritis    knees, back;   . Asthma    H/O YEARS AGO-NO INHALERS  . Difficult intubation   . Dysrhythmia    H/O [facility] PROBLEMS SINCE 2013  . Rotator cuff tear    left    Past Surgical History:  Procedure Laterality Date  . CARPAL TUNNEL RELEASE     x2  . CHOLECYSTECTOMY    . COLONOSCOPY    . SHOULDER ARTHROSCOPY WITH OPEN ROTATOR CUFF REPAIR Left 10/31/2015   Procedure: SHOULDER ARTHROSCOPY, SUBACROMIAL DECOMPRESSION, BICEPS TENOTOMY AND MINI OPEN ROTATOR CUFF REPAIR;  Surgeon: Thornton Park, MD;  Location: [facility] ORS;  Service: Orthopedics;  Laterality: Left;  . TONSILLECTOMY      There were no vitals filed for this visit.      Subjective Assessment - 11/26/16 0803    Subjective Patient reports she is doing much better and greatly improved since beginning physical therapy. She agrees to discharge from physical therapy at this time to be independent with self management.    Limitations House hold activities;Other  (comment);Lifting   Patient Stated Goals pain relief to  be able to care for self, hair care, use left UE for housework, lifting pots, pans   Currently in Pain? Other (Comment)  stiffness, no pain, taking medication for pain control Pain/stiffness level may intermittently reach 5/10      Objective: Outcome measure: QuickDash 34% (0 = no self perceived disability); initially was 70% indicating significant improvement since beginning therapy AROM:  Left shoulder flexion 0-125 degrees (initially was 70); abduction limited in sitting, able to perform to 90/100 side lying AAROM:  Left shoulder flexion 0-160, Abduction 120, ER/IR WNL; all with stiffness at end range Strength: Left shoulder flexion 3+/5 through available ROM, ER 4-/5, IR 4/5, abduction 3+/5; all improving    Treatment: Manual therapy: With patient seated and supine lying in conjunction with exercises: STM performed to left shoulder; pectoral, subscapularis with superficial and deep techniques: goal: improve spasms, ROM with decreased pain  Therapeutic Exercise: Patient performed exercises with verbal, tactile cues and demonstration of therapist as needed  Prone lying:  Left shoulder horizontal abduction 2 x 10 Left shoulder extension with 3# weight 2 x 10 Prone single arm row with 3# weight x 15 reps  Standing at wall: Wall push ups x 10 with demonstration and repetition to perform correctly scaption without weights x 10 Facing wall for angels for pectoral stretching x 10 reps  Side lying right:performed exercises with left  UE with assist/VC ER with towel under arm 3# weight x 15 reps Abduction without weight with isometric hold at 90 and lower slowly x 10 reps  Supine lying: AAROM forward elevation, abduction, rotations x 10 each with stretch at end range; flexion to 160, full ER/IR Bilateral forward flexion holding 5# weight x 15 reps Rhythmic stabilization left shoulder @ 90 degrees elevation, neutral  rotation in plane of scapula 1x 10 reps each  Modalities: moist heat pack applied to left shoulder at end of session with patient seated in chair with left UE supported on pillow x 10 min.: unbilled ( no adverse reactions to skin noted) goal: pain control    Patient response to treatment: Patient required minimal VC and demonstration to perform exercises with good technique. Increased stiffness and discomfort in left shoulder with ROM exercises, decreased with repetition and STM.   Patient verbalized good understanding of home exercises.          PT Education - 11/26/16 0809    Education provided Yes   Education Details HEP re asssessed to continue independently; added wall push ups   Person(s) Educated Patient   Methods Explanation;Verbal cues;Handout   Comprehension Verbalized understanding;Returned demonstration;Verbal cues required             PT Long Term Goals - 11/26/16 0825      PT LONG TERM GOAL #1   Title Patient will be independent in home exercise program to improve strength/mobility for better functional independence with ADLs b 11/26/2016.    Baseline limited knowledge of appropriate pain control strategies, exercise progression for strength and ROM for advancing exercises   Status Achieved     PT LONG TERM GOAL #2   Title Patient will improved Quick DASH score to 20% or less impairment demonstrating reduced self-reported upper extremity disability by 11/26/2016.    Baseline Quick dash 70%; current 07/16/16 40% impairment; 09/16/16 40% impairment; 10/29/16 38%; 11/26/16 34%    Status Partially Met     PT LONG TERM GOAL #3   Title Patient will increase LUE shoulder AROM: sitting: Shoulder flexion >140 degrees, abduction: >120 degrees,  for increased functional ROM with ADLs such as grooming, dressing etc by 11/26/2016   Baseline 09/16/16 AROM forward elevation 110, 10/29/2016 forward elevation to 120/125 inconsistently; decreased pain to mild; still unable to perform hair  care without difficulty; improving, not met for 140 degrees    Status Partially Met     PT LONG TERM GOAL #4   Title Patient will report pain level max. of <2/10 with right UE use by 11/26/2016 to allow improved function with overhead activities/personal care   Baseline pain leve max. 7/10 with using right UE for daily activities; current max level 5/10; 10/29/16 maximal pain level 3/10; 11/26/16 reports pain level may still reach 5/10 intermittently   Status Partially Met               Plan - 11/26/16 4174    Clinical Impression Statement Patient has achieved or partially met all goals and is ready for discharge to independent home program. She should continue to improve with self management.    Rehab Potential Fair   Clinical Impairments Affecting Rehab Potential (+) age, motivated (-)chronic pain right shoulder following surgery 10/2015; RTC tear post surgery   PT Frequency 2x / week   PT Duration 4 weeks   PT Treatment/Interventions Iontophoresis 75m/ml Dexamethasone;Electrical Stimulation;Cryotherapy;Moist Heat;Ultrasound;Patient/family education;Neuromuscular re-education;Therapeutic exercise;Manual techniques;Scar mobilization;Taping;Dry needling   PT Next Visit Plan  pain control, muslce re education, ther. ex for motor control, strength, manual STM and re assess QuickDash, strength, ROM   PT Home Exercise Plan scapular control, scapular retraction band exercise, prone and supine strengthening, standing at wall for ER, scaption, push ups    Consulted and Agree with Plan of Care Patient      Patient will benefit from skilled therapeutic intervention in order to improve the following deficits and impairments:  Impaired UE functional use, Increased muscle spasms, Decreased activity tolerance, Decreased knowledge of precautions, Pain, Impaired perceived functional ability, Decreased strength  Visit Diagnosis: Muscle weakness (generalized)  Stiffness of left shoulder, not elsewhere  classified  Other muscle spasm  Left shoulder pain, unspecified chronicity     Problem List Patient Active Problem List   Diagnosis Date Noted  . Pulmonary hypertension 08/16/2012  . POTS (postural orthostatic tachycardia syndrome) 08/16/2012  . Chest pain 06/26/2012  . Shortness of breath 06/26/2012  . Rapid heart beat 06/26/2012    Jomarie Longs PT 11/27/2016, 8:28 AM  [place] [facility] [address], [place], [address] Phone: [phone]   Fax:  [phone]  Name: MARJORIE DEPREY MRN: [id number] Date of Birth: 11/26/1965

## 2016-11-29 ENCOUNTER — Encounter: Payer: 59 | Admitting: Physical Therapy

## 2016-12-02 ENCOUNTER — Encounter: Payer: 59 | Admitting: Physical Therapy

## 2016-12-05 ENCOUNTER — Encounter: Payer: 59 | Admitting: Physical Therapy

## 2016-12-30 DIAGNOSIS — Z124 Encounter for screening for malignant neoplasm of cervix: Secondary | ICD-10-CM | POA: Diagnosis not present

## 2016-12-30 DIAGNOSIS — Z01419 Encounter for gynecological examination (general) (routine) without abnormal findings: Secondary | ICD-10-CM | POA: Diagnosis not present

## 2016-12-30 DIAGNOSIS — M25529 Pain in unspecified elbow: Secondary | ICD-10-CM | POA: Diagnosis not present

## 2016-12-31 DIAGNOSIS — Z136 Encounter for screening for cardiovascular disorders: Secondary | ICD-10-CM | POA: Diagnosis not present

## 2016-12-31 DIAGNOSIS — Z1329 Encounter for screening for other suspected endocrine disorder: Secondary | ICD-10-CM | POA: Diagnosis not present

## 2016-12-31 DIAGNOSIS — Z1321 Encounter for screening for nutritional disorder: Secondary | ICD-10-CM | POA: Diagnosis not present

## 2016-12-31 DIAGNOSIS — Z131 Encounter for screening for diabetes mellitus: Secondary | ICD-10-CM | POA: Diagnosis not present

## 2016-12-31 DIAGNOSIS — Z13220 Encounter for screening for lipoid disorders: Secondary | ICD-10-CM | POA: Diagnosis not present

## 2017-01-07 DIAGNOSIS — R928 Other abnormal and inconclusive findings on diagnostic imaging of breast: Secondary | ICD-10-CM | POA: Diagnosis not present

## 2017-01-07 DIAGNOSIS — Z13820 Encounter for screening for osteoporosis: Secondary | ICD-10-CM | POA: Diagnosis not present

## 2017-01-07 DIAGNOSIS — Z1231 Encounter for screening mammogram for malignant neoplasm of breast: Secondary | ICD-10-CM | POA: Diagnosis not present

## 2017-01-07 DIAGNOSIS — Z780 Asymptomatic menopausal state: Secondary | ICD-10-CM | POA: Diagnosis not present

## 2017-01-09 ENCOUNTER — Encounter: Payer: Self-pay | Admitting: *Deleted

## 2017-01-28 ENCOUNTER — Ambulatory Visit: Payer: Self-pay | Admitting: General Surgery

## 2017-03-11 ENCOUNTER — Encounter: Payer: Self-pay | Admitting: *Deleted

## 2017-05-21 DIAGNOSIS — Z1321 Encounter for screening for nutritional disorder: Secondary | ICD-10-CM | POA: Diagnosis not present

## 2017-05-21 DIAGNOSIS — Z1329 Encounter for screening for other suspected endocrine disorder: Secondary | ICD-10-CM | POA: Diagnosis not present

## 2017-05-21 DIAGNOSIS — Z136 Encounter for screening for cardiovascular disorders: Secondary | ICD-10-CM | POA: Diagnosis not present

## 2017-05-21 DIAGNOSIS — Z13220 Encounter for screening for lipoid disorders: Secondary | ICD-10-CM | POA: Diagnosis not present

## 2017-05-21 DIAGNOSIS — Z131 Encounter for screening for diabetes mellitus: Secondary | ICD-10-CM | POA: Diagnosis not present

## 2017-05-21 DIAGNOSIS — M25519 Pain in unspecified shoulder: Secondary | ICD-10-CM | POA: Diagnosis not present

## 2017-07-30 DIAGNOSIS — I10 Essential (primary) hypertension: Secondary | ICD-10-CM | POA: Diagnosis not present

## 2017-10-21 IMAGING — RF DG ARTHROGRAM SHOULDER*L*
1 series · 1 of 1 positions shown · non-contrast
Comparison: None.

CLINICAL DATA: Left shoulder pain, decreased range of motion

EXAM:
ARTHROGRAM OF THE LEFT SHOULDER
FLUOROSCOPY TIME:  Radiation Exposure Index (as provided by the
fluoroscopic device): 1.3 mGy

[Series 1: cp_standard · 0.18mm/px · 1 of 1 slices shown]
[im 1/1]
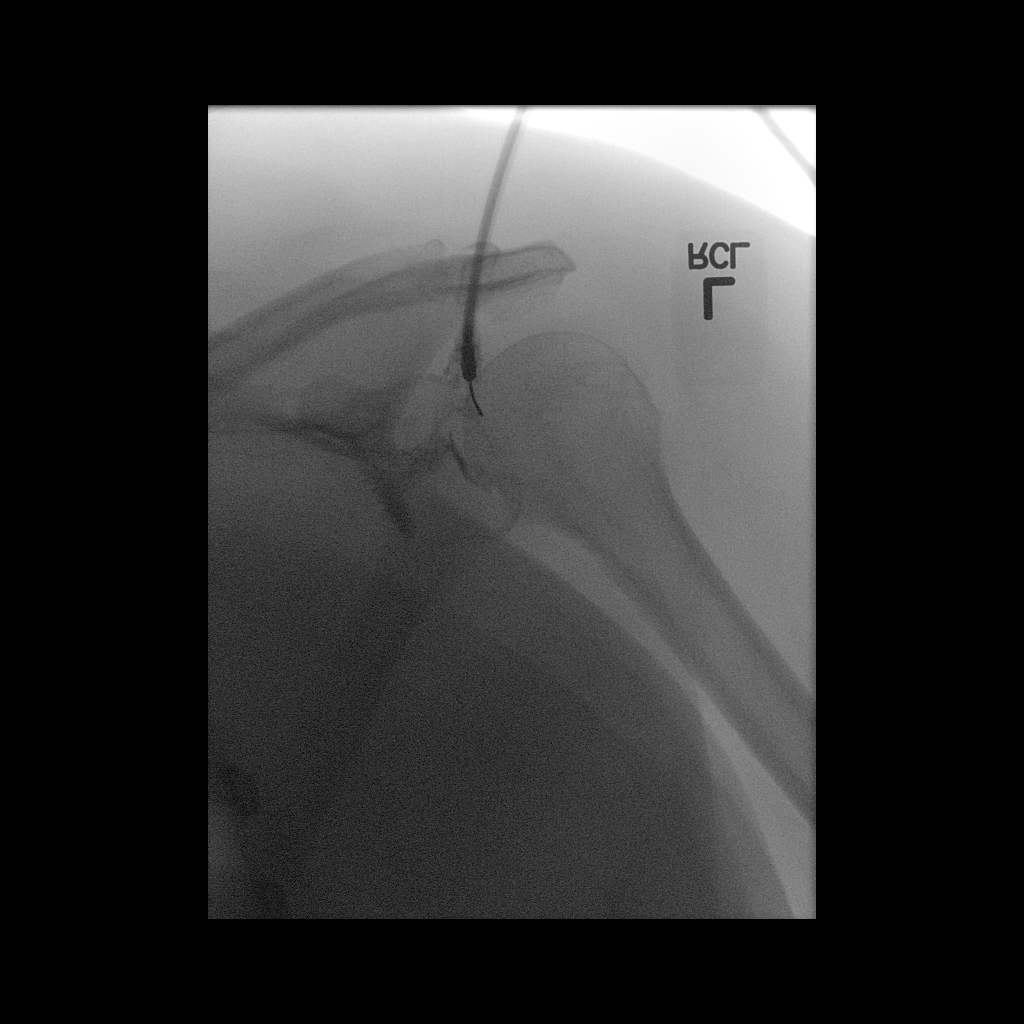

[1 of 1 positions shown; findings below may reference images not displayed]

FINDINGS: The risks and benefits of the procedure were discussed with the
patient, and written informed consent was obtained. The patient
stated no history of allergy to contrast media. A formal timeout
procedure was performed with the patient according to departmental
protocol.

The patient was placed supine on the fluoroscopy table and the left
glenohumeral joint was identified under fluoroscopy. The skin
overlying the left glenohumeral joint was subsequently cleaned with
Chloraprep and a sterile drape was placed over the area of interest.
5 ml 1% Lidocaine was used to anesthetize the skin around the needle
insertion site.

A 22 gauge spinal needle was inserted into the left glenohumeral
joint under fluoroscopy. Position was confirmed with injection of
less than 1ml of Omnipaque 300 under fluoroscopy.

12 ml of gadolinium mixture (0.1 ml of Multihance mixed with 20 mL
of sterile saline ) was injected into the left glenohumeral joint.

The needle was removed and hemostasis was achieved. The patient was
subsequently transferred to MRI for imaging.
IMPRESSION: Successful left shoulder arthrogram prior to MRI.

## 2017-10-22 DIAGNOSIS — I10 Essential (primary) hypertension: Secondary | ICD-10-CM | POA: Diagnosis not present

## 2017-12-04 IMAGING — CR DG ABDOMEN 1V
1 series · 2 of 2 positions shown · non-contrast
Comparison: None.

CLINICAL DATA: Constipation

EXAM:
ABDOMEN - 1 VIEW

[Series 1: dg abd 1 view · 0.14mm/px · 2 of 2 slices shown]
[im 1/2]
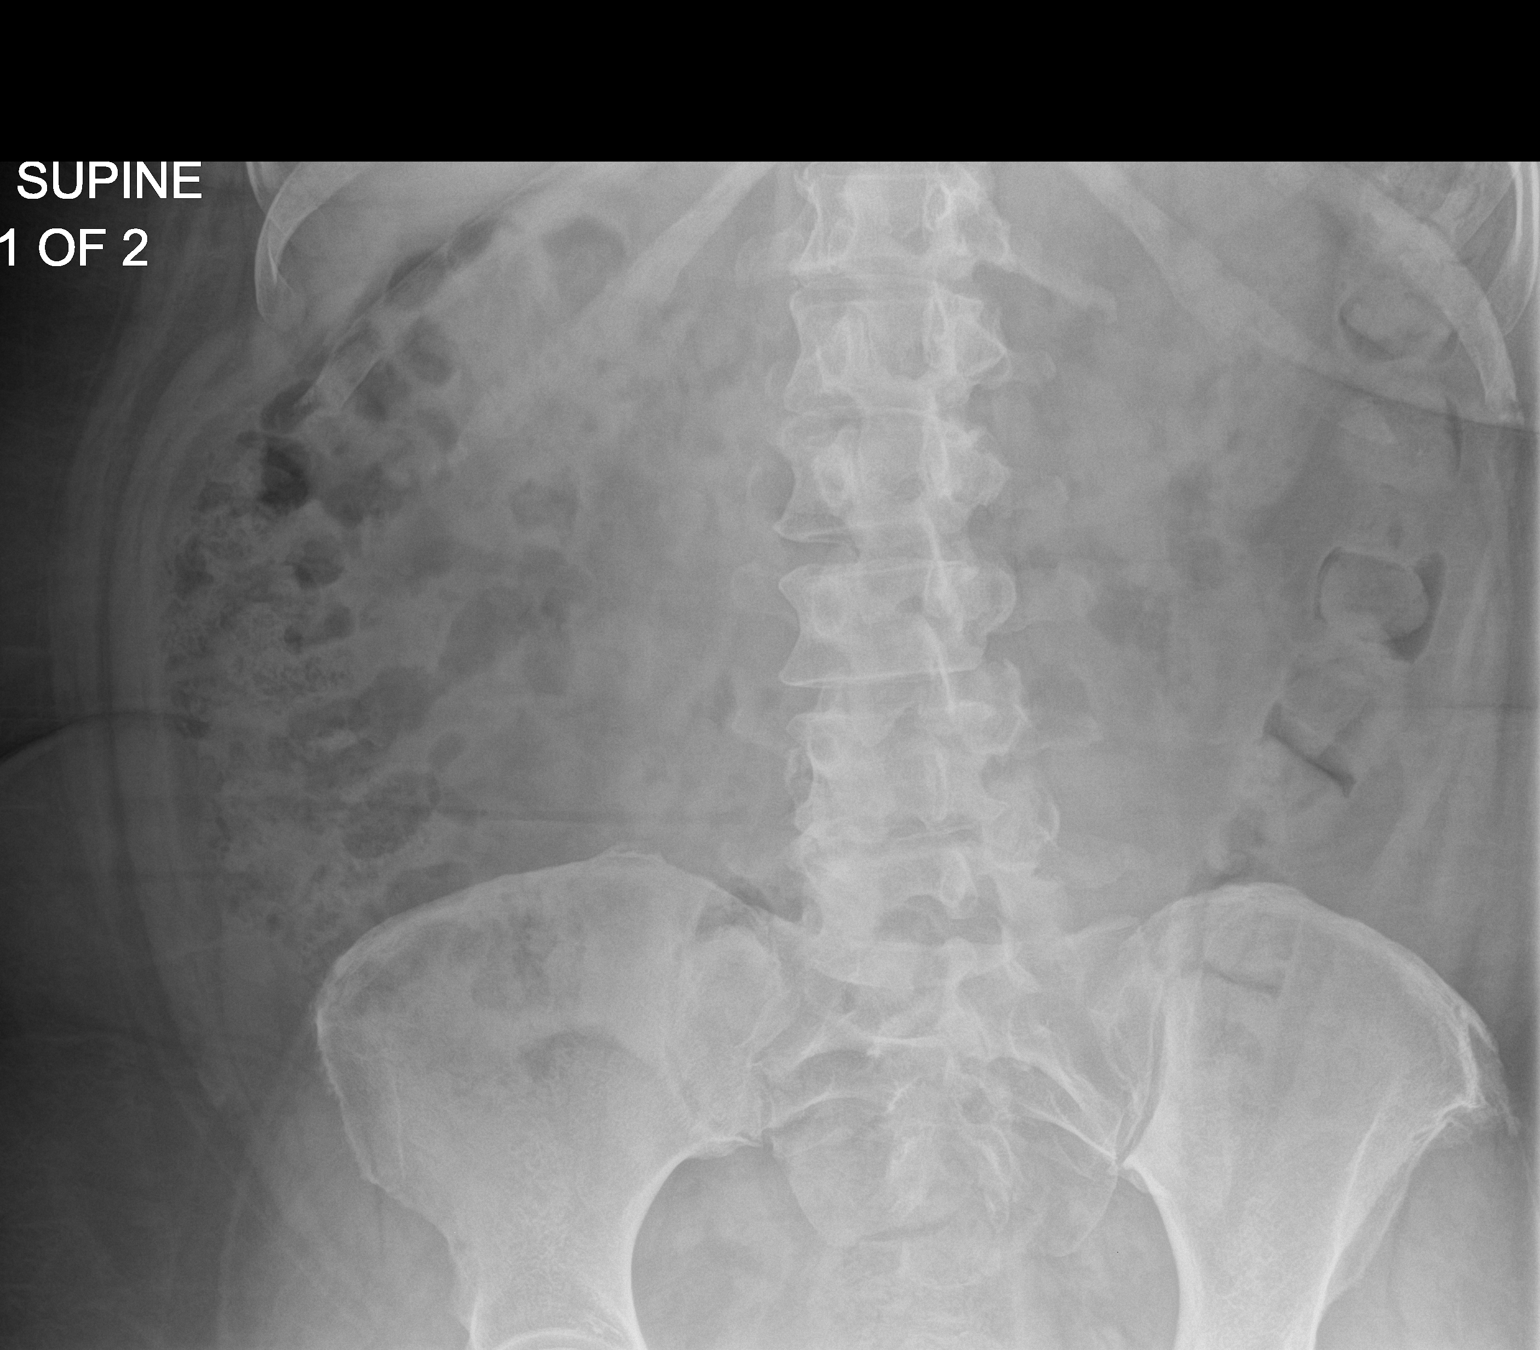
[im 2/2]
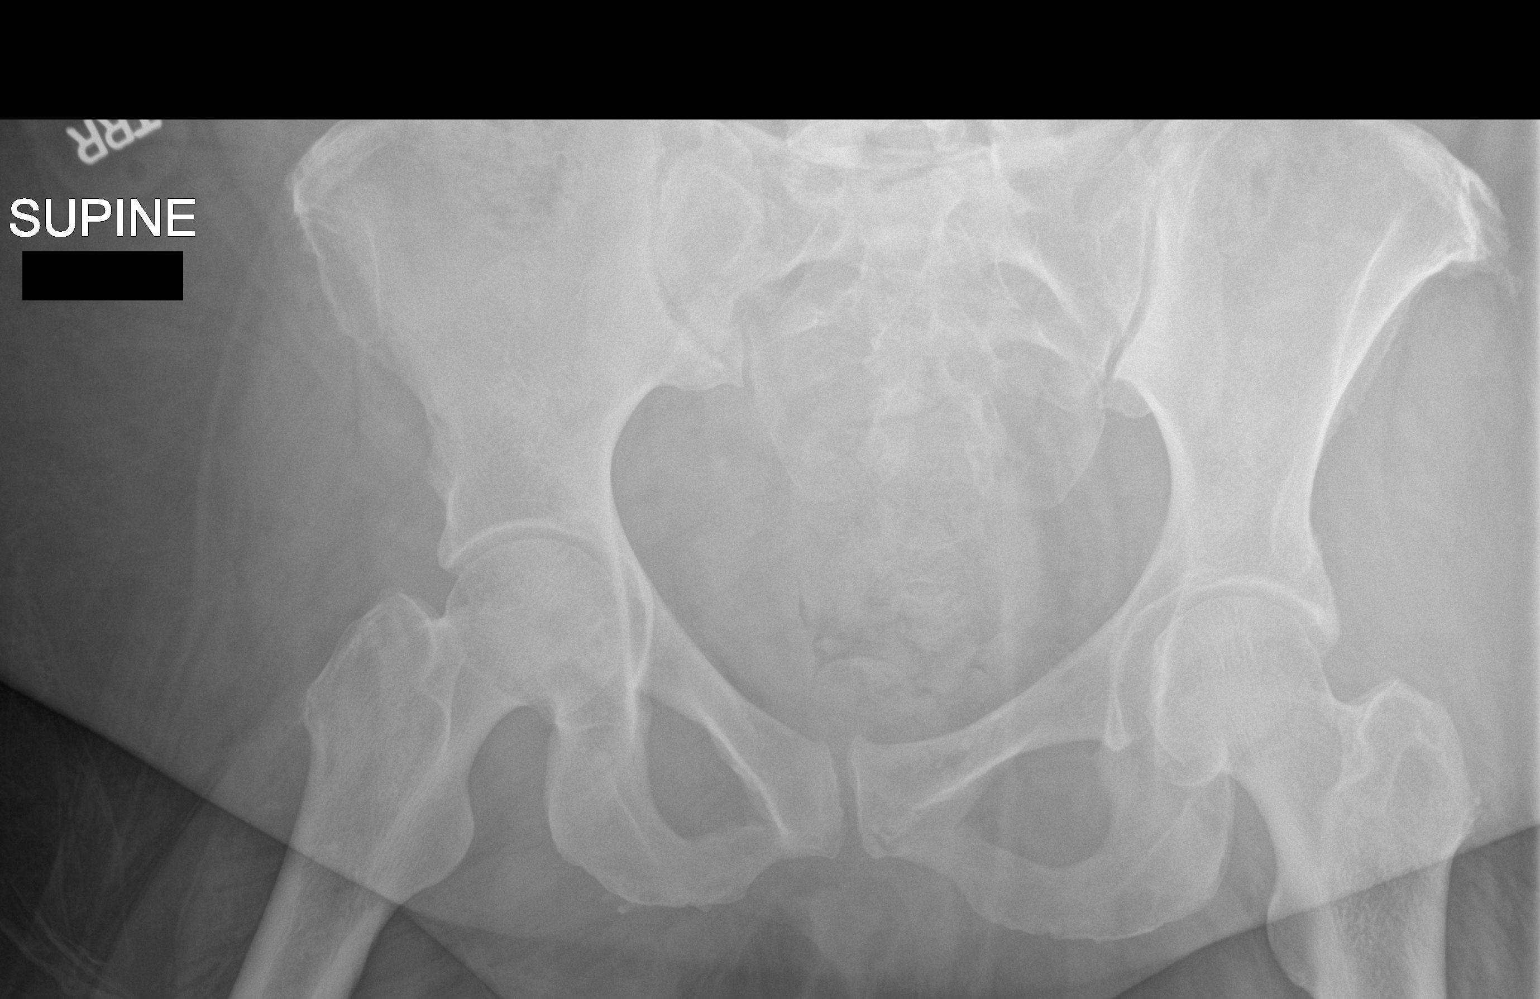

[2 of 2 positions shown; findings below may reference images not displayed]

FINDINGS: Moderate stool volume without obstruction or impaction.
Cholecystectomy clips. No concerning intra-abdominal mass effect or
calcification.
IMPRESSION: Moderate stool volume without obstruction or impaction

## 2018-02-05 DIAGNOSIS — E559 Vitamin D deficiency, unspecified: Secondary | ICD-10-CM | POA: Diagnosis not present

## 2018-02-05 DIAGNOSIS — I10 Essential (primary) hypertension: Secondary | ICD-10-CM | POA: Diagnosis not present

## 2018-04-02 DIAGNOSIS — R7309 Other abnormal glucose: Secondary | ICD-10-CM | POA: Diagnosis not present

## 2018-04-02 DIAGNOSIS — E559 Vitamin D deficiency, unspecified: Secondary | ICD-10-CM | POA: Diagnosis not present

## 2018-04-02 DIAGNOSIS — Z1210 Encounter for screening for malignant neoplasm of intestinal tract, unspecified: Secondary | ICD-10-CM | POA: Diagnosis not present

## 2018-07-09 IMAGING — CT CT SHOULDER*L* W/CM
1 of 3 series · 3 of 14 positions shown, 4 images · non-contrast
Comparison: MRI of left shoulder 09/26/2015

CLINICAL DATA: Left shoulder pain.  Interval rotator cuff surgery.

EXAM:
CT ARTHROGRAPHY OF THE LEFT SHOULDER
TECHNIQUE: Multidetector CT imaging was performed following the standard
protocol after injection of dilute contrast into the joint.

[Series 7: cor st · oblique · 0.37mm/px · 3 of 116 slices shown, 4 images]
[im 29/116  soft-tissue]
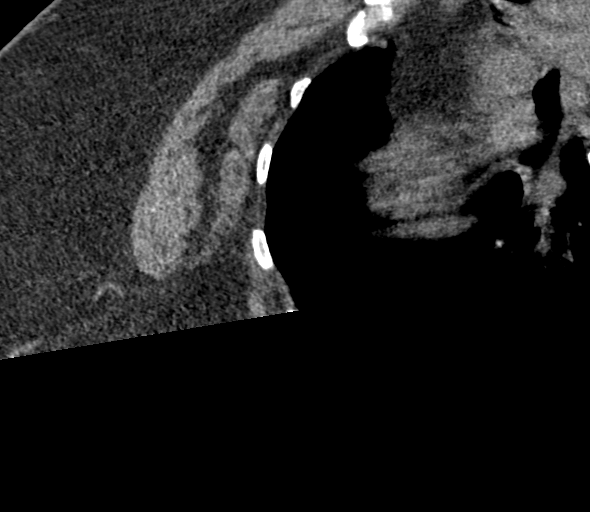
[im 29/116  bone]
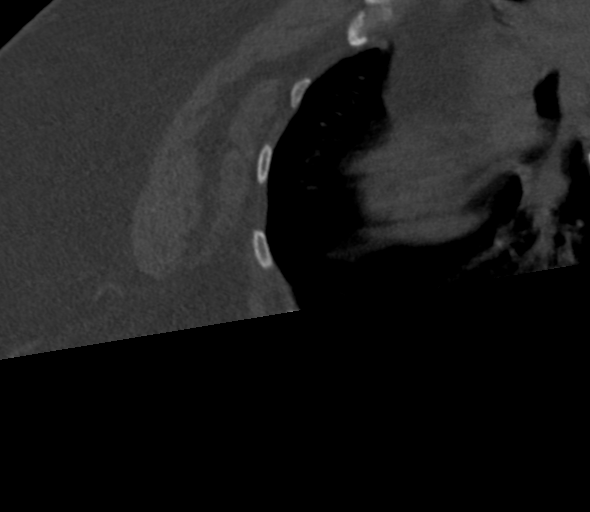
[im 58/116  bone]
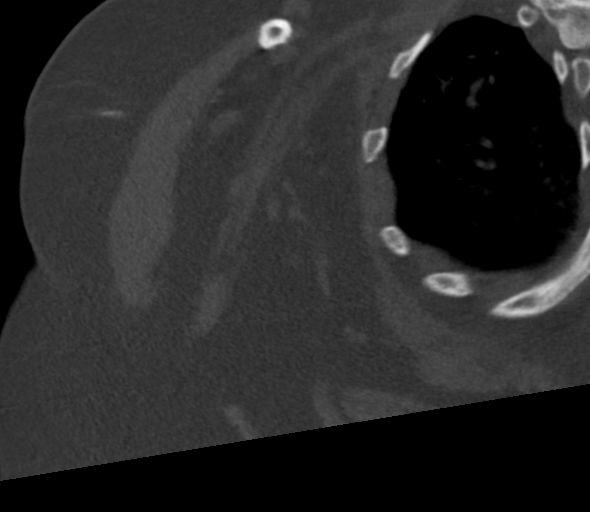
[im 87/116  bone]
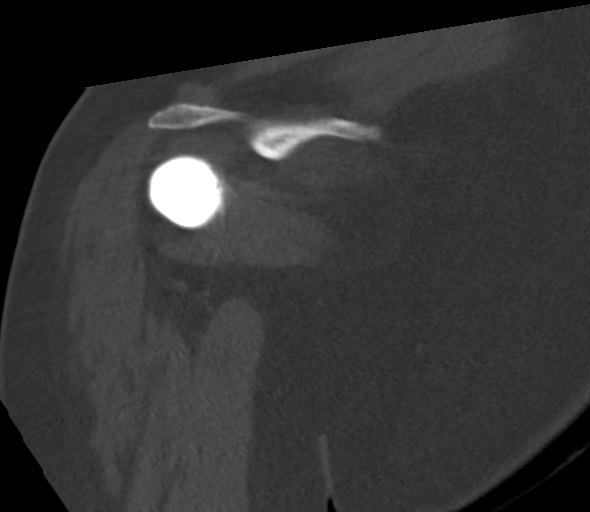

[3 of 14 positions shown; findings below may reference images not displayed]

FINDINGS: Rotator cuff: Interval rotator cuff repair with surgical anchors in
the humeral head. Small partial-thickness articular surface tear of
the supraspinatus tendon. Infraspinatus tendon is intact. Teres
minor tendon is intact. Subscapularis tendon is intact.

Muscles: No atrophy or fatty replacement of nor abnormal signal
within, the muscles of the rotator cuff.

Biceps Long Head: Interval tenotomy.

Acromioclavicular Joint: Mild degenerative changes of the
acromioclavicular joint. Type I acromion. No subacromial/subdeltoid
bursal contrast.

Glenohumeral Joint: Intraarticular contrast distending the joint
capsule. Normal glenohumeral ligaments. No osteochondral defect.

Labrum: Intact.

Bones: No fracture or dislocation. No lytic or sclerotic osseous
lesion. Normal alignment.

Other:  Visualized left lung demonstrates no focal abnormality.
IMPRESSION: 1. Interval rotator cuff repair with surgical anchors in the humeral
head. Small partial-thickness articular surface tear of the
supraspinatus tendon.
2. Subscapularis tendon is intact.
3. Interval tenotomy of the long head of the biceps tendon.

## 2019-04-02 ENCOUNTER — Other Ambulatory Visit: Payer: Self-pay | Admitting: Pediatrics

## 2019-04-02 ENCOUNTER — Ambulatory Visit
Admission: RE | Admit: 2019-04-02 | Discharge: 2019-04-02 | Disposition: A | Discharge status: Home / Self Care | Payer: Disability Insurance | Source: Ambulatory Visit | Attending: Pediatrics | Admitting: Pediatrics

## 2019-04-02 DIAGNOSIS — M170 Bilateral primary osteoarthritis of knee: Secondary | ICD-10-CM | POA: Diagnosis present

## 2019-04-02 DIAGNOSIS — M19012 Primary osteoarthritis, left shoulder: Secondary | ICD-10-CM | POA: Insufficient documentation

## 2019-04-02 DIAGNOSIS — M479 Spondylosis, unspecified: Secondary | ICD-10-CM
